# Patient Record
Sex: Female | Born: 1970 | Race: Black or African American | Hispanic: No | Marital: Single | Smoking: Never smoker
Health system: Southern US, Community
[De-identification: ages and names within clinical notes are randomized; demographics above are authoritative.]

## PROBLEM LIST (undated history)

## (undated) DIAGNOSIS — K219 Gastro-esophageal reflux disease without esophagitis: Secondary | ICD-10-CM

## (undated) DIAGNOSIS — I10 Essential (primary) hypertension: Secondary | ICD-10-CM

## (undated) DIAGNOSIS — R51 Headache: Secondary | ICD-10-CM

## (undated) DIAGNOSIS — K449 Diaphragmatic hernia without obstruction or gangrene: Secondary | ICD-10-CM

## (undated) DIAGNOSIS — N6041 Mammary duct ectasia of right breast: Secondary | ICD-10-CM

## (undated) DIAGNOSIS — F329 Major depressive disorder, single episode, unspecified: Secondary | ICD-10-CM

## (undated) DIAGNOSIS — B019 Varicella without complication: Secondary | ICD-10-CM

## (undated) DIAGNOSIS — R7989 Other specified abnormal findings of blood chemistry: Secondary | ICD-10-CM

## (undated) DIAGNOSIS — F32A Depression, unspecified: Secondary | ICD-10-CM

## (undated) DIAGNOSIS — F419 Anxiety disorder, unspecified: Secondary | ICD-10-CM

## (undated) DIAGNOSIS — E229 Hyperfunction of pituitary gland, unspecified: Secondary | ICD-10-CM

## (undated) DIAGNOSIS — D509 Iron deficiency anemia, unspecified: Secondary | ICD-10-CM

## (undated) DIAGNOSIS — E785 Hyperlipidemia, unspecified: Secondary | ICD-10-CM

## (undated) DIAGNOSIS — G4730 Sleep apnea, unspecified: Secondary | ICD-10-CM

## (undated) HISTORY — DX: Mammary duct ectasia of right breast: N60.41

## (undated) HISTORY — DX: Morbid (severe) obesity due to excess calories: E66.01

## (undated) HISTORY — PX: BLADDER SURGERY: SHX569

## (undated) HISTORY — DX: Essential (primary) hypertension: I10

## (undated) HISTORY — DX: Hyperlipidemia, unspecified: E78.5

## (undated) HISTORY — DX: Sleep apnea, unspecified: G47.30

## (undated) HISTORY — DX: Depression, unspecified: F32.A

## (undated) HISTORY — DX: Anxiety disorder, unspecified: F41.9

## (undated) HISTORY — DX: Iron deficiency anemia, unspecified: D50.9

## (undated) HISTORY — PX: MOUTH SURGERY: SHX715

## (undated) HISTORY — DX: Diaphragmatic hernia without obstruction or gangrene: K44.9

## (undated) HISTORY — DX: Other specified abnormal findings of blood chemistry: R79.89

## (undated) HISTORY — DX: Varicella without complication: B01.9

## (undated) HISTORY — DX: Headache: R51

## (undated) HISTORY — DX: Gastro-esophageal reflux disease without esophagitis: K21.9

## (undated) HISTORY — DX: Hyperfunction of pituitary gland, unspecified: E22.9

## (undated) HISTORY — DX: Major depressive disorder, single episode, unspecified: F32.9

---

## 1999-10-06 ENCOUNTER — Other Ambulatory Visit: Admission: RE | Admit: 1999-10-06 | Discharge: 1999-10-06 | Payer: Self-pay | Admitting: Family Medicine

## 2001-09-14 ENCOUNTER — Other Ambulatory Visit: Admission: RE | Admit: 2001-09-14 | Discharge: 2001-09-14 | Payer: Self-pay | Admitting: Obstetrics and Gynecology

## 2002-09-19 ENCOUNTER — Other Ambulatory Visit: Admission: RE | Admit: 2002-09-19 | Discharge: 2002-09-19 | Payer: Self-pay | Admitting: Obstetrics and Gynecology

## 2003-09-25 ENCOUNTER — Other Ambulatory Visit: Admission: RE | Admit: 2003-09-25 | Discharge: 2003-09-25 | Payer: Self-pay | Admitting: Obstetrics and Gynecology

## 2003-11-23 ENCOUNTER — Encounter: Payer: Self-pay | Admitting: Cardiology

## 2004-01-23 ENCOUNTER — Ambulatory Visit (HOSPITAL_BASED_OUTPATIENT_CLINIC_OR_DEPARTMENT_OTHER): Admission: RE | Admit: 2004-01-23 | Discharge: 2004-01-23 | Payer: Self-pay | Admitting: Internal Medicine

## 2004-02-08 ENCOUNTER — Encounter: Admission: RE | Admit: 2004-02-08 | Discharge: 2004-05-08 | Payer: Self-pay | Admitting: Internal Medicine

## 2004-11-14 ENCOUNTER — Other Ambulatory Visit: Admission: RE | Admit: 2004-11-14 | Discharge: 2004-11-14 | Payer: Self-pay | Admitting: Obstetrics and Gynecology

## 2004-12-18 ENCOUNTER — Ambulatory Visit: Payer: Self-pay | Admitting: Internal Medicine

## 2005-04-08 ENCOUNTER — Ambulatory Visit: Payer: Self-pay | Admitting: Internal Medicine

## 2005-04-09 ENCOUNTER — Ambulatory Visit: Payer: Self-pay | Admitting: Pulmonary Disease

## 2005-07-08 ENCOUNTER — Encounter: Payer: Self-pay | Admitting: Pulmonary Disease

## 2005-09-23 ENCOUNTER — Ambulatory Visit: Payer: Self-pay | Admitting: Internal Medicine

## 2005-10-07 ENCOUNTER — Ambulatory Visit: Payer: Self-pay | Admitting: Internal Medicine

## 2005-12-08 ENCOUNTER — Encounter: Payer: Self-pay | Admitting: Internal Medicine

## 2005-12-21 ENCOUNTER — Other Ambulatory Visit: Admission: RE | Admit: 2005-12-21 | Discharge: 2005-12-21 | Payer: Self-pay | Admitting: Obstetrics and Gynecology

## 2006-05-31 ENCOUNTER — Ambulatory Visit: Payer: Self-pay | Admitting: Internal Medicine

## 2006-06-17 ENCOUNTER — Ambulatory Visit: Payer: Self-pay | Admitting: Internal Medicine

## 2006-07-07 ENCOUNTER — Ambulatory Visit: Payer: Self-pay | Admitting: Internal Medicine

## 2006-08-30 ENCOUNTER — Ambulatory Visit: Payer: Self-pay | Admitting: Internal Medicine

## 2006-12-22 ENCOUNTER — Ambulatory Visit: Payer: Self-pay | Admitting: Internal Medicine

## 2006-12-23 ENCOUNTER — Ambulatory Visit: Payer: Self-pay | Admitting: Professional

## 2007-04-12 ENCOUNTER — Ambulatory Visit: Payer: Self-pay | Admitting: Internal Medicine

## 2007-04-12 ENCOUNTER — Observation Stay (HOSPITAL_COMMUNITY): Admission: AD | Admit: 2007-04-12 | Discharge: 2007-04-13 | Payer: Self-pay | Admitting: Internal Medicine

## 2007-04-22 ENCOUNTER — Ambulatory Visit: Payer: Self-pay | Admitting: Internal Medicine

## 2007-07-21 ENCOUNTER — Ambulatory Visit: Payer: Self-pay | Admitting: Internal Medicine

## 2007-10-06 ENCOUNTER — Encounter: Payer: Self-pay | Admitting: Internal Medicine

## 2007-10-06 ENCOUNTER — Ambulatory Visit: Payer: Self-pay | Admitting: Internal Medicine

## 2007-10-06 DIAGNOSIS — F329 Major depressive disorder, single episode, unspecified: Secondary | ICD-10-CM

## 2007-10-06 DIAGNOSIS — R51 Headache: Secondary | ICD-10-CM

## 2007-10-06 DIAGNOSIS — E785 Hyperlipidemia, unspecified: Secondary | ICD-10-CM | POA: Insufficient documentation

## 2007-10-06 DIAGNOSIS — R519 Headache, unspecified: Secondary | ICD-10-CM | POA: Insufficient documentation

## 2007-10-06 DIAGNOSIS — F411 Generalized anxiety disorder: Secondary | ICD-10-CM | POA: Insufficient documentation

## 2007-10-06 DIAGNOSIS — I10 Essential (primary) hypertension: Secondary | ICD-10-CM

## 2007-10-06 LAB — CONVERTED CEMR LAB
AST: 26 units/L (ref 0–37)
Albumin: 3.8 g/dL (ref 3.5–5.2)
BUN: 11 mg/dL (ref 6–23)
Basophils Relative: 0.7 % (ref 0.0–1.0)
Bilirubin Urine: NEGATIVE
Bilirubin, Direct: 0.1 mg/dL (ref 0.0–0.3)
Calcium: 10.1 mg/dL (ref 8.4–10.5)
Creatinine, Ser: 1 mg/dL (ref 0.4–1.2)
Eosinophils Relative: 3.4 % (ref 0.0–5.0)
HCT: 37.3 % (ref 36.0–46.0)
Ketones, ur: NEGATIVE mg/dL
Lymphocytes Relative: 41.4 % (ref 12.0–46.0)
MCHC: 33.7 g/dL (ref 30.0–36.0)
Monocytes Relative: 8.9 % (ref 3.0–11.0)
Neutro Abs: 2.1 10*3/uL (ref 1.4–7.7)
Neutrophils Relative %: 45.6 % (ref 43.0–77.0)
Nitrite: NEGATIVE
Potassium: 3.7 meq/L (ref 3.5–5.1)
RDW: 13.8 % (ref 11.5–14.6)
Sodium: 137 meq/L (ref 135–145)
TSH: 2.7 microintl units/mL (ref 0.35–5.50)
Total Protein, Urine: NEGATIVE mg/dL
Triglycerides: 56 mg/dL (ref 0–149)
Urobilinogen, UA: 0.2 (ref 0.0–1.0)
VLDL: 11 mg/dL (ref 0–40)
WBC: 4.4 10*3/uL — ABNORMAL LOW (ref 4.5–10.5)

## 2007-10-14 ENCOUNTER — Other Ambulatory Visit: Admission: RE | Admit: 2007-10-14 | Discharge: 2007-10-14 | Payer: Self-pay | Admitting: Internal Medicine

## 2007-10-14 ENCOUNTER — Ambulatory Visit: Payer: Self-pay | Admitting: Internal Medicine

## 2007-10-14 ENCOUNTER — Telehealth: Payer: Self-pay | Admitting: Internal Medicine

## 2007-10-14 ENCOUNTER — Encounter: Payer: Self-pay | Admitting: Internal Medicine

## 2007-10-21 ENCOUNTER — Encounter: Payer: Self-pay | Admitting: Internal Medicine

## 2007-12-08 DIAGNOSIS — N6041 Mammary duct ectasia of right breast: Secondary | ICD-10-CM

## 2007-12-08 HISTORY — DX: Mammary duct ectasia of right breast: N60.41

## 2008-05-08 ENCOUNTER — Ambulatory Visit: Payer: Self-pay | Admitting: Internal Medicine

## 2008-05-08 DIAGNOSIS — B029 Zoster without complications: Secondary | ICD-10-CM | POA: Insufficient documentation

## 2008-05-14 ENCOUNTER — Telehealth: Payer: Self-pay | Admitting: Internal Medicine

## 2008-10-01 ENCOUNTER — Telehealth: Payer: Self-pay | Admitting: Internal Medicine

## 2008-10-03 ENCOUNTER — Ambulatory Visit: Payer: Self-pay | Admitting: Internal Medicine

## 2008-10-04 ENCOUNTER — Ambulatory Visit: Payer: Self-pay | Admitting: Internal Medicine

## 2008-10-05 ENCOUNTER — Telehealth: Payer: Self-pay | Admitting: Internal Medicine

## 2008-10-09 ENCOUNTER — Telehealth: Payer: Self-pay | Admitting: Internal Medicine

## 2008-10-11 ENCOUNTER — Ambulatory Visit: Payer: Self-pay | Admitting: Internal Medicine

## 2008-10-11 LAB — CONVERTED CEMR LAB
BUN: 18 mg/dL (ref 6–23)
Calcium: 9.8 mg/dL (ref 8.4–10.5)
Chloride: 103 meq/L (ref 96–112)
Creatinine, Ser: 0.9 mg/dL (ref 0.4–1.2)
GFR calc non Af Amer: 76 mL/min
Glucose, Bld: 92 mg/dL (ref 70–99)

## 2008-10-14 ENCOUNTER — Encounter: Admission: RE | Admit: 2008-10-14 | Discharge: 2008-10-14 | Payer: Self-pay | Admitting: Internal Medicine

## 2008-10-16 ENCOUNTER — Telehealth: Payer: Self-pay | Admitting: Internal Medicine

## 2008-10-16 ENCOUNTER — Encounter: Admission: RE | Admit: 2008-10-16 | Discharge: 2008-10-16 | Payer: Self-pay | Admitting: Internal Medicine

## 2008-10-16 LAB — HM MAMMOGRAPHY

## 2008-11-05 ENCOUNTER — Encounter: Payer: Self-pay | Admitting: Internal Medicine

## 2008-12-04 ENCOUNTER — Telehealth: Payer: Self-pay | Admitting: Internal Medicine

## 2008-12-07 HISTORY — PX: OTHER SURGICAL HISTORY: SHX169

## 2008-12-10 ENCOUNTER — Ambulatory Visit: Payer: Self-pay | Admitting: Internal Medicine

## 2008-12-10 ENCOUNTER — Encounter: Payer: Self-pay | Admitting: Internal Medicine

## 2008-12-10 ENCOUNTER — Other Ambulatory Visit: Admission: RE | Admit: 2008-12-10 | Discharge: 2008-12-10 | Payer: Self-pay | Admitting: Internal Medicine

## 2008-12-17 ENCOUNTER — Telehealth: Payer: Self-pay | Admitting: Internal Medicine

## 2008-12-17 ENCOUNTER — Ambulatory Visit: Payer: Self-pay | Admitting: Internal Medicine

## 2008-12-26 ENCOUNTER — Encounter: Admission: RE | Admit: 2008-12-26 | Discharge: 2008-12-26 | Payer: Self-pay | Admitting: Surgery

## 2008-12-28 ENCOUNTER — Encounter (INDEPENDENT_AMBULATORY_CARE_PROVIDER_SITE_OTHER): Payer: Self-pay | Admitting: Surgery

## 2008-12-28 ENCOUNTER — Ambulatory Visit (HOSPITAL_BASED_OUTPATIENT_CLINIC_OR_DEPARTMENT_OTHER): Admission: RE | Admit: 2008-12-28 | Discharge: 2008-12-28 | Payer: Self-pay | Admitting: Surgery

## 2008-12-31 ENCOUNTER — Telehealth: Payer: Self-pay | Admitting: Internal Medicine

## 2009-02-22 ENCOUNTER — Ambulatory Visit: Payer: Self-pay | Admitting: Internal Medicine

## 2009-02-22 LAB — CONVERTED CEMR LAB
ALT: 24 units/L (ref 0–35)
AST: 28 units/L (ref 0–37)
Alkaline Phosphatase: 62 units/L (ref 39–117)
BUN: 8 mg/dL (ref 6–23)
CO2: 33 meq/L — ABNORMAL HIGH (ref 19–32)
Calcium: 9.9 mg/dL (ref 8.4–10.5)
Creatinine, Ser: 1 mg/dL (ref 0.4–1.2)
Eosinophils Relative: 4.8 % (ref 0.0–5.0)
Hemoglobin, Urine: NEGATIVE
Hemoglobin: 12.7 g/dL (ref 12.0–15.0)
Hgb A1c MFr Bld: 5.8 % (ref 4.6–6.5)
Leukocytes, UA: NEGATIVE
Lymphocytes Relative: 33.9 % (ref 12.0–46.0)
MCHC: 34.8 g/dL (ref 30.0–36.0)
MCV: 87.7 fL (ref 78.0–100.0)
Monocytes Absolute: 0.4 10*3/uL (ref 0.1–1.0)
Monocytes Relative: 8.9 % (ref 3.0–12.0)
Nitrite: NEGATIVE
Potassium: 3.8 meq/L (ref 3.5–5.1)
RBC: 4.15 M/uL (ref 3.87–5.11)
RDW: 13.7 % (ref 11.5–14.6)
Sodium: 140 meq/L (ref 135–145)
pH: 7 (ref 5.0–8.0)

## 2009-02-23 ENCOUNTER — Encounter: Payer: Self-pay | Admitting: Internal Medicine

## 2009-12-12 ENCOUNTER — Ambulatory Visit: Payer: Self-pay | Admitting: Internal Medicine

## 2009-12-12 LAB — CONVERTED CEMR LAB
AST: 29 units/L (ref 0–37)
Alkaline Phosphatase: 53 units/L (ref 39–117)
Bilirubin, Direct: 0.1 mg/dL (ref 0.0–0.3)
HDL: 71.7 mg/dL (ref >39.00)
Total Bilirubin: 0.6 mg/dL (ref 0.3–1.2)
Total Protein: 7.6 g/dL (ref 6.0–8.3)
Triglycerides: 66 mg/dL (ref 0.0–149.0)

## 2009-12-30 ENCOUNTER — Telehealth: Payer: Self-pay | Admitting: Internal Medicine

## 2009-12-31 ENCOUNTER — Encounter: Payer: Self-pay | Admitting: Internal Medicine

## 2009-12-31 ENCOUNTER — Telehealth: Payer: Self-pay | Admitting: Internal Medicine

## 2010-01-15 ENCOUNTER — Encounter: Payer: Self-pay | Admitting: Internal Medicine

## 2010-01-23 ENCOUNTER — Ambulatory Visit (HOSPITAL_COMMUNITY): Admission: RE | Admit: 2010-01-23 | Discharge: 2010-01-23 | Payer: Self-pay | Admitting: General Surgery

## 2010-02-10 ENCOUNTER — Ambulatory Visit: Payer: Self-pay | Admitting: Internal Medicine

## 2010-02-13 ENCOUNTER — Encounter: Payer: Self-pay | Admitting: Internal Medicine

## 2010-03-03 ENCOUNTER — Encounter: Admission: RE | Admit: 2010-03-03 | Discharge: 2010-06-01 | Payer: Self-pay | Admitting: General Surgery

## 2010-03-04 ENCOUNTER — Ambulatory Visit: Payer: Self-pay | Admitting: Internal Medicine

## 2010-03-04 ENCOUNTER — Other Ambulatory Visit: Admission: RE | Admit: 2010-03-04 | Discharge: 2010-03-04 | Payer: Self-pay | Admitting: Internal Medicine

## 2010-03-04 LAB — CONVERTED CEMR LAB: Pap Smear: NEGATIVE

## 2010-03-17 ENCOUNTER — Telehealth: Payer: Self-pay | Admitting: Internal Medicine

## 2010-03-21 ENCOUNTER — Encounter: Payer: Self-pay | Admitting: Internal Medicine

## 2010-04-06 HISTORY — PX: OTHER SURGICAL HISTORY: SHX169

## 2010-04-14 ENCOUNTER — Telehealth: Payer: Self-pay | Admitting: Internal Medicine

## 2010-04-15 ENCOUNTER — Ambulatory Visit (HOSPITAL_COMMUNITY): Admission: RE | Admit: 2010-04-15 | Discharge: 2010-04-16 | Payer: Self-pay | Admitting: Surgery

## 2010-05-20 ENCOUNTER — Encounter: Payer: Self-pay | Admitting: Internal Medicine

## 2010-06-17 ENCOUNTER — Encounter: Admission: RE | Admit: 2010-06-17 | Discharge: 2010-08-18 | Payer: Self-pay | Admitting: General Surgery

## 2010-07-07 ENCOUNTER — Ambulatory Visit: Payer: Self-pay | Admitting: Internal Medicine

## 2010-08-18 ENCOUNTER — Ambulatory Visit: Payer: Self-pay | Admitting: Internal Medicine

## 2010-08-18 LAB — CONVERTED CEMR LAB
Cholesterol: 166 mg/dL
HDL: 60.5 mg/dL
LDL Cholesterol: 95 mg/dL
Total CHOL/HDL Ratio: 3
Triglycerides: 51 mg/dL
VLDL: 10.2 mg/dL

## 2010-08-19 ENCOUNTER — Encounter: Payer: Self-pay | Admitting: Internal Medicine

## 2010-09-14 ENCOUNTER — Emergency Department (HOSPITAL_COMMUNITY): Admission: EM | Admit: 2010-09-14 | Discharge: 2010-09-14 | Payer: Self-pay | Admitting: Emergency Medicine

## 2010-11-17 ENCOUNTER — Encounter
Admission: RE | Admit: 2010-11-17 | Discharge: 2011-01-06 | Payer: Self-pay | Source: Home / Self Care | Attending: General Surgery | Admitting: General Surgery

## 2010-12-11 ENCOUNTER — Encounter: Payer: Self-pay | Admitting: Internal Medicine

## 2010-12-29 ENCOUNTER — Other Ambulatory Visit: Payer: Self-pay | Admitting: Internal Medicine

## 2010-12-29 ENCOUNTER — Ambulatory Visit
Admission: RE | Admit: 2010-12-29 | Discharge: 2010-12-29 | Payer: Self-pay | Source: Home / Self Care | Attending: Internal Medicine | Admitting: Internal Medicine

## 2010-12-29 LAB — CBC WITH DIFFERENTIAL/PLATELET
Basophils Absolute: 0 10*3/uL (ref 0.0–0.1)
Basophils Relative: 0.3 % (ref 0.0–3.0)
Eosinophils Absolute: 0.1 10*3/uL (ref 0.0–0.7)
HCT: 35.2 % — ABNORMAL LOW (ref 36.0–46.0)
Lymphs Abs: 1.5 10*3/uL (ref 0.7–4.0)
MCV: 89.9 fl (ref 78.0–100.0)
Monocytes Relative: 8.1 % (ref 3.0–12.0)
Neutro Abs: 1.6 10*3/uL (ref 1.4–7.7)
Neutrophils Relative %: 46.7 % (ref 43.0–77.0)
Platelets: 171 10*3/uL (ref 150.0–400.0)
RBC: 3.91 Mil/uL (ref 3.87–5.11)
WBC: 3.5 10*3/uL — ABNORMAL LOW (ref 4.5–10.5)

## 2010-12-29 LAB — URINALYSIS
Ketones, ur: NEGATIVE
Nitrite: NEGATIVE
Specific Gravity, Urine: >=1.03 (ref 1.000–1.030)
Total Protein, Urine: NEGATIVE
Urine Glucose: NEGATIVE
pH: 5.5 (ref 5.0–8.0)

## 2010-12-29 LAB — HEPATIC FUNCTION PANEL
ALT: 15 U/L (ref 0–35)
Albumin: 3.7 g/dL (ref 3.5–5.2)
Alkaline Phosphatase: 36 U/L — ABNORMAL LOW (ref 39–117)
Bilirubin, Direct: 0.1 mg/dL (ref 0.0–0.3)
Total Bilirubin: 0.6 mg/dL (ref 0.3–1.2)
Total Protein: 6.3 g/dL (ref 6.0–8.3)

## 2010-12-29 LAB — LIPID PANEL
Cholesterol: 189 mg/dL (ref 0–200)
HDL: 69.4 mg/dL (ref >39.00)
Total CHOL/HDL Ratio: 3
Triglycerides: 26 mg/dL (ref 0.0–149.0)

## 2010-12-29 LAB — BASIC METABOLIC PANEL
BUN: 16 mg/dL (ref 6–23)
Calcium: 10.3 mg/dL (ref 8.4–10.5)
GFR: 92.86 mL/min (ref >60.00)
Glucose, Bld: 84 mg/dL (ref 70–99)
Potassium: 3.8 mEq/L (ref 3.5–5.1)

## 2010-12-29 LAB — TSH: TSH: 1.52 u[IU]/mL (ref 0.35–5.50)

## 2011-01-01 ENCOUNTER — Ambulatory Visit
Admission: RE | Admit: 2011-01-01 | Discharge: 2011-01-01 | Payer: Self-pay | Source: Home / Self Care | Attending: Internal Medicine | Admitting: Internal Medicine

## 2011-01-01 LAB — CONVERTED CEMR LAB
Cholesterol, target level: 200 mg/dL
LDL Goal: 160 mg/dL

## 2011-01-04 LAB — CONVERTED CEMR LAB

## 2011-01-06 NOTE — Letter (Signed)
   [facility] [address] [place], [place]  [address] Phone: [phone]      August 19, 2010   [facility] [address] [place], [place] [address]  RE:  LAB RESULTS  Dear  Ms. NAAS,  The following is an interpretation of your most recent lab tests.  Please take note of any instructions provided or changes to medications that have resulted from your lab work.  LIPID PANEL:  Good - no changes needed Triglyceride: 51.0   Cholesterol: 166   LDL: 95   HDL: 60.50   Chol/HDL%:  3     Great lipid panel even off medication. KEEP UP THE GOOD WORK!!!!   Sincerely Yours,    Jacques Navy MD

## 2011-01-06 NOTE — Assessment & Plan Note (Signed)
Summary: fu--d/t---stc   Vital Signs:  Patient profile:   40 year old female Height:      66 inches Weight:      274 pounds BMI:     44.38 O2 Sat:      97 % on Room air Temp:     98.3 degrees F oral Pulse rate:   67 / minute BP sitting:   112 / 64  (left arm) Cuff size:   large  Vitals Entered By: Bill Salinas CMA (July 07, 2010 3:07 PM)  O2 Flow:  Room air CC: follow-up visit, pt states she is taking 40mg  of the lexapro/ab   Primary Care Provider:  Nelly Scriven  CC:  follow-up visit and pt states she is taking 40mg  of the lexapro/ab.  History of Present Illness: Patient had lap-band surgery in May '11 and has lost 41 lbs since March. She is feeling well. she is interested to know if she can stop any medications. She has missed doses of reglan and done well. she has missed doses of prilosec and done OK.  she is currently taking lexapro 40mg  once daily but it is getting expensive. she is interested in trying generic celexa.  Current Medications (verified): 1)  Hydrochlorothiazide 25 Mg Tabs (Hydrochlorothiazide) .... Take 1 Tablet By Mouth Once A Day 2)  Lexapro 20 Mg Tabs (Escitalopram Oxalate) .Marland Kitchen.. 1 By Mouth Once Daily 3)  Lisinopril 10 Mg Tabs (Lisinopril) .... Take 1 Tablet By Mouth Once A Day 4)  Lovastatin 40 Mg Tabs (Lovastatin) .... Take 1 Tablet By Mouth Once A Day 5)  Phendimetrazine Tartrate 35 Mg Tabs (Phendimetrazine Tartrate) .... 2 in Am, 1 Pm 6)  Metoclopramide Hcl 10 Mg Tabs (Metoclopramide Hcl) .... Take 1 Tablet By Mouth Two Times A Day 7)  Prilosec Otc 20 Mg Tbec (Omeprazole Magnesium) .Marland Kitchen.. 1 By Mouth Qam 8)  Fluticasone Propionate 50 Mcg/act Susp (Fluticasone Propionate) .Marland Kitchen.. 1 Spray Each Nostril  Every Morning  Allergies (verified): 1)  * Contrast Dye  Past History:  Past Medical History: Last updated: 02/10/2010 chicken pox Anxiety Depression Headache Hyperlipidemia Hypertension morbid obesity  physician roster: Dr. Wall-cardiology  Past  Surgical History: bladder surgery - dhildhood lapband procedure May '11 ([facility]) PSH reviewed for relevance, FH reviewed for relevance  Family History: Reviewed history from 10/06/2007 and no changes required. mother-arthritis Paternal great-aunt with breast cancer. parents with HTN, lipids grandparent with DM  Social History: Reviewed history from 02/22/2009 and no changes required. 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  The patient denies anorexia, fever, weight gain, hoarseness, chest pain, abdominal pain, severe indigestion/heartburn, and muscle weakness.    Physical Exam  General:  0verweight but much reduced AA female in no distress Head:  Normocephalic and atraumatic without obvious abnormalities. No apparent alopecia or balding. Eyes:  pupils equal, pupils round, corneas and lenses clear, and no optic disk abnormalities.   Ears:  R ear normal and L ear normal.   Mouth:  good dentition.   Neck:  supple, full ROM, no thyromegaly, and no carotid bruits.   Lungs:  Normal respiratory effort, chest expands symmetrically. Lungs are clear to auscultation, no crackles or wheezes. Heart:  Normal rate and regular rhythm. S1 and S2 normal without gallop, murmur, click, rub or other extra sounds. Abdomen:  soft, non-tender, and  normal bowel sounds.  WEell healed surgical scars. Port is stable.  Msk:  normal ROM and no joint tenderness.   Pulses:  2+ radial pulse Neurologic:  alert & oriented X3, cranial nerves II-XII intact, strength normal in all extremities, sensation intact to light touch, and gait normal.     Impression & Recommendations:  Problem # 1:  MORBID OBESITY (ICD-278.01) Patient s/p lap band procedure with 41 lb weight loss since surgery in May.  Plan - continue to watch diet  Problem  # 2:  HYPERTENSION (ICD-401.9) Patinets BP is much better controlled.  Plan - continue HCTZ           d/c lisinopril           f/u BP   The following medications were removed from the medication list:    Lisinopril 10 Mg Tabs (Lisinopril) .Marland Kitchen... Take 1 tablet by mouth once a day Her updated medication list for this problem includes:    Hydrochlorothiazide 25 Mg Tabs (Hydrochlorothiazide) .Marland Kitchen... Take 1 tablet by mouth once a day  Problem # 3:  HYPERLIPIDEMIA (ICD-272.4)  Patient cholesterol level is great!   The following medications were removed from the medication list:    Lovastatin 40 Mg Tabs (Lovastatin) .Marland Kitchen... Take 1 tablet by mouth once a day  Labs Reviewed: SGOT: 29 (12/12/2009)   SGPT: 21 (12/12/2009)   HDL:71.70 (12/12/2009), 64.3 (10/06/2007)  LDL:80 (12/12/2009), 84 (10/06/2007)  Chol:165 (12/12/2009), 159 (10/06/2007)  Trig:66.0 (12/12/2009), 56 (10/06/2007)  Plan - trial off lovastatin           f/u lab in 4 weeks   Problem # 4:  DEPRESSION (ICD-311) Patient seems to be doing well.  Plan - change to citalopram 40mg  daily.  Her updated medication list for this problem includes:    Citalopram Hydrobromide 40 Mg Tabs (Citalopram hydrobromide) .Marland Kitchen... 1 by mouth once daily  Complete Medication List: 1)  Hydrochlorothiazide 25 Mg Tabs (Hydrochlorothiazide) .... Take 1 tablet by mouth once a day 2)  Citalopram Hydrobromide 40 Mg Tabs (Citalopram hydrobromide) .Marland Kitchen.. 1 by mouth once daily 3)  Prilosec Otc 20 Mg Tbec (Omeprazole magnesium) .Marland Kitchen.. 1 by mouth qam as needed 4)  Fluticasone Propionate 50 Mcg/act Susp (Fluticasone propionate) .Marland Kitchen.. 1 spray each nostril  every morning  Patient Instructions: 1)  weight manage - STRONG WORK. KEEP IT UP 2)  Blood pressure - much better. OK to hold lisinopril. Continue HCTZ 3)  Cholesterol management - hold the lovastatin. Repeat lab in 4 weeks 4)  Psychologic - will convert to citalopram 40mg  once daily  Prescriptions: CITALOPRAM  HYDROBROMIDE 40 MG TABS (CITALOPRAM HYDROBROMIDE) 1 by mouth once daily  #30 x 12   Entered and Authorized by:   Jacques Navy MD   Signed by:   Jacques Navy MD on 07/08/2010   Method used:   [facility] [address] [phone]* (retail)       [address]       [place], [place]  [address]       Ph: [phone] or [phone]       Fax: [phone]   RxID:   [id number]

## 2011-01-06 NOTE — Letter (Signed)
Summary: [facility]   Imported By: Sherian Rein 02/03/2010 12:00:26  _____________________________________________________________________  External Attachment:    Type:   Image     Comment:   External Document

## 2011-01-06 NOTE — Progress Notes (Signed)
Summary: LEXAPRO INCREASE  Phone Note Call from Patient Call back at [facility] Phone [phone]   Summary of Call: At last office visit pt says MD agreed she could take increased dose of lexapro, 20mg  daily. She is [facility] [address]   Pt only needs return call if this is a problem. Initial call taken by: Lamar Sprinkles, CMA,  March 17, 2010 4:50 PM  Follow-up for Phone Call        ok to Rx lexapro 20mg  1 once daily, #30  refill x 12 Follow-up by: Jacques Navy MD,  March 18, 2010 5:46 AM  Additional Follow-up for Phone Call Additional follow up Details #1::        You wrote lexapro but in EMR put celexa? Pt wants Lexapro 20, ok?  Additional Follow-up by: Lamar Sprinkles, CMA,  March 18, 2010 7:59 AM    Additional Follow-up for Phone Call Additional follow up Details #2::    When that is entered the state health plan requires substitution of citalopram. Lexapro is escitalopram. If she is willing to pay for brand name then it will be lexapro 20 Follow-up by: Jacques Navy MD,  March 18, 2010 8:32 AM  Additional Follow-up for Phone Call Additional follow up Details #3:: Details for Additional Follow-up Action Taken: Pt has never tried celexa. She says insurance covers lexapro w/o any problems. Will send in rx for lexapro, pt aware if this is not covered then must try celexa. Additional Follow-up by: Lamar Sprinkles, CMA,  March 18, 2010 8:49 AM  New/Updated Medications: CITALOPRAM HYDROBROMIDE 20 MG TABS (CITALOPRAM HYDROBROMIDE) 1 by mouth q d LEXAPRO 20 MG TABS (ESCITALOPRAM OXALATE) 1 by mouth once daily Prescriptions: LEXAPRO 20 MG TABS (ESCITALOPRAM OXALATE) 1 by mouth once daily  #90 x 1   Entered by:   Lamar Sprinkles, CMA   Authorized by:   Jacques Navy MD   Signed by:   Lamar Sprinkles, CMA on 03/18/2010   Method used:   [facility]        [facility]  [address] [phone]* (retail)       [address]       [place], [place]  [id number]       Ph: [phone] or [phone]       Fax: [phone]   RxID:   [id number]

## 2011-01-06 NOTE — Assessment & Plan Note (Signed)
Summary: PHYSICAL--STC   Vital Signs:  Patient profile:   40 year old female Height:      66 inches Weight:      300 pounds BMI:     48.60 O2 Sat:      96 % on Room air Temp:     98.4 degrees F oral Pulse rate:   104 / minute Resp:     12 per minute BP sitting:   124 / 80  O2 Flow:  Room air  Primary Care Provider:  Angelys Tucker   History of Present Illness: Patient presents for routine exam.  In the interval since her last visit she has had a persistent sinus infection requiring antibiotics; she had a benign fibroma excised from the left breast; a gum infection rquiring oral surgery; C. Difficile colitis. Through all this she has been going to the Bariatric clinic where she has been coached on weight loss and prescribed phentermine. She has continued to workout.She did loose 40lbs but with the intercurrent illness she did gain it all back. At this time she is interested in persuing lap-band surgery and is signed up for the Jan 11th educational session at [facility].    Allergies: 1)  * Contrast Dye  Past History:  Past Medical History: Last updated: 02/22/2009 chicken pox Anxiety Depression Headache Hyperlipidemia Hypertension morbid obesity physician roster: Dr. Wall-cardiology  Past Surgical History: Last updated: 10/06/2007 bladder surgery - dhildhood  Family History: Last updated: 10/06/2007 mother-arthritis Paternal great-aunt with breast cancer. 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  The patient denies anorexia, fever, weight loss, decreased  hearing, hoarseness, chest pain, dyspnea on exertion, peripheral edema, prolonged cough, hemoptysis, abdominal pain, hematochezia, hematuria, incontinence, muscle weakness, suspicious skin lesions, difficulty walking, unusual weight change, abnormal bleeding, and angioedema.    Physical Exam  General:  overweight AA female in no distress Head:  Normocephalic and atraumatic without obvious abnormalities. No apparent alopecia or balding. Eyes:  No corneal or conjunctival inflammation noted. EOMI. Perrla. Funduscopic exam benign, without hemorrhages, exudates or papilledema. Vision grossly normal. Ears:  External ear exam shows no significant lesions or deformities.  Otoscopic examination reveals clear canals, tympanic membranes are intact bilaterally without bulging, retraction, inflammation or discharge. Hearing is grossly normal bilaterally. Nose:  External nasal examination shows no deformity or inflammation. Nasal mucosa are pink and moist without lesions or exudates. Mouth:  Oral mucosa and oropharynx without lesions or exudates.  Teeth in good repair. Neck:  supple, full ROM, no thyromegaly, and no carotid bruits.   Chest Wall:  no tenderness.   Breasts:  No mass, nodules, thickening, tenderness, bulging, retraction, inflamation, nipple discharge or skin changes noted.  Minor fibrocsystic changes both breasts noted Lungs:  Normal respiratory effort, chest expands symmetrically. Lungs are clear to auscultation, no crackles or wheezes. Heart:  Normal rate and regular rhythm. S1 and S2 normal without gallop, murmur, click, rub or other extra sounds. Abdomen:  soft, non-tender, normal bowel sounds, and no guarding.   Genitalia:  deferred Msk:  normal ROM, no joint tenderness, no joint swelling, no joint warmth, no redness over joints, and no joint  instability.   Pulses:  2+ radial and DP Extremities:  No clubbing, cyanosis, edema, or deformity noted with normal full range of motion of all joints.    Neurologic:  No cranial nerve deficits noted. Station and gait are normal. Plantar reflexes are down-going bilaterally. DTRs are symmetrical throughout. Sensory, motor and coordinative functions appear intact. Skin:  turgor normal, color normal, no rashes, no suspicious lesions, no purpura, and no ulcerations.   Cervical Nodes:  no anterior cervical adenopathy and no posterior cervical adenopathy.   Axillary Nodes:  no R axillary adenopathy and no L axillary adenopathy.   Psych:  Oriented X3, memory intact for recent and remote, normally interactive, good eye contact, and not anxious appearing.     Impression & Recommendations:  Problem # 1:  MORBID OBESITY (ICD-278.01) Patient is encouraged to attend [facility] information session and to pursue lap band surgery. She is a good surgical candidate with no contra-indications to surgery or anesthesia.  Problem # 2:  HYPERTENSION (ICD-401.9)  Her updated medication list for this problem includes:    Hydrochlorothiazide 25 Mg Tabs (Hydrochlorothiazide) .Marland Kitchen... Take 1 tablet by mouth once a day    Lisinopril 10 Mg Tabs (Lisinopril) .Marland Kitchen... Take 1 tablet by mouth once a day  BP today: 124/80 Prior BP: 116/82 (02/22/2009)  Good control. Plan is to continue present medications.   Problem # 3:  HYPERLIPIDEMIA (ICD-272.4)  Her updated medication list for this problem includes:    Lovastatin 40 Mg Tabs (Lovastatin) .Marland Kitchen... Take 1 tablet by mouth once a day  Orders: TLB-Lipid Panel (80061-LIPID) TLB-Hepatic/Liver Function Pnl (80076-HEPATIC)  Addendum - LDL 80, HDL 71.7 - excellent results.  Plan - continue present medications.   Problem # 4:  ANXIETY (ICD-300.00) Stable and doing well on present medications dispite an increased work load.  Her updated medication list for this problem includes:    Lexapro 10 Mg Tabs (Escitalopram oxalate) .Marland Kitchen... Take 1 tablet by mouth once a day  Problem # 5:  Preventive Health Care (ICD-V70.0) History as noted with  a good recovery from all the problems listed. Her exam today is normal including a normal breast exam. He lab works is excellent. She will be returning for Pelvic and PAP.  In summary - a pleasant woman who is determined to take action to control her weight problem.  Complete Medication List: 1)  Hydrochlorothiazide 25 Mg Tabs (Hydrochlorothiazide) .... Take 1 tablet by mouth once a day 2)  Lexapro 10 Mg Tabs (Escitalopram oxalate) .... Take 1 tablet by mouth once a day 3)  Lisinopril 10 Mg Tabs (Lisinopril) .... Take 1 tablet by mouth once a day 4)  Lovastatin 40 Mg Tabs (Lovastatin) .... Take 1 tablet by mouth once a day 5)  Phendimetrazine Tartrate 35 Mg Tabs (Phendimetrazine tartrate) .... 2 in am, 1 pm 6)  Metoclopramide Hcl 10 Mg Tabs (Metoclopramide hcl) .... Take 1 tablet by mouth two times a day 7)  Prilosec Otc 20 Mg Tbec (Omeprazole magnesium) .Marland Kitchen.. 1 by mouth qam  Patient: Katherine Tucker Note: All result statuses are Final unless otherwise noted.  Tests: (1) Lipid Panel (LIPID)   Cholesterol               165 mg/dL                   1-610     ATP III Classification            Desirable:  < 200 mg/dL  Borderline High:  200 - 239 mg/dL               High:  > = 240 mg/dL   Triglycerides             66.0 mg/dL                  1.6-109.6     Normal:  <150 mg/dL     Borderline High:  045 - 199 mg/dL   HDL                       40.98 mg/dL                 >11.91   VLDL Cholesterol          13.2 mg/dL                  4.7-82.9   LDL Cholesterol           80 mg/dL                    5-62  CHO/HDL Ratio:  CHD Risk                             2                    Men          Women     1/2 Average Risk     3.4          3.3     Average Risk          5.0          4.4     2X Average Risk          9.6          7.1     3X Average Risk          15.0          11.0                           Tests: (2) Hepatic/Liver Function Panel (HEPATIC)   Total Bilirubin            0.6 mg/dL                   1.3-0.8   Direct Bilirubin          0.1 mg/dL                   6.5-7.8   Alkaline Phosphatase      53 U/L                      39-117   AST                       29 U/L                      0-37   ALT                       21 U/L                      0-35   Total Protein  7.6 g/dL                    5.6-2.1   Albumin                   3.7 g/dL                    [id number]: PRILOSEC OTC 20 MG TBEC (OMEPRAZOLE MAGNESIUM) 1 by mouth qAM  #30 x 12   Entered and Authorized by:   Jacques Navy MD   Signed by:   Jacques Navy MD on 12/12/2009   Method used:   [facility]        [facility] [address] [phone]* (retail)       [address]       [place], [place]  [id number]       Ph: [phone] or [phone]       Fax: [phone]   RxID:   [id number] METOCLOPRAMIDE HCL 10 MG TABS (METOCLOPRAMIDE HCL) Take 1 tablet by mouth two times a day  #60 x PRN   Entered and Authorized by:   Jacques Navy MD   Signed by:   Jacques Navy MD on 12/12/2009   Method used:   Electronically to        [facility] [address] [phone]* (retail)       [address]       [place], [place]  [address]       Ph: [phone] or [phone]       Fax: [phone]   RxID:   [id number] LOVASTATIN 40 MG TABS (LOVASTATIN) Take 1 tablet by mouth once a day  #30 Tablet x 12   Entered and Authorized by:   Jacques Navy MD   Signed by:   Jacques Navy MD on 12/12/2009   Method used:   Electronically to        [facility] [address] [phone]* (retail)       [address]       [place], [place]  [address]       Ph: [phone] or [phone]       Fax: [phone]   RxID:   [id number] LISINOPRIL 10 MG TABS (LISINOPRIL) Take 1 tablet by mouth once a day  #30 Tablet x 5   Entered and Authorized by:   Jacques Navy MD   Signed by:   Jacques Navy MD  on 12/12/2009   Method used:   Electronically to        [facility]  [address] [phone]* (retail)       [address]       [place], [place]  [address]       Ph: [phone] or [phone]       Fax: [phone]   RxID:   [id number] LEXAPRO 10 MG TABS (ESCITALOPRAM OXALATE) Take 1 tablet by mouth once a day  #30 x 12   Entered and Authorized by:   Jacques Navy MD   Signed by:   Jacques Navy MD on 12/12/2009   Method used:   Electronically to        [facility]  [address] [phone]* (retail)       [address]       [place], [place]  [address]       Ph: [phone] or [phone]       Fax: [phone]   RxID:   [id number] HYDROCHLOROTHIAZIDE 25 MG TABS (HYDROCHLOROTHIAZIDE) Take 1 tablet by mouth once a day  #30 x 12   Entered and Authorized by:   Jacques Navy MD   Signed by:   Jacques Navy MD on 12/12/2009   Method used:   Electronically to        [facility] [address] [phone]* (retail)       [address]       [place], [place]  [address]       Ph: [phone] or [phone]       Fax: [phone]   RxID:   [id number]

## 2011-01-06 NOTE — Letter (Signed)
Summary: [facility]  [facility]   Imported By: Sherian Rein 02/24/2010 10:01:19  _____________________________________________________________________  External Attachment:    Type:   Image     Comment:   External Document

## 2011-01-06 NOTE — Letter (Signed)
Summary: [facility]   Imported By: Sherian Rein 06/04/2010 09:36:54  _____________________________________________________________________  External Attachment:    Type:   Image     Comment:   External Document

## 2011-01-06 NOTE — Progress Notes (Signed)
  Phone Note Other Incoming   Caller: pt Summary of Call: pt calling and states that she is having beariatric surg. in the morning at 7:15 at [facility], just an FYI Initial call taken by: Ami Bullins CMA,  Apr 14, 2010 11:10 AM

## 2011-01-06 NOTE — Progress Notes (Signed)
Summary: Paperwork  Phone Note Call from Patient Call back at [facility] Phone [phone]   Caller: Patient Call For: Jacques Navy MD Summary of Call: Patient dropped off a form to be completed by Dr. Debby Bud. Gave to [employer]. Initial call taken by: Irma Newness,  December 30, 2009 3:21 PM  Follow-up for Phone Call        form is complete and awaiting MD signature. **Will order chart to get more weights and look for sleep study. Follow-up by: Lucious Groves,  December 30, 2009 4:19 PM

## 2011-01-06 NOTE — Letter (Signed)
   [facility] [address] [place], [place]  [address] Phone: [phone]      March 21, 2010   [facility] [address] [place], [place] [address]  RE:  LAB RESULTS  Dear  Ms. ILLESCAS,  The following is an interpretation of your most recent lab tests.  Please take note of any instructions provided or changes to medications that have resulted from your lab work.  Pap Smear: normal      Normal study.   Sincerely Yours,    Jacques Navy MD

## 2011-01-06 NOTE — Assessment & Plan Note (Signed)
Summary: cold,cough,sore throat/men pt/cd   Vital Signs:  Patient profile:   40 year old female Height:      66 inches (167.64 cm) Weight:      300 pounds (136.36 kg) O2 Sat:      96 % on Room air Temp:     98.2 degrees F (36.78 degrees C) oral Pulse rate:   90 / minute BP sitting:   112 / 74  (left arm) Cuff size:   large  Vitals Entered By: Orlan Leavens (February 10, 2010 10:18 AM)  O2 Flow:  Room air CC: Head congestion/ cough/ sore throat x's 2 weeks, URI symptoms Is Patient Diabetic? No Pain Assessment Patient in pain? no        Primary Care Provider:  Norins  CC:  Head congestion/ cough/ sore throat x's 2 weeks and URI symptoms.  History of Present Illness:  URI Symptoms      This is a 40 year old woman who presents with URI symptoms.  The symptoms began 2 weeks ago.  The severity is described as moderate.  tried OTC mucinex, sudafed and benadryl with min relief. waxing and waning symptoms over past 2 weeks.  The patient reports nasal congestion, sore throat, earache, and sick contacts, but denies clear nasal discharge and productive cough.  Associated symptoms include low-grade fever (<100.5 degrees).  The patient denies stiff neck, dyspnea, vomiting, and use of an antipyretic.  The patient also reports itchy throat, response to antihistamine, headache, and severe fatigue.  The patient denies sneezing.  The patient denies the following risk factors for Strep sinusitis: tooth pain and tender adenopathy.    Current Medications (verified): 1)  Hydrochlorothiazide 25 Mg Tabs (Hydrochlorothiazide) .... Take 1 Tablet By Mouth Once A Day 2)  Lexapro 10 Mg Tabs (Escitalopram Oxalate) .... Take 1 Tablet By Mouth Once A Day 3)  Lisinopril 10 Mg Tabs (Lisinopril) .... Take 1 Tablet By Mouth Once A Day 4)  Lovastatin 40 Mg Tabs (Lovastatin) .... Take 1 Tablet By Mouth Once A Day 5)  Phendimetrazine Tartrate 35 Mg Tabs (Phendimetrazine Tartrate) .... 2 in Am, 1 Pm 6)  Metoclopramide Hcl  10 Mg Tabs (Metoclopramide Hcl) .... Take 1 Tablet By Mouth Two Times A Day 7)  Prilosec Otc 20 Mg Tbec (Omeprazole Magnesium) .Marland Kitchen.. 1 By Mouth Qam  Allergies (verified): 1)  * Contrast Dye  Past History:  Past Medical History: chicken pox Anxiety Depression Headache Hyperlipidemia Hypertension morbid obesity  physician roster: Dr. Wall-cardiology  Review of Systems  The patient denies syncope, hemoptysis, and abdominal pain.    Physical Exam  General:  overweight AA female in no distress- mildly ill with hoarse voice quality Eyes:  vision grossly intact; pupils equal, round and reactive to light.  conjunctiva and lids normal.    Ears:  R ear with clear mild effusion behind TM, L ear with mod effusion., hazy TM and erythema - tender with exam Nose:  External nasal examination shows no deformity or inflammation. Nasal mucosa are pink and moist without lesions or exudates. Mouth:  teeth and gums in good repair; mucous membranes moist, without lesions or ulcers. oropharynx clear without exudate, min erythema. +PND Neck:  supple, full ROM, no masses, no thyromegaly; no thyroid nodules or tenderness. no JVD or carotid bruits.   Lungs:  normal respiratory effort, no intercostal retractions or use of accessory muscles; normal breath sounds bilaterally - no crackles and no wheezes.    Heart:  normal rate, regular  rhythm, no murmur, and no rub. BLE without edema.    Impression & Recommendations:  Problem # 1:  SINUSITIS (ICD-473.9)  Her updated medication list for this problem includes:    Augmentin 875-125 Mg Tabs (Amoxicillin-pot clavulanate) .Marland Kitchen... 1 by mouth two times a day x 7days    Fluticasone Propionate 50 Mcg/act Susp (Fluticasone propionate) .Marland Kitchen... 1 spray each nostril  every morning  Take antibiotics for full duration. Discussed treatment options   Orders: Prescription Created Electronically [phone])  Problem # 2:  OTITIS MEDIA, LEFT (ICD-382.9)  Her updated  medication list for this problem includes:    Augmentin 875-125 Mg Tabs (Amoxicillin-pot clavulanate) .Marland Kitchen... 1 by mouth two times a day x 7days  Instructed on prevention and treatment. Call if no improvement in 48-72 hours or sooner if worsening symptoms.   Complete Medication List: 1)  Hydrochlorothiazide 25 Mg Tabs (Hydrochlorothiazide) .... Take 1 tablet by mouth once a day 2)  Lexapro 10 Mg Tabs (Escitalopram oxalate) .... Take 1 tablet by mouth once a day 3)  Lisinopril 10 Mg Tabs (Lisinopril) .... Take 1 tablet by mouth once a day 4)  Lovastatin 40 Mg Tabs (Lovastatin) .... Take 1 tablet by mouth once a day 5)  Phendimetrazine Tartrate 35 Mg Tabs (Phendimetrazine tartrate) .... 2 in am, 1 pm 6)  Metoclopramide Hcl 10 Mg Tabs (Metoclopramide hcl) .... Take 1 tablet by mouth two times a day 7)  Prilosec Otc 20 Mg Tbec (Omeprazole magnesium) .Marland Kitchen.. 1 by mouth qam 8)  Augmentin 875-125 Mg Tabs (Amoxicillin-pot clavulanate) .Marland Kitchen.. 1 by mouth two times a day x 7days 9)  Fluticasone Propionate 50 Mcg/act Susp (Fluticasone propionate) .Marland Kitchen.. 1 spray each nostril  every morning  Patient Instructions: 1)  it was good to see you today. 2)  antibiotics - augmentin and nasal steroid spray as discussed - your prescriptions have been electronically submitted to your pharmacy. Please take as directed. Contact our office if you believe you're having problems with the medication(s).  3)  Get plenty of rest, drink lots of clear liquids, and use Tylenol or Ibuprofen for fever and comfort. Return in 7-10 days if you're not better:sooner if you're feeling worse. 4)  Continue using Mucinex and Sudafed for other symptoms relief as you are doing Prescriptions: FLUTICASONE PROPIONATE 50 MCG/ACT SUSP (FLUTICASONE PROPIONATE) 1 spray each nostril  every morning  #1 x 1   Entered and Authorized by:   Newt Lukes MD   Signed by:   Newt Lukes MD on 02/10/2010   Method used:   [facility]ically to        [facility]   [address] [phone]* (retail)       [address]       [place], [place]  [id number]       Ph: [phone] or [phone]       Fax: [phone]   RxID:   [id number] AUGMENTIN 875-125 MG TABS (AMOXICILLIN-POT CLAVULANATE) 1 by mouth two times a day x 7days  #14 x 0   Entered and Authorized by:   Newt Lukes MD   Signed by:   Newt Lukes MD on 02/10/2010   Method used:   [facility]        [facility]  [address] [phone]* (retail)       [address]       [place], [place]  [id number]       Ph: [phone] or [phone]       Fax: [phone]   RxID:   [id number]

## 2011-01-06 NOTE — Assessment & Plan Note (Signed)
Summary: FU--STC   Vital Signs:  Patient profile:   40 year old female Height:      66 inches (167.64 cm) Weight:      315 pounds (143.18 kg) BMI:     51.03 O2 Sat:      97 % on Room air Temp:     98.8 degrees F (37.11 degrees C) oral Pulse rate:   87 / minute Pulse rhythm:   regular BP sitting:   98 / 42  (left arm) Cuff size:   large  Vitals Entered By: Brenton Grills (March 04, 2010 3:21 PM)  O2 Flow:  Room air CC: f/u appt/pt saw Dr. Bayard Hugger on 3/47for cold/cough/sore throat/question about Lexapro/aj   Primary Care Provider:  Jairus Tonne  CC:  f/u appt/pt saw Dr. Bayard Hugger on 3/108for cold/cough/sore throat/question about Lexapro/aj.  History of Present Illness: Patient presents for well woman exam - pelvic with pap. In the interval she has been going through the process of screening and schduling for bariatric surgery. As part of her eval the psychologist she has been seeing feels she may benefit from increased dose of lexapro.  Current Medications (verified): 1)  Hydrochlorothiazide 25 Mg Tabs (Hydrochlorothiazide) .... Take 1 Tablet By Mouth Once A Day 2)  Lexapro 10 Mg Tabs (Escitalopram Oxalate) .... Take 1 Tablet By Mouth Once A Day 3)  Lisinopril 10 Mg Tabs (Lisinopril) .... Take 1 Tablet By Mouth Once A Day 4)  Lovastatin 40 Mg Tabs (Lovastatin) .... Take 1 Tablet By Mouth Once A Day 5)  Phendimetrazine Tartrate 35 Mg Tabs (Phendimetrazine Tartrate) .... 2 in Am, 1 Pm 6)  Metoclopramide Hcl 10 Mg Tabs (Metoclopramide Hcl) .... Take 1 Tablet By Mouth Two Times A Day 7)  Prilosec Otc 20 Mg Tbec (Omeprazole Magnesium) .Marland Kitchen.. 1 By Mouth Qam 8)  Fluticasone Propionate 50 Mcg/act Susp (Fluticasone Propionate) .Marland Kitchen.. 1 Spray Each Nostril  Every Morning  Allergies (verified): 1)  * Contrast Dye PMH-FH-SH reviewed-no changes except otherwise noted  Review of Systems  The patient denies anorexia, fever, weight loss, weight gain, decreased hearing, hoarseness, chest pain, dyspnea on  exertion, peripheral edema, headaches, hemoptysis, abdominal pain, hematochezia, incontinence, muscle weakness, difficulty walking, depression, and angioedema.    Physical Exam  General:  overweight AA female in no distress Genitalia:  Pelvic Exam:        External: normal female genitalia without lesions or masses        Vagina: normal without lesions or masses        Cervix: normal without lesions or masses        Pap smear: performed   Impression & Recommendations:  Problem # 1:  WELL WOMAN (ICD-V70.0) unremarkable pelvic exam. PAP results pending.   Complete Medication List: 1)  Hydrochlorothiazide 25 Mg Tabs (Hydrochlorothiazide) .... Take 1 tablet by mouth once a day 2)  Lexapro 10 Mg Tabs (Escitalopram oxalate) .... Take 1 tablet by mouth once a day 3)  Lisinopril 10 Mg Tabs (Lisinopril) .... Take 1 tablet by mouth once a day 4)  Lovastatin 40 Mg Tabs (Lovastatin) .... Take 1 tablet by mouth once a day 5)  Phendimetrazine Tartrate 35 Mg Tabs (Phendimetrazine tartrate) .... 2 in am, 1 pm 6)  Metoclopramide Hcl 10 Mg Tabs (Metoclopramide hcl) .... Take 1 tablet by mouth two times a day 7)  Prilosec Otc 20 Mg Tbec (Omeprazole magnesium) .Marland Kitchen.. 1 by mouth qam 8)  Fluticasone Propionate 50 Mcg/act Susp (Fluticasone propionate) .Marland Kitchen.. 1 spray each nostril  every morning

## 2011-01-06 NOTE — Letter (Signed)
Summary: [facility]  [facility]   Imported By: Lester Lebanon 01/04/2010 10:01:10  _____________________________________________________________________  External Attachment:    Type:   Image     Comment:   External Document

## 2011-01-06 NOTE — Progress Notes (Signed)
Summary: [facility] Paperwork--done.  Phone Note Outgoing Call   Summary of Call: Paperwork has been completed and copy sent to scanning. Called to notify patient, left message on machine to call back to office. Initial call taken by: Lucious Groves,  December 31, 2009 2:18 PM  Follow-up for Phone Call        [place] notified patient. Follow-up by: Lucious Groves,  January 01, 2010 8:40 AM

## 2011-01-08 NOTE — Letter (Signed)
Summary: [facility]   Imported By: Sherian Rein 01/01/2011 11:19:51  _____________________________________________________________________  External Attachment:    Type:   Image     Comment:   External Document

## 2011-01-14 NOTE — Assessment & Plan Note (Signed)
Summary: PHYSICAL  STC   Vital Signs:  Patient profile:   40 year old female Height:      66 inches Weight:      235 pounds BMI:     38.07 O2 Sat:      98 % on Room air Temp:     98.4 degrees F oral Pulse rate:   59 / minute BP sitting:   104 / 72  (left arm) Cuff size:   large  Vitals Entered By: Katherine Tucker CMA (January 01, 2011 2:32 PM)  O2 Flow:  Room air CC: cpx/ ab, Hypertension Management, Lipid Management, Depression  Vision Screening:      Vision Comments: Eye exam due pt has appt coming up in a couple of weeks   Primary Care Provider:  Norins  CC:  cpx/ ab, Hypertension Management, Lipid Management, and Depression.  History of Present Illness: Katherine Tucker presents for general medical exam and follow-up. She is have her menses and wishes to defer her well woman exam.  IN the interval since her last visit she has had lap band surgery with a total loss of 80 lbs. She is feeling better. She has been back to [facility] for follow-up and has not required any tightening or adjustment of the lap band. She is working hard to maintain her diet and to continue to loose weight.   Depression History:      Positive alarm features for depression include significant weight loss.  However, she denies significant weight gain, insomnia, hypersomnia, psychomotor retardation, fatigue (loss of energy), and impaired concentration (indecisiveness).  The patient denies symptoms of a manic disorder including persistently & abnormally elevated mood, abnormally & persistently irritable mood, less need for sleep, distractibility, flight of ideas, and psychomotor agitation.         Hypertension History:      She denies headache, chest pain, palpitations, dyspnea with exertion, PND, peripheral edema, neurologic problems, and syncope.  She notes no problems with any antihypertensive medication side effects.  Further comments include: will stop medication.        Positive major cardiovascular risk  factors include hyperlipidemia and hypertension.  Negative major cardiovascular risk factors include female age less than 16 years old, no history of diabetes, negative family history for ischemic heart disease, and non-tobacco-user status.        Further assessment for target organ damage reveals no history of ASHD, stroke/TIA, or peripheral vascular disease.    Lipid Management History:      Positive NCEP/ATP III risk factors include hypertension.  Negative NCEP/ATP III risk factors include female age less than 63 years old, no history of early menopause without estrogen hormone replacement, non-diabetic, HDL cholesterol greater than 60, no family history for ischemic heart disease, non-tobacco-user status, no ASHD (atherosclerotic heart disease), no prior stroke/TIA, no peripheral vascular disease, and no history of aortic aneurysm.      Current Medications (verified): 1)  Hydrochlorothiazide 25 Mg Tabs (Hydrochlorothiazide) .... Take 1 Tablet By Mouth Once A Day 2)  Citalopram Hydrobromide 40 Mg Tabs (Citalopram Hydrobromide) .Marland Kitchen.. 1 By Mouth Once Daily  Allergies (verified): 1)  * Contrast Dye  Past History:  Past Medical History: chicken pox Anxiety Depression Headache Hyperlipidemia Hypertension morbid obesity  physician roster: Katherine Tucker Katherine Tucker - surgery  Family History: Reviewed history from 10/06/2007 and no changes required. mother-arthritis Paternal great-aunt with breast cancer. parents with HTN, lipids grandparent with DM  Social History: Reviewed history from 02/22/2009 and no changes  required. 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  The patient denies anorexia, fever, vision loss, decreased hearing, hoarseness, chest pain, syncope, dyspnea on exertion, prolonged  cough, hemoptysis, abdominal pain, severe indigestion/heartburn, incontinence, muscle weakness, transient blindness, depression, unusual weight change, abnormal bleeding, enlarged lymph nodes, and angioedema.    Physical Exam  General:  overweight (but markedly improved) AA woman in no distress Head:  normocephalic and atraumatic.   Eyes:  vision grossly intact, pupils equal, pupils round, corneas and lenses clear, and no injection.   Ears:  External ear exam shows no significant lesions or deformities.  Otoscopic examination reveals clear canals, tympanic membranes are intact bilaterally without bulging, retraction, inflammation or discharge. Hearing is grossly normal bilaterally. Nose:  no external deformity and no external erythema.   Mouth:  Oral mucosa and oropharynx without lesions or exudates.  Teeth in good repair. Neck:  supple, full ROM, no thyromegaly, and no carotid bruits.   Lungs:  Normal respiratory effort, chest expands symmetrically. Lungs are clear to auscultation, no crackles or wheezes. Heart:  Normal rate and regular rhythm. S1 and S2 normal without gallop, murmur, click, rub or other extra sounds. Abdomen:  Palpable port. soft, no guarding, no rigidity, and no rebound tenderness.   Msk:  normal ROM, no joint tenderness, no joint swelling, and no joint deformities.   Pulses:  R and L carotid,radial,femoral,dorsalis pedis and posterior tibial pulses are full and equal bilaterally Extremities:  trace left pedal edema.   Neurologic:  alert & oriented X3, cranial nerves II-XII intact, sensation intact to pinprick, gait normal, and DTRs symmetrical and normal.   Skin:  turgor normal, color normal, and no rashes.   Cervical Nodes:  no anterior cervical adenopathy and no posterior cervical adenopathy.   Psych:  Oriented X3, memory intact for recent and remote, normally interactive, good eye contact, and not anxious appearing.     Impression & Recommendations:  Problem # 1:   MORBID OBESITY (ICD-278.01) Katherine Tucker has done wll with lap-band procedure and has lost considerable weight. Her chronic metabolic problems are much better!! She has lost 80+ pounds.   Plan - continued weight loss through good life-style: careful diet, especiall portions size, regular exercise.   Problem # 2:  HYPERLIPIDEMIA (ICD-272.4) Labs Reviewed: SGOT: 21 (12/29/2010)   SGPT: 15 (12/29/2010)   HDL:69.40 (12/29/2010), 60.50 (08/18/2010)  LDL:114 (12/29/2010), 95 (96/78/9381)  Chol:189 (12/29/2010), 166 (08/18/2010)  Trig:26.0 (12/29/2010), 51.0 (08/18/2010)  Excellent lipid panel OFF medications!! Atributable to lap band surgery and weight loss.  Problem # 3:  HYPERTENSION (ICD-401.9)  Her updated medication list for this problem includes:    Hydrochlorothiazide 25 Mg Tabs (Hydrochlorothiazide) .Marland Kitchen... Take 1 tablet by mouth once a day  BP today: 104/72 Prior BP: 112/64 (07/07/2010)  Labs Reviewed: K+: 3.8 (12/29/2010) Creat: : 0.9 (12/29/2010)    BP is fabulous.  Plan - stop medication and monitor BP  Problem # 4:  Preventive Health Care (ICD-V70.0) Patient is doing GREAT. She is encouraged to continue to loos weight at a rate of 1-2 lbs per month through diet and exercise..She is due for breast exam.  She will return for a well woman exam at her convenience.  Complete Medication List: 1)  Hydrochlorothiazide 25 Mg Tabs (Hydrochlorothiazide) .... Take 1 tablet by mouth once a day 2)  Citalopram Hydrobromide 40 Mg Tabs (Citalopram hydrobromide) .Marland Kitchen.. 1 by mouth once daily  Hypertension Assessment/Plan:      The patient's hypertensive risk group is category B: At least one risk factor (excluding diabetes) with no target organ damage.  Her calculated 10 year risk of coronary heart disease is 1 %.  Today's blood pressure is 104/72.    Lipid Assessment/Plan:      Based on NCEP/ATP III, the patient's risk factor category is "0-1 risk factors".  The patient's lipid goals are as  follows: Total cholesterol goal is 200; LDL cholesterol goal is 160; HDL cholesterol goal is 40; Triglyceride goal is 150.     Patient: Katherine Tucker Note: All result statuses are Final unless otherwise noted.  Tests: (1) Lipid Panel (LIPID)   Cholesterol               189 mg/dL                   1-610     ATP III Classification            Desirable:  < 200 mg/dL                    Borderline High:  200 - 239 mg/dL               High:  > = 240 mg/dL   Triglycerides             26.0 mg/dL                  9.6-045.4     Normal:  <150 mg/dL     Borderline High:  098 - 199 mg/dL   HDL                       11.91 mg/dL                 >47.82   VLDL Cholesterol          5.2 mg/dL                   9.5-62.1   LDL Cholesterol      [H]  308 mg/dL                   6-57  CHO/HDL Ratio:  CHD Risk                             3                    Men          Women     1/2 Average Risk     3.4          3.3     Average Risk          5.0          4.4     2X Average Risk          9.6          7.1     3X Average Risk          15.0          11.0                           Tests: (2) BMP (  METABOL)   Sodium                    136 mEq/L                   135-145   Potassium                 3.8 mEq/L                   3.5-5.1   Chloride                  101 mEq/L                   96-112   Carbon Dioxide            30 mEq/L                    19-32   Glucose                   84 mg/dL                    16-10   BUN                       16 mg/dL                    9-60   Creatinine                0.9 mg/dL                   4.5-4.0   Calcium                   10.3 mg/dL                  9.8-11.9   GFR                       92.86 mL/min                >60.00  Tests: (3) CBC Platelet w/Diff (CBCD)   White Cell Count     [L]  3.5 K/uL                    4.5-10.5   Red Cell Count            3.91 Mil/uL                 3.87-5.11   Hemoglobin           [L]  11.9 g/dL                   14.7-82.9    Hematocrit           [L]  35.2 %                      36.0-46.0   MCV                       89.9 fl                     78.0-100.0   MCHC                      34.0 g/dL  30.0-36.0   RDW                  [H]  14.9 %                      11.5-14.6   Platelet Count            171.0 K/uL                  150.0-400.0   Neutrophil %              46.7 %                      43.0-77.0   Lymphocyte %              42.7 %                      12.0-46.0   Monocyte %                8.1 %                       3.0-12.0   Eosinophils%              2.2 %                       0.0-5.0   Basophils %               0.3 %                       0.0-3.0   Neutrophill Absolute      1.6 K/uL                    1.4-7.7   Lymphocyte Absolute       1.5 K/uL                    0.7-4.0   Monocyte Absolute         0.3 K/uL                    0.1-1.0  Eosinophils, Absolute                             0.1 K/uL                    0.0-0.7   Basophils Absolute        0.0 K/uL                    0.0-0.1  Tests: (4) Hepatic/Liver Function Panel (HEPATIC)   Total Bilirubin           0.6 mg/dL                   6.2-1.3   Direct Bilirubin          0.1 mg/dL                   0.8-6.5   Alkaline Phosphatase [L]  36 U/L                      39-117   AST  21 U/L                      0-37   ALT                       15 U/L                      0-35   Total Protein             6.3 g/dL                    1.6-1.0   Albumin                   3.7 g/dL                    9.6-0.4  Tests: (5) TSH (TSH)   FastTSH                   1.52 uIU/mL                 0.35-5.50  Tests: (6) UDip Only (UDIP)   Color                     YELLOW       RANGE:  Yellow;Lt. Yellow   Clarity                   CLEAR                       Clear   Specific Gravity          >=1.030                     1.000 - 1.030   Urine Ph                  5.5                         5.0-8.0   Protein                   NEGATIVE                     Negative   Urine Glucose             NEGATIVE                    Negative   Ketones                   NEGATIVE                    Negative   Urine Bilirubin           NEGATIVE                    Negative   Blood                     TRACE-INTACT                Negative   Urobilinogen              0.2  0.0 - 1.0   Leukocyte Esterace        NEGATIVE                    Negative   Nitrite                   NEGATIVE                    Negative  Orders Added: 1)  Est. Patient Level IV [[id number]]

## 2011-01-15 ENCOUNTER — Other Ambulatory Visit: Payer: Self-pay | Admitting: Internal Medicine

## 2011-01-15 ENCOUNTER — Other Ambulatory Visit (HOSPITAL_COMMUNITY)
Admission: RE | Admit: 2011-01-15 | Discharge: 2011-01-15 | Disposition: A | Discharge status: Home / Self Care | Payer: BC Managed Care – PPO | Source: Ambulatory Visit | Attending: Internal Medicine | Admitting: Internal Medicine

## 2011-01-15 ENCOUNTER — Encounter: Payer: Self-pay | Admitting: Internal Medicine

## 2011-01-15 ENCOUNTER — Ambulatory Visit (INDEPENDENT_AMBULATORY_CARE_PROVIDER_SITE_OTHER): Payer: BC Managed Care – PPO | Admitting: Internal Medicine

## 2011-01-15 DIAGNOSIS — Z01419 Encounter for gynecological examination (general) (routine) without abnormal findings: Secondary | ICD-10-CM | POA: Insufficient documentation

## 2011-01-15 DIAGNOSIS — Z0000 Encounter for general adult medical examination without abnormal findings: Secondary | ICD-10-CM

## 2011-01-15 LAB — HM PAP SMEAR

## 2011-01-21 ENCOUNTER — Encounter: Payer: Self-pay | Admitting: Internal Medicine

## 2011-01-22 NOTE — Assessment & Plan Note (Signed)
Summary: OFFICE VISIT/NWS  #   Vital Signs:  Patient profile:   40 year old female Weight:      236 pounds BMI:     38.23 O2 Sat:      98 % Temp:     98.4 degrees F Pulse rate:   62 / minute BP sitting:   118 / 72  (left arm) Cuff size:   regular  Vitals Entered By: Lamar Sprinkles, CMA (January 15, 2011 3:37 PM) CC: Well woman exam/SD   Primary Care Provider:  Vikram Tillett  CC:  Well woman exam/SD.  History of Present Illness: Patient presents for a well woman exam. See previous note. In the interval she has tried pan popped popcorn and really likes it.   Current Medications (verified): 1)  Citalopram Hydrobromide 40 Mg Tabs (Citalopram Hydrobromide) .Marland Kitchen.. 1 By Mouth Once Daily 2)  Calcium 1500 Mg Tabs (Calcium Carbonate) .... Daily 3)  Multivitamins  Tabs (Multiple Vitamin) .... Daily 4)  Cod Liver Oil  Oil (Cod Liver Oil) .... Daily  Allergies: 1)  * Contrast Dye PMH-FH-SH reviewed-no changes except otherwise noted  Review of Systems       The patient complains of weight loss.  The patient denies anorexia, fever, decreased hearing, chest pain, dyspnea on exertion, abdominal pain, severe indigestion/heartburn, genital sores, muscle weakness, depression, and enlarged lymph nodes.    Physical Exam  General:  Heavyset, overweight AA woman in no distress Head:  normocephalic and atraumatic.   Breasts:  skin/areolae normal, no masses, no abnormal thickening, no nipple discharge, and no tenderness.   Genitalia:  Pelvic Exam:        External: normal female genitalia without lesions or masses        Vagina: normal without lesions or masses        Cervix: normal without lesions or masses        Pap smear: performed   Impression & Recommendations:  Problem # 1:  Screening Breast Cancer (ICD-V76.10) normal breast exam  Problem # 2:  Screening Cervical Cancer (ICD-V76.2) Normal pelvic exam.   Problem # 3:  HYPERTENSION (ICD-401.9)  The following medications were removed  from the medication list:    Hydrochlorothiazide 25 Mg Tabs (Hydrochlorothiazide) .Marland Kitchen... Take 1 tablet by mouth once a day  BP today: 118/72 Prior BP: 104/72 (01/01/2011)  Prior 10 Yr Risk Heart Disease: 1 % (01/01/2011)  Labs Reviewed: K+: 3.8 (12/29/2010) Creat: : 0.9 (12/29/2010)   Chol: 189 (12/29/2010)   HDL: 69.40 (12/29/2010)   LDL: 114 (12/29/2010)   TG: 26.0 (12/29/2010)  Normotensive off medication!!  Complete Medication List: 1)  Citalopram Hydrobromide 40 Mg Tabs (Citalopram hydrobromide) .Marland Kitchen.. 1 by mouth once daily 2)  Calcium 1500 Mg Tabs (Calcium carbonate) .... Daily 3)  Multivitamins Tabs (Multiple vitamin) .... Daily 4)  Cod Liver Oil Oil (Cod liver oil) .... Daily   Orders Added: 1)  Est. Patient Level III [60454]

## 2011-01-28 NOTE — Letter (Signed)
   [facility] [address] [place], [place]  [address] Phone: [phone]      January 21, 2011   [facility] [address] [place], [place] [address]  RE:  LAB RESULTS  Dear  Ms. LORING,  The following is an interpretation of your most recent lab tests.  Please take note of any instructions provided or changes to medications that have resulted from your lab work.  Pap Smear: normal      Sincerely Yours,    Jacques Navy MD

## 2011-02-24 ENCOUNTER — Encounter: Payer: BC Managed Care – PPO | Attending: General Surgery | Admitting: *Deleted

## 2011-02-24 ENCOUNTER — Encounter: Admit: 2011-02-24 | Payer: Self-pay | Admitting: General Surgery

## 2011-02-24 DIAGNOSIS — Z713 Dietary counseling and surveillance: Secondary | ICD-10-CM | POA: Insufficient documentation

## 2011-02-24 DIAGNOSIS — Z09 Encounter for follow-up examination after completed treatment for conditions other than malignant neoplasm: Secondary | ICD-10-CM | POA: Insufficient documentation

## 2011-02-24 DIAGNOSIS — Z9884 Bariatric surgery status: Secondary | ICD-10-CM | POA: Insufficient documentation

## 2011-02-24 LAB — COMPREHENSIVE METABOLIC PANEL
Albumin: 3.7 g/dL (ref 3.5–5.2)
BUN: 19 mg/dL (ref 6–23)
Creatinine, Ser: 0.83 mg/dL (ref 0.4–1.2)
GFR calc Af Amer: >60 mL/min (ref >60)
Potassium: 3.8 mEq/L (ref 3.5–5.1)
Total Protein: 7.3 g/dL (ref 6.0–8.3)

## 2011-02-24 LAB — CBC
HCT: 31.4 % — ABNORMAL LOW (ref 36.0–46.0)
HCT: 34.6 % — ABNORMAL LOW (ref 36.0–46.0)
Hemoglobin: 10.4 g/dL — ABNORMAL LOW (ref 12.0–15.0)
MCHC: 33 g/dL (ref 30.0–36.0)
MCV: 87.9 fL (ref 78.0–100.0)
MCV: 89 fL (ref 78.0–100.0)
Platelets: 217 10*3/uL (ref 150–400)
RDW: 14.3 % (ref 11.5–15.5)
RDW: 14.5 % (ref 11.5–15.5)

## 2011-02-24 LAB — DIFFERENTIAL
Basophils Absolute: 0 10*3/uL (ref 0.0–0.1)
Basophils Relative: 0 % (ref 0–1)
Eosinophils Absolute: 0 10*3/uL (ref 0.0–0.7)
Eosinophils Relative: 0 % (ref 0–5)
Lymphocytes Relative: 24 % (ref 12–46)
Lymphocytes Relative: 38 % (ref 12–46)
Monocytes Absolute: 0.4 10*3/uL (ref 0.1–1.0)
Monocytes Absolute: 0.6 10*3/uL (ref 0.1–1.0)
Monocytes Relative: 8 % (ref 3–12)
Neutro Abs: 2.7 10*3/uL (ref 1.7–7.7)

## 2011-02-24 LAB — PREGNANCY, URINE: Preg Test, Ur: NEGATIVE

## 2011-03-23 LAB — DIFFERENTIAL
Basophils Relative: 0 % (ref 0–1)
Eosinophils Absolute: 0.1 10*3/uL (ref 0.0–0.7)
Lymphs Abs: 1.8 10*3/uL (ref 0.7–4.0)
Neutrophils Relative %: 48 % (ref 43–77)

## 2011-03-23 LAB — CBC
MCV: 88.2 fL (ref 78.0–100.0)
Platelets: 275 10*3/uL (ref 150–400)
WBC: 4.5 10*3/uL (ref 4.0–10.5)

## 2011-03-23 LAB — BASIC METABOLIC PANEL
BUN: 18 mg/dL (ref 6–23)
Chloride: 97 mEq/L (ref 96–112)
Creatinine, Ser: 1 mg/dL (ref 0.4–1.2)

## 2011-04-21 NOTE — Op Note (Signed)
Katherine Tucker, BAL              ACCOUNT NO.:  [id number]   MEDICAL RECORD NO.:  [id number]          PATIENT TYPE:  AMB   LOCATION:  DSC                          FACILITY:  [facility]   PHYSICIAN:  Thomas A. Cornett, M.D.DATE OF BIRTH:  18-Aug-1971   DATE OF PROCEDURE:  12/28/2008  DATE OF DISCHARGE:                               OPERATIVE REPORT   PREOPERATIVE DIAGNOSIS:  Left breast nipple discharge.   POSTOPERATIVE DIAGNOSIS:  Left breast nipple discharge.   PROCEDURE:  Left breast central duct excision.   SURGEON:  Maisie Fus A. Cornett, MD   ANESTHESIA:  LMA 0.25% Sensorcaine local.   ESTIMATED BLOOD LOSS:  10 mL.   SPECIMEN:  Left breast tissue with dilated lactiferous ducts to  pathology.   INDICATIONS FOR PROCEDURE:  The patient is a pleasant 40 year old female  who has had a left breast nipple discharge.  Ultrasound was done, which  showed a small papilloma located in the left breast at about 9 o'clock,  3 cm from the nipple.  She presents today for excision of this due to  drainage and to exclude malignancy.   DESCRIPTION OF PROCEDURE:  The patient was brought to the operating room  and placed supine.  After induction of LMA anesthesia, the left breast  was prepped and draped in sterile fashion.  I was able to express clear  fluid from the duct of interest, but I could not get a probe to go down  it.  An incision was made along the medial portion of her left nipple at  about 9 o'clock.  The incision was then used to get up underneath the  nipple and excise the central duct system entering the nipple.  The  lateral portion of breast tissue was left to the skin and this was not  excised.  There were significant dilated central ducts noted and the  area of interest was excised in its entirety and sent to pathology for  evaluation.  The wound was irrigated.  It was closed in layers with a  deep layer of 3-0 Vicryl and subsequent 4-0 Monocryl stitch.  Dermabond  was applied.   All final counts of sponge, needle, and instruments were  found to be correct at this portion of the case.  The patient was then  awoke and taken to recovery in satisfactory condition.      Thomas A. Cornett, M.D.  Electronically Signed     TAC/MEDQ  D:  12/28/2008  T:  12/29/2008  Job:  [id number]   cc:   Rosalyn Gess. Norins, MD

## 2011-04-21 NOTE — Assessment & Plan Note (Signed)
[facility]                         GASTROENTEROLOGY OFFICE NOTE   Katherine Tucker, Katherine Tucker                     MRN:          [id number]  DATE:04/22/2007                            DOB:          [birth year]    HISTORY:  Katherine Tucker presents today at the request of our office.  She is a  40 year old with gastroesophageal reflux disease complicated by morbid  obesity.  She was last evaluated 08/30/2006.  At that time she was  advised with regards to reflux precautions and weight loss.  She was  continued on Nexium.  We initiated Reglan 10 mg b.i.d.  The patient was  advised with regards to possible side effects, including but not limited  to dystonic reactions.  Also that she could use antacids p.r.n.  Finally, she was asked to follow up in 6-8 weeks and agreed.  She never  followed up.  Subsequently, a letter was sent from [employer] informing us  that there may be a possible interaction between her metoclopramide and  anxiety disorder (it was noted that she was also on Lexapro).  This  notice came one month ago.  Not remembering the patient, her chart was  pulled and reviewed.  I noted that the patient was advised with regards  to possible side effects.  I also noted that she did not follow up as  requested.  Finally, I stated that if she did request further refills  from the office that she would need Katherine Tucker office visit first.  She presents  today.  The patient is pleased to tell me that she has been doing  better.  She feels the Reglan has been quite helpful.  She generally  takes it once a day, occasionally twice.  Off the medication her  symptoms are worse.  She continues with Nexium.  There is no evidence of  any side effects from her medications.  Current medications include  Nexium, Lexapro, hydrochlorothiazide, lisinopril, cod oil, birth control  pill, multivitamin, metoclopramide, and Lovastatin.   PHYSICAL EXAMINATION:  A well-appearing female in no acute  distress.  Blood pressure is 110/76, heart rate 68,weight is 298.6 pounds (no  significant change).  LUNGS:  Clear.  HEART:  Regular.  ABDOMEN:  Soft, without tenderness, mass or hernia.   IMPRESSION:  Gastroesophageal reflux disease complicated by morbid  obesity.  Symptoms improved on a combination of Nexium and once or twice  daily metoclopramide.   RECOMMENDATIONS:  1. Continue adherence to reflux precautions with hopes of weight loss.  2. Continue Nexium.  3. Continue Reglan for now.  Again, possible side effects reviewed.  4. Assuming the patient is doing well, follow up in about 1 year.      Ongoing general medical care with Dr. Illene Regulus.    Wilhemina Bonito. Marina Goodell, MD    JNP/MedQ  DD: 04/22/2007  DT: 04/22/2007  Job #: [id number]   cc:   Rosalyn Gess. Norins, MD

## 2011-04-24 NOTE — Discharge Summary (Signed)
Katherine Tucker, Katherine Tucker              ACCOUNT NO.:  [id number]   MEDICAL RECORD NO.:  [id number]          PATIENT TYPE:  INP   LOCATION:  5158                         FACILITY:  [facility]   PHYSICIAN:  Sandford Craze, NP DATE OF BIRTH:  1971-08-05   DATE OF ADMISSION:  04/12/2007  DATE OF DISCHARGE:  04/13/2007                               DISCHARGE SUMMARY   DISCHARGE DIAGNOSIS:  1. Nausea, vomiting, diarrhea with fever and myalgias.  2. Headache status post lumbar puncture with unremarkable      cerebrospinal fluid findings at time of discharge.  3. Hypokalemia/hyponatremia.  4. Hypertension.  5. Morbid obesity.  6. Gastroesophageal reflux disease.  7. Back pain at lumbar puncture site.   HISTORY OF PRESENT ILLNESS:  Katherine Tucker is a 40 year old female who  was admitted on 04/12/2007 with chief complaint of nausea, vomiting and  diarrhea.  She had loose stools with mucus in the stools.  She has had  no out of town travel.  She was noted to be tachycardiac, in sinus  tachycardia, at time of admission and complained of pounding headache  and diffuse myalgias.  She was admitted for further evaluation and  treatment.   PAST MEDICAL HISTORY:  1. GERD.  2. Hypertension.  3. Hyperlipidemia.  4. Sleep apnea.  5. Depression.  6. Morbid obesity.   COURSE OF HOSPITALIZATION:  Problem No. 1.  Nausea, vomiting, diarrhea.  The patient was admitted  and was treated with antiemetics.  In addition, she underwent a lumbar  puncture to rule out meningitis.  CSF findings were unremarkable.  Final  culture results were negative.  The patient was noted to be mildly  hypokalemic and hyponatremic.  She was given IV hydration and oral  potassium repletion.  She was noted to have mild hypotension; however,  upon repeat her blood pressure was improved.   PERTINENT LABORATORIES AT THE TIME OF DISCHARGE:  Sodium 131, potassium  3.2.  Hemoglobin 12.3, hematocrit 36.4.   DISPOSITION:  The patient  was discharged to home.   MEDICATIONS AT THE TIME OF DISCHARGE:  1. Nexium 40 mg p.o. b.i.d.  2. Lexapro 10 mg p.o. daily.  3. HCTZ 25 mg p.o. daily.  4. Lisinopril 10 mg p.o. daily.  5. Multivitamin 1 tab p.o. daily.  6. Reglan 10 mg p.o. b.i.d.  7. Lovastatin to be continued as before.  8. Allegra 180 mg p.o. daily as needed.  9. Tylenol as needed for pain.  10.Zyrtec D or Claritin D as needed.   DISPOSITION:  The patient is discharged to home.   FOLLOWUP:  The patient was instructed to follow up with Dr. Illene Regulus in 1 week and contact the office for an appointment.      Sandford Craze, NP     MO/MEDQ  D:  07/01/2007  T:  07/02/2007  Job:  [id number]   cc:   Rosalyn Gess. Norins, MD

## 2011-04-24 NOTE — Assessment & Plan Note (Signed)
[facility]                           GASTROENTEROLOGY OFFICE NOTE   Katherine Tucker, Katherine Tucker                       MRN:          [id number]  DATE:08/30/2006                            DOB:          1971/05/03    HISTORY:  Katherine Tucker presents today for followup regarding management of reflux  disease.  She has history of hypertension and hyperlipidemia, sleep apnea,  depression and morbid obesity.  She was evaluated initially June 17, 2006.  See that dictation for details.  She was changed from Prilosec 20 mg daily  to Nexium 40 mg daily.  Upper endoscopy was performed July 07, 2006.  She  was found to have mild esophagitis and small hiatal hernia.  No other  abnormalities.  She has continued with exercise and Weight Watchers.  She  states she has lost five pounds in the past three weeks.  She has had  significant problems with belching.  She contacted the office with  complaints of nausea and her Nexium was increased to b.i.d.  This helped  minimally.  She continues with some intermittent epigastric burning.  Some  regurgitation at night.  Some regurgitation while exercising.  The patient  has regurgitated undigested food on a couple of occasions.   CURRENT MEDICATIONS:  1. Nexium 40 mg b.i.d.  2. Lexapro 10 mg daily.  3. Hydrochlorothiazide 25 mg daily.  4. Lisinopril 10 mg daily.  5. Cod liver oil.  6. Birth control pill.  7. Multivitamin.   PHYSICAL EXAMINATION:  GENERAL:  Well-appearing female in no acute distress.  VITAL SIGNS:  Blood pressure 108/60, heart rate 78, weight 299.4 pounds.  ABDOMEN:  Obese and soft without tenderness or mass.  No succussion splash  appreciated.   IMPRESSION:  Gastroesophageal reflux disease, complicated by morbid obesity.   RECOMMENDATIONS:  1. Continued adherence to reflux precautions with attention to weight      loss.  Precaution sheet provided.  2. Reflux diet.  Diet provided.  3. Continue Nexium at 40 mg  daily.  4. Initiate Reglan 10 mg p.o. b.i.d. the patient was advised with regard      to possible side effects including dystonic reactions.  5. Antacids p.r.n.  6. Office followup in six to eight weeks.                                   Wilhemina Bonito. Eda Keys., MD   JNP/MedQ  DD:  08/30/2006  DT:  09/01/2006  Job #:  [id number]   cc:   Rosalyn Gess. Norins, MD

## 2011-04-24 NOTE — Assessment & Plan Note (Signed)
[facility]                           GASTROENTEROLOGY OFFICE NOTE   Katherine Tucker, Katherine Tucker                       MRN:          [id number]  DATE:06/17/2006                            DOB:          [birth year]    REASON FOR CONSULTATION:  Chronic reflux disease.   HISTORY:  This is a 40 year old African-American female with a history of  morbid obesity, hypertension, hyperlipidemia and sleep apnea.  She is  referred through the courtesy of Dr. Debby Bud regarding chronic reflux  disease.  The patient has had reflux symptoms at least 10 years.  She  initially treated herself with TUMS on a regular basis.  Subsequently she  has been on Prilosec 20 mg daily over the past 3 years.  In general she has  no symptoms on medication, though significant symptoms off of medication.  However recently she has noticed recurrent symptoms manifested by nausea,  bloating and indigestion despite taking the medication.  She's had an  estimated 70 lb weight gain over the past 7 years.  She denies dysphagia or  vomiting.  No abdominal pain.  Bowel habits are regular without bleeding.  She has not had prior GI evaluation.   PAST MEDICAL HISTORY:  1.  Hypertension.  2.  Hyperlipidemia.  3.  Sleep Apnea.  4.  Morbid obesity.  5.  History of depression.   PAST SURGICAL HISTORY:  None.   ALLERGIES:  No known drug allergies.   CURRENT MEDICATIONS:  1.  Hydrochlorothiazide 25 mg daily.  2.  Birth control pills.  3.  Multi-vitamin.  4.  Cod-Liver Oil.  5.  Lisinopril 10 mg daily.  6.  Prilosec OTC 20 mg daily.  7.  Lovastatin 40 mg daily.  8.  Lexapro 10 mg daily.   FAMILY HISTORY:  No family history of gastrointestinal malignancy.  It  sounds like her father has had significant problems with reflux disease.  Both parents with diabetes.   SOCIAL HISTORY:  Patient is single without children.  She lives alone.  She  had an undergraduate at [employer] and she has a Regulatory affairs officer from [employer].  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  She does not smoke.  Occasionally uses alcohol.   REVIEW OF SYSTEMS:  Per diagnostic evaluation form.   PHYSICAL EXAMINATION:  Well appearing female in no acute distress.  VITAL SIGNS:  Blood pressure is 124/84.  Heart rate is 78.  She is 5' 6 in  height.  She refused to be weighed though her weight May 31, 2006 was 281  pounds.  HEENT:  Sclerae anicteric. Conjunctivae pink.  Oral mucosa intact.  No  adenopathy.  LUNGS:  Clear.  HEART:  Regular.  ABDOMEN:  Obese, soft without tenderness, masses or hernia.  The bowel  sounds heard.  EXTREMITIES:  Without edema.   IMPRESSION:  40 year old female with multiple medical problems who presents  with chronic reflux disease.  Currently experiencing breakthrough symptoms  on proton pump inhibitor therapy.  Her principal problem as she knows is  obesity and progressive weight  gain.   RECOMMENDATIONS:  1.  Change from Prilosec to Nexium 40 mg daily.  2.  Schedule upper endoscopy to rule out complications from chronic reflux      disease.  3.  Reflux precautions with attention to weight gain.  We discussed a formal      nutritional consult as well as structured exercise program with a      professional trainer.  4.  Ongoing general medical care with Dr. Debby Bud.                                   Wilhemina Bonito. Eda Keys., MD   JNP/MedQ  DD:  06/17/2006  DT:  06/17/2006  Job #:  [id number]   cc:   Rosalyn Gess. Norins, MD

## 2011-05-13 ENCOUNTER — Encounter: Payer: BC Managed Care – PPO | Attending: General Surgery | Admitting: *Deleted

## 2011-05-13 DIAGNOSIS — Z713 Dietary counseling and surveillance: Secondary | ICD-10-CM | POA: Insufficient documentation

## 2011-05-13 DIAGNOSIS — Z09 Encounter for follow-up examination after completed treatment for conditions other than malignant neoplasm: Secondary | ICD-10-CM | POA: Insufficient documentation

## 2011-05-13 DIAGNOSIS — Z9884 Bariatric surgery status: Secondary | ICD-10-CM | POA: Insufficient documentation

## 2011-05-21 ENCOUNTER — Encounter: Payer: Self-pay | Admitting: Internal Medicine

## 2011-05-22 ENCOUNTER — Encounter: Payer: Self-pay | Admitting: Internal Medicine

## 2011-05-22 ENCOUNTER — Ambulatory Visit (INDEPENDENT_AMBULATORY_CARE_PROVIDER_SITE_OTHER): Payer: BC Managed Care – PPO | Admitting: Internal Medicine

## 2011-05-22 VITALS — BP 118/78 | HR 67 | Temp 99.2°F | Resp 14

## 2011-05-22 DIAGNOSIS — N926 Irregular menstruation, unspecified: Secondary | ICD-10-CM

## 2011-05-22 MED ORDER — LEVONORG-ETH ESTRAD TRIPHASIC PO TABS: 1.0000 | ORAL_TABLET | Freq: Every day | ORAL | Status: DC

## 2011-05-24 NOTE — Progress Notes (Signed)
  Subjective:    Patient ID: Katherine Tucker, female    DOB: [birth year], 40 y.o.   MRN: [id number]  HPI [name]  presents to request OCP to help regulate her menstrual cycle. Since her lapband surgery  And 100 lb weight loss she has had a very irrgular menses along with metorrhagia. She would like to see if resuming OCPs helps. She was previously on OCPs and tolerated them well.  She continues to do well with weight management after her surgery. She is having no adverse effects or symptoms. She is current with her general surgeon for follow-up. She has not required any adjustment of the lab-band.  PMH, FamHx and SocHx reviewed for any changes and relevance.    Review of Systems No constitutional symptoms No repsiratory problems  No cardiac symptoms or problems GI- good appetite, normal bowel habity No NSK complainhts or problems.    Objective:   Physical Exam Vitals noted but no weight recorded Gen'l - WNWD AA woman who haas definitely lost weight. She looks robust and vibrant HEENT = C&S clear Pul - normal respirations Cor - RRR       Assessment & Plan:  1. Irregular menses   Plan - resume OCPs - using same formulation as she has used previously

## 2011-07-28 ENCOUNTER — Ambulatory Visit
Admission: RE | Admit: 2011-07-28 | Discharge: 2011-07-28 | Disposition: A | Discharge status: Home / Self Care | Payer: BC Managed Care – PPO | Source: Ambulatory Visit | Attending: General Surgery | Admitting: General Surgery

## 2011-07-28 ENCOUNTER — Encounter (INDEPENDENT_AMBULATORY_CARE_PROVIDER_SITE_OTHER): Payer: Self-pay | Admitting: General Surgery

## 2011-07-28 ENCOUNTER — Ambulatory Visit (INDEPENDENT_AMBULATORY_CARE_PROVIDER_SITE_OTHER): Payer: BC Managed Care – PPO | Admitting: General Surgery

## 2011-07-28 ENCOUNTER — Other Ambulatory Visit (INDEPENDENT_AMBULATORY_CARE_PROVIDER_SITE_OTHER): Payer: Self-pay

## 2011-07-28 VITALS — BP 138/86 | HR 60 | Temp 97.5°F

## 2011-07-28 DIAGNOSIS — R1110 Vomiting, unspecified: Secondary | ICD-10-CM

## 2011-07-28 DIAGNOSIS — R112 Nausea with vomiting, unspecified: Secondary | ICD-10-CM

## 2011-07-28 DIAGNOSIS — Z9884 Bariatric surgery status: Secondary | ICD-10-CM

## 2011-07-28 DIAGNOSIS — Z4651 Encounter for fitting and adjustment of gastric lap band: Secondary | ICD-10-CM
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
## 2011-07-31 ENCOUNTER — Ambulatory Visit (INDEPENDENT_AMBULATORY_CARE_PROVIDER_SITE_OTHER): Payer: BC Managed Care – PPO | Admitting: General Surgery

## 2011-07-31 ENCOUNTER — Encounter (INDEPENDENT_AMBULATORY_CARE_PROVIDER_SITE_OTHER): Payer: Self-pay | Admitting: General Surgery

## 2011-07-31 VITALS — BP 132/94 | Ht 66.0 in | Wt 202.8 lb

## 2011-07-31 DIAGNOSIS — E669 Obesity, unspecified: Secondary | ICD-10-CM
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
## 2011-07-31 NOTE — Patient Instructions (Signed)
Call as needed for any concerns °

## 2011-08-08 ENCOUNTER — Other Ambulatory Visit: Payer: Self-pay | Admitting: Internal Medicine

## 2011-08-13 ENCOUNTER — Encounter: Payer: Self-pay | Admitting: *Deleted

## 2011-08-13 ENCOUNTER — Encounter: Payer: BC Managed Care – PPO | Attending: General Surgery | Admitting: *Deleted

## 2011-08-13 DIAGNOSIS — Z713 Dietary counseling and surveillance: Secondary | ICD-10-CM | POA: Insufficient documentation

## 2011-08-13 DIAGNOSIS — Z9884 Bariatric surgery status: Secondary | ICD-10-CM | POA: Insufficient documentation

## 2011-08-13 DIAGNOSIS — Z09 Encounter for follow-up examination after completed treatment for conditions other than malignant neoplasm: Secondary | ICD-10-CM | POA: Insufficient documentation
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
## 2011-08-13 NOTE — Patient Instructions (Signed)
  Goals:  Follow Phase 3B: High Protein + Non-Starchy Vegetables  Eat 3-6 small meals/snacks, every 3-5 hrs  Increase lean protein foods to meet 80g goal  Increase fluid intake to 64oz +  Add 15 grams of carbohydrate (fruit, whole grain, starchy vegetable) with meals  Avoid drinking 15 minutes before, during and 30 minutes after eating  Aim for >30 min of physical activity daily 

## 2011-08-14 ENCOUNTER — Encounter (INDEPENDENT_AMBULATORY_CARE_PROVIDER_SITE_OTHER): Payer: Self-pay | Admitting: General Surgery

## 2011-08-14 ENCOUNTER — Ambulatory Visit (INDEPENDENT_AMBULATORY_CARE_PROVIDER_SITE_OTHER): Payer: BC Managed Care – PPO | Admitting: General Surgery

## 2011-08-14 NOTE — Patient Instructions (Signed)
Call as needed for any questions or concerns 
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
## 2011-09-16 ENCOUNTER — Encounter: Payer: BC Managed Care – PPO | Admitting: *Deleted

## 2011-10-02 ENCOUNTER — Encounter (INDEPENDENT_AMBULATORY_CARE_PROVIDER_SITE_OTHER): Payer: Self-pay

## 2011-10-02 ENCOUNTER — Ambulatory Visit (INDEPENDENT_AMBULATORY_CARE_PROVIDER_SITE_OTHER): Payer: BC Managed Care – PPO | Admitting: Physician Assistant

## 2011-10-02 VITALS — BP 118/84 | HR 70 | Temp 97.6°F | Resp 14 | Ht 66.0 in | Wt 206.4 lb

## 2011-10-02 DIAGNOSIS — Z4651 Encounter for fitting and adjustment of gastric lap band: Secondary | ICD-10-CM

## 2011-10-02 DIAGNOSIS — E669 Obesity, unspecified: Secondary | ICD-10-CM

## 2011-10-02 NOTE — Progress Notes (Signed)
  HISTORY: KLARISSA MCILVAIN is a 40 y.o.female who received an AP-Standard lap-band in May 2011 by Dr. Johna Sheriff. She was last seen one month ago for an adjustment but is having persistent hunger and large portion sizes. She has no regurgitation symptoms.  VITAL SIGNS: Filed Vitals:   10/02/11 1606  BP: 118/84  Pulse: 70  Temp: 97.6 F (36.4 C)  Resp: 14    PHYSICAL EXAM: Physical exam reveals a very well-appearing 40 y.o.female in no apparent distress Neurologic: Awake, alert, oriented Psych: Bright affect, conversant Respiratory: Breathing even and unlabored. No stridor or wheezing Abdomen: Soft, nontender, nondistended to palpation. Incisions well-healed. No incisional hernias. Port easily palpated. Extremities: Atraumatic, good range of motion.  ASSESMENT: 40 y.o.  female  s/p AP-Standard lap-band.   PLAN: The patient's port was accessed with a 20G Huber needle without difficulty. Clear fluid was aspirated and 0.5 mL saline was added to the port to give a total predicted volume of 6.5 mL. The patient was able to swallow water without difficulty following the procedure and was instructed to take clear liquids for the next 24-48 hours and advance slowly as tolerated.

## 2011-10-02 NOTE — Patient Instructions (Signed)
Take clear liquids for the next 48 hours. Thin protein shakes are ok to start on Saturday evening. Call us if you have persistent vomiting or regurgitation, night cough or reflux symptoms. Return as scheduled or sooner if you notice no changes in hunger/portion sizes.   

## 2011-10-06 ENCOUNTER — Ambulatory Visit (INDEPENDENT_AMBULATORY_CARE_PROVIDER_SITE_OTHER): Payer: BC Managed Care – PPO | Admitting: General Surgery

## 2011-10-06 DIAGNOSIS — R1110 Vomiting, unspecified: Secondary | ICD-10-CM

## 2011-10-06 NOTE — Patient Instructions (Signed)
Liquids for 48 hours then slowly advance diet. Call if you have more nausea and vomiting.

## 2011-10-06 NOTE — Progress Notes (Signed)
Ms. Zaucha had a band adjustment 4 days ago. 2 days ago she began having persistent nausea and vomiting after eating part of a crabcake.  She had 0.5 cc put in her band with her last adjustment. She spoke with Dr. Johna Sheriff and he recommended having that fluid removed.  Exam: Generally-weight 195.6 down to 10.8 pounds since last visit.  HEENT-mucous membranes are dry.  Abdomen-well-healed scars; palpable port right abdominal wall.  Procedure-the port area was sterilely prepped and 0.6 cc of fluid was evacuated. She is able to drink water without difficulty after this.  Assessment: Lapband too tight-fluid has been removed and she is able to tolerate liquids now.  Plan: Scheduled return visit with Dr. Johna Sheriff. Liquids and protein shakes today and slowly advance diet. Return earlier for further nausea and vomiting.

## 2011-10-16 ENCOUNTER — Encounter (INDEPENDENT_AMBULATORY_CARE_PROVIDER_SITE_OTHER): Payer: Self-pay | Admitting: General Surgery

## 2011-10-16 ENCOUNTER — Ambulatory Visit (INDEPENDENT_AMBULATORY_CARE_PROVIDER_SITE_OTHER): Payer: BC Managed Care – PPO | Admitting: General Surgery

## 2011-10-16 VITALS — BP 138/94 | HR 60 | Temp 97.3°F | Resp 16 | Wt 198.4 lb

## 2011-10-16 DIAGNOSIS — E669 Obesity, unspecified: Secondary | ICD-10-CM

## 2011-10-16 NOTE — Progress Notes (Signed)
Patient returns for followup of LAP-BAND placed Apr 15, 2010. She had a fill on October 26 by Mardelle Matte. She developed severe dysphagia and intolerance of liquids and subsequently had 0.6 cc removed. She now feels more hungry. She still fills up with an appropriate amount of food a little more than she is used to and she's getting hungrier a little quicker.  Exam: She appears well. Her weight is actually down 7 pounds from when she had the fill at 198.6 which represents a 111 pound weight loss from surgery. BMI is 31.9 down from 49.9 preoperatively.  Assessment and plan: Despite her recent episode over restriction she is having wonderful success. As her weight is down we elected not to do a fill today but plan short-term followup in 4 weeks.

## 2011-10-20 ENCOUNTER — Ambulatory Visit: Payer: BC Managed Care – PPO | Admitting: *Deleted

## 2011-10-21 ENCOUNTER — Encounter: Payer: BC Managed Care – PPO | Attending: General Surgery | Admitting: *Deleted

## 2011-10-21 ENCOUNTER — Encounter: Payer: Self-pay | Admitting: *Deleted

## 2011-10-21 DIAGNOSIS — Z713 Dietary counseling and surveillance: Secondary | ICD-10-CM | POA: Insufficient documentation

## 2011-10-21 DIAGNOSIS — Z9884 Bariatric surgery status: Secondary | ICD-10-CM | POA: Insufficient documentation

## 2011-10-21 DIAGNOSIS — Z09 Encounter for follow-up examination after completed treatment for conditions other than malignant neoplasm: Secondary | ICD-10-CM | POA: Insufficient documentation

## 2011-10-21 NOTE — Patient Instructions (Signed)
  Goals:  Follow Phase 3B: High Protein + Non-Starchy Vegetables  Eat 3-6 small meals/snacks, every 3-5 hrs  Increase lean protein foods to meet 80g goal  Increase fluid intake to 64oz +  Add 15 grams of carbohydrate (fruit, whole grain, starchy vegetable) with meals  Avoid drinking 15 minutes before, during and 30 minutes after eating  Aim for >30 min of physical activity daily 
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
## 2011-11-09 ENCOUNTER — Telehealth (INDEPENDENT_AMBULATORY_CARE_PROVIDER_SITE_OTHER): Payer: Self-pay

## 2011-11-09 NOTE — Telephone Encounter (Signed)
Left message for patient to call our office for appointment date & time

## 2011-11-12 ENCOUNTER — Encounter (INDEPENDENT_AMBULATORY_CARE_PROVIDER_SITE_OTHER): Payer: Self-pay | Admitting: General Surgery

## 2011-11-12 ENCOUNTER — Ambulatory Visit (INDEPENDENT_AMBULATORY_CARE_PROVIDER_SITE_OTHER): Payer: BC Managed Care – PPO | Admitting: General Surgery

## 2011-11-12 NOTE — Patient Instructions (Signed)
Start solid food cautiously tomorrow and call if there is any problem

## 2011-11-12 NOTE — Progress Notes (Signed)
Patient returns for followup of LAP-BAND. One month ago we had to remove 0.6 cc after and he had added half a cc and she developed persistent vomiting. She now however is eating almost normal amounts, is feeling hungry, and has gained several pounds.  With this information we went ahead with a fill today of 0.35 cc to bring her up to 6.25 cc. She was able to tolerate water well. Reviewed her diet strategy and she is to return in 4 weeks.

## 2011-12-10 ENCOUNTER — Encounter (INDEPENDENT_AMBULATORY_CARE_PROVIDER_SITE_OTHER): Payer: Self-pay | Admitting: General Surgery

## 2011-12-10 ENCOUNTER — Ambulatory Visit (INDEPENDENT_AMBULATORY_CARE_PROVIDER_SITE_OTHER): Payer: BC Managed Care – PPO | Admitting: General Surgery

## 2011-12-10 NOTE — Progress Notes (Signed)
Patient returns for followup of lap band placed April 2011. We've refilled her one month ago after a period of over restriction. She had a fair amount of difficulty with holiday food trying to eat at unusual times and food she had not prepared. She had frequent intolerance and vomiting. However since being back home on her regimen she is doing well. She is able to tolerate a variety of solid protein meals without difficulty. No nighttime reflux or regurgitation. She does have very good restriction and early satiety.  On exam she appears well. Weight is 184 which is remarkable 126 pound weight loss from preop of 310. PMI is 29.5 down from 49.9 preop.  Assessment and plan: Excellent weight loss with lap band. I think she is borderline of restricted but since being back on her routine she is tolerating appropriate amounts of solid food electively things as they are. She will return in 6 weeks. She continues to lose weight we will remove a small amount of fluid and just let her maintain

## 2012-01-21 ENCOUNTER — Ambulatory Visit (INDEPENDENT_AMBULATORY_CARE_PROVIDER_SITE_OTHER): Payer: BC Managed Care – PPO | Admitting: General Surgery

## 2012-01-21 ENCOUNTER — Encounter (INDEPENDENT_AMBULATORY_CARE_PROVIDER_SITE_OTHER): Payer: Self-pay | Admitting: General Surgery

## 2012-01-21 NOTE — Progress Notes (Signed)
History: Patient returns for follow up of lab and placed in May of 2011. She has been questionably a little tight on recent visits but on discussing today it seems she is doing gradually a little better. She is actually eating a variety of solid foods at all meals without difficulty. She only gets into trouble in groups or social situations were she tries to eat too fast. She has some reflux at night but only if she eats late at night which she avoids and it has no trouble.  Exam: Weight is down 5.6 pounds in 6 weeks for now total weight loss of 132 pounds from preop of 310 and a current of 178 and BMI is down from preop of 50-28.8. She appears well.  Assessment and plan: Excellent weight loss post lap band placement. She really only needs to maintain her weight. I will see her back in May for her two-year anniversary. She understands to call me any time for problems.

## 2012-01-26 ENCOUNTER — Encounter: Payer: Self-pay | Admitting: Internal Medicine

## 2012-01-26 ENCOUNTER — Other Ambulatory Visit (INDEPENDENT_AMBULATORY_CARE_PROVIDER_SITE_OTHER): Payer: BC Managed Care – PPO

## 2012-01-26 ENCOUNTER — Ambulatory Visit (INDEPENDENT_AMBULATORY_CARE_PROVIDER_SITE_OTHER): Payer: BC Managed Care – PPO | Admitting: Internal Medicine

## 2012-01-26 DIAGNOSIS — R6889 Other general symptoms and signs: Secondary | ICD-10-CM

## 2012-01-26 DIAGNOSIS — E785 Hyperlipidemia, unspecified: Secondary | ICD-10-CM

## 2012-01-26 DIAGNOSIS — I10 Essential (primary) hypertension: Secondary | ICD-10-CM

## 2012-01-26 DIAGNOSIS — F329 Major depressive disorder, single episode, unspecified: Secondary | ICD-10-CM

## 2012-01-26 LAB — CBC WITH DIFFERENTIAL/PLATELET
Basophils Absolute: 0 10*3/uL (ref 0.0–0.1)
Hemoglobin: 11.3 g/dL — ABNORMAL LOW (ref 12.0–15.0)
Lymphocytes Relative: 39.5 % (ref 12.0–46.0)
Monocytes Relative: 6.5 % (ref 3.0–12.0)
Neutrophils Relative %: 51.4 % (ref 43.0–77.0)
Platelets: 189 10*3/uL (ref 150.0–400.0)
RDW: 15.1 % — ABNORMAL HIGH (ref 11.5–14.6)

## 2012-01-26 MED ORDER — MUPIROCIN 2 % EX OINT: TOPICAL_OINTMENT | CUTANEOUS | Status: DC

## 2012-01-26 MED ORDER — LEVONORG-ETH ESTRAD TRIPHASIC PO TABS: 1.0000 | ORAL_TABLET | Freq: Every day | ORAL | Status: DC

## 2012-01-26 MED ORDER — CITALOPRAM HYDROBROMIDE 40 MG PO TABS: 40.0000 mg | ORAL_TABLET | Freq: Every day | ORAL | Status: DC

## 2012-01-26 NOTE — Assessment & Plan Note (Addendum)
Last lab '12 in normal range w/o medication.  Plan - repeat lab work with recommendations to follow  Addendum - lipid panel reveals normal and excellent values. No need for any medical intervention.

## 2012-01-26 NOTE — Assessment & Plan Note (Signed)
BP Readings from Last 3 Encounters:  01/26/12 116/72  01/21/12 110/80  12/10/11 122/82   Normotensive without medication!!

## 2012-01-26 NOTE — Assessment & Plan Note (Signed)
Doing very well. No problems with medication.

## 2012-01-26 NOTE — Progress Notes (Signed)
Subjective:    Patient ID: Katherine Tucker, female    DOB: [birth year], 41 y.o.   MRN: [id number]  HPI Katherine Tucker presents for routine medical follow-up. She has now lost 132 lbs since bariatric lap band surgery. She is adjusting to her diet and eating needs and is happy with her situation. She is exercising. She has had no major medical illness, injury or surgery  Past Medical History  Diagnosis Date  . Chicken pox   . Anxiety   . Depression   . Headache   . Hyperlipidemia   . Hypertension   . Morbid obesity    Past Surgical History  Procedure Date  . Bladder surgery     childhood  . Lapband procedure 5-11    [place]   Family History  Problem Relation Age of Onset  . Arthritis Mother   . Hypertension Mother   . Hyperlipidemia Mother   . Hypertension Father   . Hyperlipidemia Father   . Cancer Paternal Aunt     breast  . Diabetes Other    History   Social History  . Marital Status: Single    Spouse Name: N/A    Number of Children: N/A  . Years of Education: 32   Occupational History  . lawyer     Mudlogger  . [employer]    Social History Main Topics  . Smoking status: Never Smoker   . Smokeless tobacco: Never Used  . Alcohol Use: No  . Drug Use: No  . Sexually Active: No   Other Topics Concern  . Not on file   Social History Narrative   HSG, Completed Field seismologist, [employer]. 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 Remains active and in touch with her sorority sisters. Her mother lives in [place] and they are close.    Current Outpatient Prescriptions on File Prior to Visit  Medication Sig Dispense Refill  . Calcium 1500 MG tablet Take 1,500 mg by mouth daily.        . COD LIVER OIL PO Take by mouth daily.        . multivitamin (THERAGRAN) tablet Take 1 tablet by mouth daily. [place].          Review of Systems Constitutional:  Negative for  fever, chills, activity change and unexpected weight change.  HEENT:  Negative for hearing loss, ear pain, congestion, neck stiffness and postnasal drip. Negative for sore throat or swallowing problems. Negative for dental complaints.   Eyes: Negative for vision loss or change in visual acuity.  Respiratory: Negative for chest tightness and wheezing. Negative for DOE.   Cardiovascular: Negative for chest pain or palpitations. No decreased exercise tolerance Gastrointestinal: No change in bowel habit. No bloating or gas. No reflux or indigestion Genitourinary: Negative for urgency, frequency, flank pain and difficulty urinating.  Musculoskeletal: Negative for myalgias, back pain, arthralgias and gait problem.  Neurological: Negative for dizziness, tremors, weakness and headaches.  Hematological: Negative for adenopathy.  Psychiatric/Behavioral: Negative for behavioral problems and dysphoric mood.       Objective:   Physical Exam Filed Vitals:   01/26/12 1441  BP: 116/72  Pulse: 58  Temp: 98.4 F (36.9 C)  Resp: 14   Wt Readings from Last 3 Encounters:  01/26/12 180 lb (81.647 kg)  01/21/12 178 lb 3.2 oz (80.831 kg)  12/10/11 183 lb 12.8 oz (83.371 kg)   Gen'l- Well nourished well  developed AA woman in no distress HEENT- [place]/AT, native dentition in good repair, no oral lesions, nares - inflammation and irritation noted w/o frank ulceration. Neck- supple, w/o thyromegaly Nodes - negative cervical and axillary regions Breast exam - normal skin, nipples w/o discharge, minor fibrocystic lesions but no fixed mass or lesions. Pulm - normal breath sounds Cor - 2+ radial and DP pulses, RRR w/o murmurs or rubs Abd- BS+ x 4, lap band port w/o tenderness, no HSM Pelvic deferred to normal exam in '12. Ext - no deformity, normal ROM about small, medium and large joints Neuro - A&O x 3, CN II-XII normal. Derm - clear  Lab Results  Component Value Date   WBC 3.6* 01/26/2012   HGB 11.3*  01/26/2012   HCT 33.4* 01/26/2012   PLT 189.0 01/26/2012   GLUCOSE 77 01/26/2012   CHOL 174 01/26/2012   TRIG 74.0 01/26/2012   HDL 72.20 01/26/2012   LDLCALC 87 01/26/2012        ALT 35 01/26/2012   AST 29 01/26/2012        NA 140 01/26/2012   K 3.6 01/26/2012   CL 108 01/26/2012   CREATININE 1.0 01/26/2012   BUN 15 01/26/2012   CO2 27 01/26/2012   TSH 1.17 01/26/2012   HGBA1C 5.8 02/22/2009           Assessment & Plan:

## 2012-01-26 NOTE — Assessment & Plan Note (Signed)
S/p lap band surgery and a real success! She is at a great weight. Her metabolic diseases have resolved with weight loss. She has a good attitude and knows this is a long-term process but she has really been successful to date.

## 2012-01-27 LAB — HEPATIC FUNCTION PANEL
ALT: 35 U/L (ref 0–35)
AST: 29 U/L (ref 0–37)
Alkaline Phosphatase: 27 U/L — ABNORMAL LOW (ref 39–117)
Bilirubin, Direct: 0 mg/dL (ref 0.0–0.3)
Total Bilirubin: 0.4 mg/dL (ref 0.3–1.2)
Total Protein: 6.5 g/dL (ref 6.0–8.3)

## 2012-01-27 LAB — LIPID PANEL
Cholesterol: 174 mg/dL (ref 0–200)
HDL: 72.2 mg/dL (ref >39.00)
Triglycerides: 74 mg/dL (ref 0.0–149.0)
VLDL: 14.8 mg/dL (ref 0.0–40.0)

## 2012-01-27 LAB — COMPREHENSIVE METABOLIC PANEL
Alkaline Phosphatase: 27 U/L — ABNORMAL LOW (ref 39–117)
BUN: 15 mg/dL (ref 6–23)
GFR: 81.54 mL/min (ref >60.00)
Glucose, Bld: 77 mg/dL (ref 70–99)
Total Bilirubin: 0.4 mg/dL (ref 0.3–1.2)

## 2012-01-27 LAB — TSH: TSH: 1.17 u[IU]/mL (ref 0.35–5.50)

## 2012-01-28 ENCOUNTER — Encounter: Payer: Self-pay | Admitting: Internal Medicine

## 2012-01-29 ENCOUNTER — Encounter: Payer: Self-pay | Admitting: Internal Medicine

## 2012-03-07 ENCOUNTER — Telehealth (INDEPENDENT_AMBULATORY_CARE_PROVIDER_SITE_OTHER): Payer: Self-pay | Admitting: General Surgery

## 2012-05-04 ENCOUNTER — Telehealth (INDEPENDENT_AMBULATORY_CARE_PROVIDER_SITE_OTHER): Payer: Self-pay

## 2012-05-04 NOTE — Telephone Encounter (Signed)
Left voice mail to see if patient would like to see Mardelle Matte in [facility] at 8:30 or 10:00.  Have both times on hold until patient calls office back.  Dr. Owens Shark having to r/s some patients for add on surgery.

## 2012-05-05 ENCOUNTER — Ambulatory Visit (INDEPENDENT_AMBULATORY_CARE_PROVIDER_SITE_OTHER): Payer: BC Managed Care – PPO | Admitting: General Surgery

## 2012-05-05 ENCOUNTER — Encounter (INDEPENDENT_AMBULATORY_CARE_PROVIDER_SITE_OTHER): Payer: Self-pay | Admitting: General Surgery

## 2012-05-05 VITALS — BP 130/90 | HR 78 | Temp 97.8°F | Resp 14 | Ht 66.0 in | Wt 179.0 lb

## 2012-05-05 DIAGNOSIS — Z9884 Bariatric surgery status: Secondary | ICD-10-CM

## 2012-05-05 NOTE — Progress Notes (Signed)
Chief complaint: Followup lap band  History: This returns for followup of lap band now here for 2 year anniversary. She continues to do extremely well.Her weight remains stable at 179 pounds which represents an over 130 pound weight loss from preop. BMI is stable At 28.9. There are still quite a number of foods which is not able to tolerate a she feels that she is able to use a variety of healthy foods and somewhat more of a variety that she could several months ago.  Exam: General: Appears well. Lungs clear without increased work of breathing or wheezing Abdomen soft and nontender. Port site looks fine incision is well-healed without hernias  Assessment and plan: Doing extremely well post Laband with excellent weight loss and resolution of comorbidities of obstructive sleep apnea hypertension. Plan 6 month followup.

## 2012-07-23 ENCOUNTER — Other Ambulatory Visit: Payer: Self-pay | Admitting: Internal Medicine

## 2012-08-01 ENCOUNTER — Encounter: Payer: BC Managed Care – PPO | Attending: General Surgery | Admitting: *Deleted

## 2012-08-01 ENCOUNTER — Encounter: Payer: Self-pay | Admitting: *Deleted

## 2012-08-01 DIAGNOSIS — Z09 Encounter for follow-up examination after completed treatment for conditions other than malignant neoplasm: Secondary | ICD-10-CM | POA: Insufficient documentation

## 2012-08-01 DIAGNOSIS — Z9884 Bariatric surgery status: Secondary | ICD-10-CM | POA: Insufficient documentation

## 2012-08-01 DIAGNOSIS — Z713 Dietary counseling and surveillance: Secondary | ICD-10-CM | POA: Insufficient documentation

## 2012-08-01 NOTE — Progress Notes (Addendum)
  Follow-up visit: 27 Months Post-Operative LAGB Surgery  Medical Nutrition Therapy:  Appt start time: 0815  End time:  915.  Primary concerns today: Post-operative bariatric surgery nutrition management.  Katherine Tucker returns today for f/u with wt loss of 16 lbs since last visit (10/2011).  Unable to tolerate solid food if talking during or immediately after consuming. Discussed different options to replace shake at lunch with solid food. Only does this 4 days/week. Increased stress at work decreases ability to tolerate food immediately after work.  Katherine Tucker is likely not consuming enough calories for high activity level.  Discussed maintenance of 1200 calories/day. Will see her for f/u and repeat Tanita scan in 4 weeks.   Surgery date: 04/15/10 Start weight at [facility]: 312.3 lbs  Weight today: 181.5 lb  Weight change: 16.1 lbs Total weight lost: 130.8 lbs BMI: 29.3 kg/m^2  Weight goal: 180-195 lbs  % Weight goal met: 100%  TANITA  BODY COMP RESULTS  08/01/12   %Fat 36.2%   Fat Mass (lbs) 65.5   Fat Free Mass (lbs) 116.0   Total Body Water (lbs) 85.0   24-hr recall: Katherine Tucker reports eating more Salmon, spinach salads; had been eating [employer] greek yogurt ice cream several times a week until recently d/t stress. Has increased soft protein foods and leafy greens to increase intake.  B (AM): Protein shake (30g) Snk (AM): handful of peanuts  L (PM): Protein shake (30 g) OR Soup, 1/2 cup chili Snk (PM): handful of peanuts OR Skips  D (PM): 3-4 oz lean meat, 1/2 cup vegetable Snk (PM): N/A or sometimes popcorn  Fluid intake: 50-64 oz  Estimated total protein intake: 90-100 g  Medications: See medication list; reconciled with pt.  Supplementation: Taking 100% of the time  Using straws: No Drinking while eating: No Hair loss: No Carbonated beverages: No N/V/D/C: Burping if drinks shake too early in the a.m. or too fast; Vomits if someone talks to her during or right after consuming solid  food Last Lap-Band fill: None recently - last in 11/2011  Recent physical activity:  1 day/week with trainer since March; 5-6 times/week at the gym for 60 minutes (combo of cardio/weights)  Progress Towards Goal(s):  In progress.   Nutritional Diagnosis:  [place]-3.3 Overweight/obesity As related to recent LAGB surgery.  As evidenced by pt following LAGB guidelines for continued weight loss/maintenance.    Intervention:  Nutrition education.  Monitoring/Evaluation:  Dietary intake, exercise, lap band fills, and body weight. Follow up in 4 weeks.

## 2012-08-01 NOTE — Patient Instructions (Addendum)
Goals:  Follow Bariatric Surgery Specialized Post-Op Diet  Try to replace lunch shake with solid food  Always have protein with carbs  Eat 3-6 small meals/snacks, every 3-5 hrs  Increase fluid intake of >64oz   Add 15 grams of carbohydrate (fruit, whole grain, starchy vegetable) with meals  Come back in 4 weeks for rescan on Tanita scale

## 2012-08-29 ENCOUNTER — Encounter: Payer: BC Managed Care – PPO | Attending: General Surgery | Admitting: *Deleted

## 2012-08-29 ENCOUNTER — Encounter: Payer: Self-pay | Admitting: *Deleted

## 2012-08-29 DIAGNOSIS — Z9884 Bariatric surgery status: Secondary | ICD-10-CM | POA: Insufficient documentation

## 2012-08-29 DIAGNOSIS — Z713 Dietary counseling and surveillance: Secondary | ICD-10-CM | POA: Insufficient documentation

## 2012-08-29 DIAGNOSIS — Z09 Encounter for follow-up examination after completed treatment for conditions other than malignant neoplasm: Secondary | ICD-10-CM | POA: Insufficient documentation

## 2012-08-29 NOTE — Progress Notes (Signed)
  Follow-up visit: 30 Months Post-Operative LAGB Surgery  Medical Nutrition Therapy:  Appt start time: 0830  End time:  0900.  Primary concerns today: Post-operative bariatric surgery nutrition management.  Katherine Tucker just wrapped up a very stressful trial. Reports increased stress eating of pringles and other junk foods as well as inconsistent eating pattern d/t time constraints of trial. Back on track now.  Has lost 8.5 lbs FM and looks to be holding onto 7 lbs of fluid d/t excessive popcorn and other higher sodium foods.    Surgery date: 04/15/10 Start weight at [facility]: 312.3 lbs  Weight today: 182.5 lb  Weight change: 1.0 lb GAIN Total weight lost: 129.8 lbs BMI: 29.5 kg/m^2  Weight goal: 175 lbs (adjusted per pt on 08/29/12) % Weight goal met: 94%  TANITA  BODY COMP RESULTS  08/01/12 08/29/12   %Fat 36.2% 31.2%   Fat Mass (lbs) 65.5 57.0   Fat Free Mass (lbs) 116.0 125.5   Total Body Water (lbs) 85.0 92.0   24-hr recall:   B (AM): Protein shake (30g) Snk (AM): Pringles  L (PM): None  Snk (2 PM): 2-3 oz Salmon on salad with egg, mushrooms D (PM): 3 oz lean meat, 1/2 cup vegetable Snk (PM): Popcorn (made at home on stove) - large bowl  Fluid intake: 40-50 oz (decreased d/t recent trial) Estimated total protein intake: 80-90 g  Medications: No changes reported  Supplementation: Taking 100% of the time  Using straws: No Drinking while eating: No Hair loss: No Carbonated beverages: No N/V/D/C: None reported Last Lap-Band fill: None recently - last in 11/2011  Recent physical activity:  Only 1 day in last week;  USUALLY: 1 day/week with trainer since March; 5-6 times/week at the gym for 60 minutes (combo of cardio/weights)  Progress Towards Goal(s):  In progress.   Nutritional Diagnosis:  [place]-3.3 Overweight/obesity As related to recent LAGB surgery.  As evidenced by pt following LAGB guidelines for continued weight loss/maintenance.    Intervention:  Nutrition  education.  Monitoring/Evaluation:  Dietary intake, exercise, lap band fills, and body weight. Follow up in 3 months for 33 month follow up.

## 2012-08-29 NOTE — Patient Instructions (Addendum)
Goals:  Follow Bariatric Surgery Specialized Post-Op Diet  Continue replacing lunch shake with solid food  Always have protein with carbs  Limit pringles :)  Increase fluid intake of >64oz   Add 15 grams of carbohydrate (fruit, whole grain, starchy vegetable) with meals  TANITA  BODY COMP RESULTS  08/01/12 08/29/12   %Fat 36.2% 31.2%   Fat Mass (lbs) 65.5 57.0   Fat Free Mass (lbs) 116.0 125.5   Total Body Water (lbs) 85.0 92.0

## 2012-11-17 ENCOUNTER — Ambulatory Visit (INDEPENDENT_AMBULATORY_CARE_PROVIDER_SITE_OTHER): Payer: BC Managed Care – PPO | Admitting: Physician Assistant

## 2012-11-17 ENCOUNTER — Encounter (INDEPENDENT_AMBULATORY_CARE_PROVIDER_SITE_OTHER): Payer: Self-pay | Admitting: Physician Assistant

## 2012-11-17 DIAGNOSIS — Z9884 Bariatric surgery status: Secondary | ICD-10-CM

## 2012-11-17 NOTE — Patient Instructions (Signed)
Return in six months. Focus on good food choices as well as physical activity. Return sooner if you have an increase in hunger, portion sizes or weight. Return also for difficulty swallowing, night cough, reflux.   

## 2012-11-17 NOTE — Progress Notes (Signed)
  HISTORY: Katherine Tucker is a 41 y.o.female who received an AP-Standard lap-band in May 2011 by Dr. Johna Sheriff. She comes in with about 4 lbs of weight gain since her last visit but down one dress size. She's been working with a Psychologist, educational and has been working closely with Huntley Dec in nutrition. She's lost about 8 lbs in fat weight. She's eating small portions and her hunger is under pretty good control. She's aware of foods that she tolerates well. She doesn't have persistent regurgitation or reflux.  VITAL SIGNS: Filed Vitals:   11/17/12 1202  BP: 126/82  Pulse: 64  Temp: 98.4 F (36.9 C)    PHYSICAL EXAM: Physical exam reveals a very well-appearing 41 y.o.female in no apparent distress Neurologic: Awake, alert, oriented Psych: Bright affect, conversant Respiratory: Breathing even and unlabored. No stridor or wheezing Extremities: Atraumatic, good range of motion. Skin: Warm, Dry, no rashes Musculoskeletal: Normal gait, Joints normal  ASSESMENT: 41 y.o.  female  s/p AP-Standard lap-band.   PLAN: We deferred an adjustment today as she's in the green zone and her overall health picture is good. She's changing body composition without gaining fat pounds. I encouraged her to continue her good habits and to contact us if she notices any issues including increased hunger, portion sizes or regurgitation.

## 2012-11-25 ENCOUNTER — Ambulatory Visit: Payer: BC Managed Care – PPO | Admitting: *Deleted

## 2013-02-01 ENCOUNTER — Other Ambulatory Visit: Payer: Self-pay | Admitting: Internal Medicine

## 2013-02-02 ENCOUNTER — Encounter: Payer: BC Managed Care – PPO | Admitting: Internal Medicine

## 2013-03-13 ENCOUNTER — Ambulatory Visit (INDEPENDENT_AMBULATORY_CARE_PROVIDER_SITE_OTHER): Payer: BC Managed Care – PPO | Admitting: Internal Medicine

## 2013-03-13 ENCOUNTER — Encounter: Payer: Self-pay | Admitting: Internal Medicine

## 2013-03-13 ENCOUNTER — Other Ambulatory Visit (INDEPENDENT_AMBULATORY_CARE_PROVIDER_SITE_OTHER): Payer: BC Managed Care – PPO

## 2013-03-13 VITALS — BP 142/98 | HR 78 | Temp 97.9°F | Ht 66.0 in | Wt 175.8 lb

## 2013-03-13 DIAGNOSIS — F3289 Other specified depressive episodes: Secondary | ICD-10-CM

## 2013-03-13 DIAGNOSIS — N6042 Mammary duct ectasia of left breast: Secondary | ICD-10-CM

## 2013-03-13 DIAGNOSIS — F329 Major depressive disorder, single episode, unspecified: Secondary | ICD-10-CM

## 2013-03-13 DIAGNOSIS — Z0000 Encounter for general adult medical examination without abnormal findings: Secondary | ICD-10-CM

## 2013-03-13 DIAGNOSIS — E785 Hyperlipidemia, unspecified: Secondary | ICD-10-CM

## 2013-03-13 DIAGNOSIS — I10 Essential (primary) hypertension: Secondary | ICD-10-CM

## 2013-03-13 DIAGNOSIS — Z01419 Encounter for gynecological examination (general) (routine) without abnormal findings: Secondary | ICD-10-CM | POA: Insufficient documentation

## 2013-03-13 DIAGNOSIS — N6049 Mammary duct ectasia of unspecified breast: Secondary | ICD-10-CM

## 2013-03-13 DIAGNOSIS — Z124 Encounter for screening for malignant neoplasm of cervix: Secondary | ICD-10-CM

## 2013-03-13 DIAGNOSIS — N643 Galactorrhea not associated with childbirth: Secondary | ICD-10-CM

## 2013-03-13 LAB — LIPID PANEL
Cholesterol: 193 mg/dL (ref 0–200)
HDL: 75.4 mg/dL (ref >39.00)
LDL Cholesterol: 110 mg/dL — ABNORMAL HIGH (ref 0–99)
Triglycerides: 38 mg/dL (ref 0.0–149.0)
VLDL: 7.6 mg/dL (ref 0.0–40.0)

## 2013-03-13 LAB — COMPREHENSIVE METABOLIC PANEL
Albumin: 3.8 g/dL (ref 3.5–5.2)
Alkaline Phosphatase: 35 U/L — ABNORMAL LOW (ref 39–117)
BUN: 15 mg/dL (ref 6–23)
Creatinine, Ser: 1.1 mg/dL (ref 0.4–1.2)
Glucose, Bld: 82 mg/dL (ref 70–99)
Potassium: 4.2 mEq/L (ref 3.5–5.1)
Total Bilirubin: 0.5 mg/dL (ref 0.3–1.2)

## 2013-03-13 LAB — CBC WITH DIFFERENTIAL/PLATELET
Basophils Relative: 0.4 % (ref 0.0–3.0)
Eosinophils Absolute: 0 10*3/uL (ref 0.0–0.7)
MCHC: 33.3 g/dL (ref 30.0–36.0)
MCV: 81.5 fl (ref 78.0–100.0)
Monocytes Absolute: 0.3 10*3/uL (ref 0.1–1.0)
Neutro Abs: 2.2 10*3/uL (ref 1.4–7.7)
Neutrophils Relative %: 56.7 % (ref 43.0–77.0)
RBC: 3.82 Mil/uL — ABNORMAL LOW (ref 3.87–5.11)
RDW: 16.5 % — ABNORMAL HIGH (ref 11.5–14.6)

## 2013-03-13 LAB — HEPATIC FUNCTION PANEL
ALT: 15 U/L (ref 0–35)
Alkaline Phosphatase: 35 U/L — ABNORMAL LOW (ref 39–117)
Bilirubin, Direct: 0.1 mg/dL (ref 0.0–0.3)
Total Bilirubin: 0.5 mg/dL (ref 0.3–1.2)

## 2013-03-13 MED ORDER — FLUCONAZOLE 150 MG PO TABS: 150.0000 mg | ORAL_TABLET | Freq: Once | ORAL | Status: DC

## 2013-03-13 NOTE — Patient Instructions (Addendum)
Thanks for coming in to see me. Sorry for the little PAP brush mishap.  Exam is normal. Routine labs are ordered plus we will check the Prolactin level as follow up for the elevated level in 2009 and a diagnostic mammorgram.  Keep up the great work: diet and exericse.  You will receive a full report and labs will be available on MyChart.

## 2013-03-13 NOTE — Progress Notes (Signed)
Subjective:    Patient ID: Katherine Tucker, female    DOB: 1971/10/24, 42 y.o.   MRN: [id number]  HPI Presents for routine exam. In the interval she has been doing well. No lap band issues and she has eliminated night-time cough by not eating before. She has enjoyed good health  Past Medical History  Diagnosis Date  . Chicken pox   . Anxiety   . Depression   . Headache   . Hyperlipidemia   . Hypertension   . Morbid obesity   . Elevated prolactin level     elevated in 2009. MRI brain normal  . Mammary duct ectasia of right breast 2009    excision of duct - Dr. Davina Poke 2010 - nonmalignant path   Past Surgical History  Procedure Laterality Date  . Bladder surgery      childhood  . Lapband procedure  5-11    [facility]  . Breast lesion excison Right Jan 2010    Dr. Luisa Hart. Ductal ectasia   Family History  Problem Relation Age of Onset  . Arthritis Mother   . Hypertension Mother   . Hyperlipidemia Mother   . Hypertension Father   . Hyperlipidemia Father   . Cancer Paternal Aunt     breast  . Diabetes Other    History   Social History  . Marital Status: Single    Spouse Name: N/A    Number of Children: N/A  . Years of Education: 28   Occupational History  . lawyer     Mudlogger  . [employer]    Social History Main Topics  . Smoking status: Never Smoker   . Smokeless tobacco: Never Used  . Alcohol Use: No  . Drug Use: No  . Sexually Active: No   Other Topics Concern  . Not on file   Social History Narrative   HSG, Completed Field seismologist, [employer]. 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 Remains active and in touch with her sorority sisters. Her mother lives in [place] and they are close.          Current Outpatient Prescriptions on File Prior to Visit  Medication Sig Dispense Refill  . Calcium 1500 MG tablet Take 1,500 mg by mouth daily.        . citalopram  (CELEXA) 40 MG tablet Take 1 tablet (40 mg total) by mouth daily.  30 tablet  10  . COD LIVER OIL PO Take by mouth daily.        Marland Kitchen ENPRESSE-28 tablet TAKE 1 TABLET BY MOUTH DAILY.  28 tablet  11  . multivitamin (THERAGRAN) tablet Take 1 tablet by mouth daily. Centrum [place].      . mupirocin ointment (BACTROBAN) 2 % Apply to nares twice a day for 5 days and then prn  15 g  1   No current facility-administered medications on file prior to visit.       Review of Systems Constitutional:  Negative for fever, chills, activity change and unexpected weight change.  HEENT:  Negative for hearing loss, ear pain, congestion, neck stiffness and postnasal drip. Negative for sore throat or swallowing problems. Negative for dental complaints.   Eyes: Negative for vision loss or change in visual acuity.  Respiratory: Negative for chest tightness and wheezing. Negative for DOE.   Cardiovascular: Negative for chest pain or palpitations. No decreased exercise tolerance Gastrointestinal: No change in bowel habit. No bloating  or gas. No reflux or indigestion Genitourinary: Negative for urgency, frequency, flank pain and difficulty urinating.  Musculoskeletal: Negative for myalgias, back pain, arthralgias and gait problem.  Neurological: Negative for dizziness, tremors, weakness and headaches.  Hematological: Negative for adenopathy.  Psychiatric/Behavioral: Negative for behavioral problems and dysphoric mood.       Objective:   Physical Exam Filed Vitals:   03/13/13 1340  BP: 142/98  Pulse: 78  Temp: 97.9 F (36.6 C)   Wt Readings from Last 3 Encounters:  03/13/13 175 lb 12.8 oz (79.742 kg)  11/17/12 182 lb 9.6 oz (82.827 kg)  08/29/12 182 lb (82.555 kg)   Gen'l: well nourished, well developed AA Woman in no distress HEENT - [place]/AT, EACs/TMs normal, oropharynx with native dentition in good condition, no buccal or palatal lesions, posterior pharynx clear, mucous membranes moist. C&S clear, PERRLA,  fundi - normal Neck - supple, no thyromegaly Nodes- negative submental, cervical, supraclavicular regions Chest - no deformity, no CVAT Lungs - clear without rales, wheezes. No increased work of breathing Breast - - Skin normal, nipples w/o discharge, large areola with some hyperpigmentation, no fixed mass or lesion, no axillary adenopathy. Cardiovascular - regular rate and rhythm, quiet precordium, no murmurs, rubs or gallops, 2+ radial, DP and PT pulses Abdomen - BS+ x 4, no HSM, no guarding or rebound or tenderness Pelvic - NEG/BUS nl, nl entroitus, vaginal mucosa nl but a little dry. Cervix normal. Thick white discharge in posterior fornix Rectal - deferred  Extremities - no clubbing, cyanosis, edema or deformity.  Neuro - A&O x 3, CN II-XII normal, motor strength normal and equal, DTRs 2+ and symmetrical biceps, radial, and patellar tendons. Cerebellar - no tremor, no rigidity, fluid movement and normal gait. Derm - Head, neck, back, abdomen and extremities without suspicious lesions  Lab Results  Component Value Date   WBC 3.9* 03/13/2013   HGB 10.4* 03/13/2013   HCT 31.2* 03/13/2013   PLT 206.0 03/13/2013   GLUCOSE 82 03/13/2013   CHOL 193 03/13/2013   TRIG 38.0 03/13/2013   HDL 75.40 03/13/2013   LDLCALC 110* 03/13/2013        ALT 15 03/13/2013   AST 24 03/13/2013        NA 137 03/13/2013   K 4.2 03/13/2013   CL 102 03/13/2013   CREATININE 1.1 03/13/2013   BUN 15 03/13/2013   CO2 28 03/13/2013   TSH 1.17 01/26/2012   HGBA1C 5.8 02/22/2009         Assessment & Plan:

## 2013-03-14 ENCOUNTER — Other Ambulatory Visit (HOSPITAL_COMMUNITY)
Admission: RE | Admit: 2013-03-14 | Discharge: 2013-03-14 | Disposition: A | Discharge status: Home / Self Care | Payer: BC Managed Care – PPO | Source: Ambulatory Visit | Attending: Internal Medicine | Admitting: Internal Medicine

## 2013-03-14 DIAGNOSIS — Z0000 Encounter for general adult medical examination without abnormal findings: Secondary | ICD-10-CM | POA: Insufficient documentation

## 2013-03-14 NOTE — Assessment & Plan Note (Signed)
Continue to maintain lower weight after lap band surgery.  Plan Continued life-style management with continued slow weight loss.

## 2013-03-14 NOTE — Assessment & Plan Note (Signed)
Doing very well. Her work load can be stressful but she is coping well. Taking time for regular exercise is a great adjunct therapy.  Plan  Continue present regimen: both medical and life-style

## 2013-03-14 NOTE — Assessment & Plan Note (Signed)
Interval history has been benign. Physical exam w/o abnormality. Lab results are reviewed and in normal range including cholesterol levels and blood sugar. Pelivc/PAP done today with results pending. With a history of prolactinemia and breast with ductal ectasia will add prolactic to lab and schedule a diagnostic mammogram (last study in 2009). Immunizations - given Tdap today.  In summary - a very nice woman who has done very well with lap-band surgery. She appears medically stable except for elevation in BP - with follow up as above. She will need repeat general exam in 1 year, PAP in 2-3 years. She will otherwise return as needed.

## 2013-03-14 NOTE — Assessment & Plan Note (Signed)
BP Readings from Last 3 Encounters:  03/13/13 142/98  11/17/12 126/82  05/05/12 130/90   Blood pressure was elevated today:P 150/100 on my exam. She has had a rough day.  Plan Monitor BP at home and record  Call, or MyChart, with results with recommendations to follow  It would not be unusual to need to resume medical therapy, but with less medication since weight loss.

## 2013-03-14 NOTE — Assessment & Plan Note (Signed)
LDL cholesterol is better than goal of 130 or less. She has done well off medication since have Lap band surgery  Plan Continue present diet and exercise regimen.

## 2013-03-20 ENCOUNTER — Encounter: Payer: Self-pay | Admitting: Internal Medicine

## 2013-03-29 ENCOUNTER — Ambulatory Visit (INDEPENDENT_AMBULATORY_CARE_PROVIDER_SITE_OTHER): Payer: BC Managed Care – PPO

## 2013-03-29 DIAGNOSIS — Z23 Encounter for immunization: Secondary | ICD-10-CM

## 2013-04-25 ENCOUNTER — Telehealth: Payer: Self-pay

## 2013-04-25 NOTE — Telephone Encounter (Signed)
Pt calls stating she saw Dr Debby Bud for a physical on 03/13/13 and at that time her blood pressure was 142/98. Her blood pressure today is 158/100. Since her physical she states the lowest blood pressure reading she's gotten is 149/99. She states she's been on blood pressure medication before. She requesting medication or does she need to be seen? Please advise.

## 2013-04-25 NOTE — Telephone Encounter (Signed)
OV pls Thx 

## 2013-04-26 NOTE — Telephone Encounter (Signed)
Phone call to pt stating she will need an office visit. She was transferred to the front desk to schedule.

## 2013-04-28 ENCOUNTER — Encounter: Payer: Self-pay | Admitting: Internal Medicine

## 2013-04-28 ENCOUNTER — Ambulatory Visit (INDEPENDENT_AMBULATORY_CARE_PROVIDER_SITE_OTHER): Payer: BC Managed Care – PPO | Admitting: Internal Medicine

## 2013-04-28 VITALS — BP 148/96 | HR 72 | Temp 98.5°F | Resp 16 | Ht 71.0 in | Wt 173.0 lb

## 2013-04-28 DIAGNOSIS — I10 Essential (primary) hypertension: Secondary | ICD-10-CM

## 2013-04-28 MED ORDER — NEBIVOLOL HCL 5 MG PO TABS: 5.0000 mg | ORAL_TABLET | Freq: Every day | ORAL | Status: DC

## 2013-04-28 NOTE — Assessment & Plan Note (Signed)
Will treat with bystolic 

## 2013-04-28 NOTE — Patient Instructions (Signed)

## 2013-04-28 NOTE — Progress Notes (Signed)
  Subjective:    Patient ID: Katherine Tucker, female    DOB: 1971/04/13, 42 y.o.   MRN: [id number]  Hypertension This is a recurrent problem. The current episode started more than 1 year ago. The problem has been gradually worsening since onset. Associated symptoms include headaches. Pertinent negatives include no anxiety, blurred vision, chest pain, malaise/fatigue, neck pain, orthopnea, palpitations, peripheral edema, PND, shortness of breath or sweats. Agents associated with hypertension include oral contraceptives. Past treatments include nothing. The current treatment provides no improvement. There are no compliance problems.       Review of Systems  Constitutional: Negative.  Negative for malaise/fatigue, diaphoresis and fatigue.  HENT: Negative for neck pain.   Eyes: Negative.  Negative for blurred vision.  Respiratory: Negative.  Negative for apnea, cough, choking, chest tightness, shortness of breath, wheezing and stridor.   Cardiovascular: Negative.  Negative for chest pain, palpitations, orthopnea, leg swelling and PND.  Gastrointestinal: Negative.  Negative for nausea, vomiting, abdominal pain, diarrhea and constipation.  Endocrine: Negative.   Genitourinary: Negative.   Musculoskeletal: Negative.   Skin: Negative.   Allergic/Immunologic: Negative.   Neurological: Positive for dizziness and headaches. Negative for facial asymmetry, weakness, light-headedness and numbness.  Hematological: Negative.   Psychiatric/Behavioral: Negative.        Objective:   Physical Exam  Vitals reviewed. Constitutional: She is oriented to person, place, and time. She appears well-developed and well-nourished. No distress.  HENT:  Head: Normocephalic and atraumatic.  Mouth/Throat: Oropharynx is clear and moist. No oropharyngeal exudate.  Eyes: Conjunctivae are normal. Right eye exhibits no discharge. Left eye exhibits no discharge. No scleral icterus.  Neck: Normal range of motion. Neck  supple. No JVD present. No tracheal deviation present. No thyromegaly present.  Cardiovascular: Normal rate, regular rhythm, normal heart sounds and intact distal pulses.  Exam reveals no gallop and no friction rub.   No murmur heard. Pulmonary/Chest: Effort normal and breath sounds normal. No stridor. No respiratory distress. She has no wheezes. She has no rales. She exhibits no tenderness.  Abdominal: Soft. Bowel sounds are normal. She exhibits no distension and no mass. There is no tenderness. There is no rebound and no guarding.  Musculoskeletal: Normal range of motion. She exhibits no edema and no tenderness.  Lymphadenopathy:    She has no cervical adenopathy.  Neurological: She is oriented to person, place, and time.  Skin: Skin is warm and dry. No rash noted. She is not diaphoretic. No erythema. No pallor.  Psychiatric: She has a normal mood and affect. Her behavior is normal. Judgment and thought content normal.     Lab Results  Component Value Date   WBC 3.9* 03/13/2013   HGB 10.4* 03/13/2013   HCT 31.2* 03/13/2013   PLT 206.0 03/13/2013   GLUCOSE 82 03/13/2013   CHOL 193 03/13/2013   TRIG 38.0 03/13/2013   HDL 75.40 03/13/2013   LDLCALC 110* 03/13/2013   ALT 15 03/13/2013   ALT 15 03/13/2013   AST 24 03/13/2013   AST 24 03/13/2013   NA 137 03/13/2013   K 4.2 03/13/2013   CL 102 03/13/2013   CREATININE 1.1 03/13/2013   BUN 15 03/13/2013   CO2 28 03/13/2013   TSH 1.17 01/26/2012   HGBA1C 5.8 02/22/2009       Assessment & Plan:

## 2013-06-01 ENCOUNTER — Encounter: Payer: Self-pay | Admitting: Internal Medicine

## 2013-06-01 ENCOUNTER — Ambulatory Visit (INDEPENDENT_AMBULATORY_CARE_PROVIDER_SITE_OTHER): Payer: BC Managed Care – PPO | Admitting: Internal Medicine

## 2013-06-01 VITALS — BP 118/80 | HR 52 | Temp 98.2°F | Ht 66.0 in | Wt 173.0 lb

## 2013-06-01 DIAGNOSIS — I10 Essential (primary) hypertension: Secondary | ICD-10-CM

## 2013-06-01 DIAGNOSIS — K219 Gastro-esophageal reflux disease without esophagitis: Secondary | ICD-10-CM

## 2013-06-01 MED ORDER — RANITIDINE HCL 150 MG PO TABS: 150.0000 mg | ORAL_TABLET | Freq: Two times a day (BID) | ORAL | Status: DC

## 2013-06-01 MED ORDER — NEBIVOLOL HCL 5 MG PO TABS: 5.0000 mg | ORAL_TABLET | Freq: Every day | ORAL | Status: DC

## 2013-06-01 NOTE — Patient Instructions (Addendum)
1. Blood pressure - great control. You seem to tolerate bystolic well. Plan Continue. Rx sent in  2. GERD - sounds like classic reflux. Plan  try ranitidine 150 mg twice a day (AM, after supper) first. If this doesn't control symptoms we can use the PPI.   Gastroesophageal Reflux Disease, Adult Gastroesophageal reflux disease (GERD) happens when acid from your stomach flows up into the esophagus. When acid comes in contact with the esophagus, the acid causes soreness (inflammation) in the esophagus. Over time, GERD may create small holes (ulcers) in the lining of the esophagus. CAUSES   Increased body weight. This puts pressure on the stomach, making acid rise from the stomach into the esophagus.  Smoking. This increases acid production in the stomach.  Drinking alcohol. This causes decreased pressure in the lower esophageal sphincter (valve or ring of muscle between the esophagus and stomach), allowing acid from the stomach into the esophagus.  Late evening meals and a full stomach. This increases pressure and acid production in the stomach.  A malformed lower esophageal sphincter. Sometimes, no cause is found. SYMPTOMS   Burning pain in the lower part of the mid-chest behind the breastbone and in the mid-stomach area. This may occur twice a week or more often.  Trouble swallowing.  Sore throat.  Dry cough.  Asthma-like symptoms including chest tightness, shortness of breath, or wheezing. DIAGNOSIS  Your caregiver may be able to diagnose GERD based on your symptoms. In some cases, X-rays and other tests may be done to check for complications or to check the condition of your stomach and esophagus. TREATMENT  Your caregiver may recommend over-the-counter or prescription medicines to help decrease acid production. Ask your caregiver before starting or adding any new medicines.  HOME CARE INSTRUCTIONS   Change the factors that you can control. Ask your caregiver for guidance  concerning weight loss, quitting smoking, and alcohol consumption.  Avoid foods and drinks that make your symptoms worse, such as:  Caffeine or alcoholic drinks.  Chocolate.  Peppermint or mint flavorings.  Garlic and onions.  Spicy foods.  Citrus fruits, such as oranges, lemons, or limes.  Tomato-based foods such as sauce, chili, salsa, and pizza.  Fried and fatty foods.  Avoid lying down for the 3 hours prior to your bedtime or prior to taking a nap.  Eat small, frequent meals instead of large meals.  Wear loose-fitting clothing. Do not wear anything tight around your waist that causes pressure on your stomach.  Raise the head of your bed 6 to 8 inches with wood blocks to help you sleep. Extra pillows will not help.  Only take over-the-counter or prescription medicines for pain, discomfort, or fever as directed by your caregiver.  Do not take aspirin, ibuprofen, or other nonsteroidal anti-inflammatory drugs (NSAIDs). SEEK IMMEDIATE MEDICAL CARE IF:   You have pain in your arms, neck, jaw, teeth, or back.  Your pain increases or changes in intensity or duration.  You develop nausea, vomiting, or sweating (diaphoresis).  You develop shortness of breath, or you faint.  Your vomit is green, yellow, black, or looks like coffee grounds or blood.  Your stool is red, bloody, or black. These symptoms could be signs of other problems, such as heart disease, gastric bleeding, or esophageal bleeding. MAKE SURE YOU:   Understand these instructions.  Will watch your condition.  Will get help right away if you are not doing well or get worse. Document Released: 09/02/2005 Document Revised: 02/15/2012 Document Reviewed: 06/12/2011 ExitCare  Patient Information 2014 [employer].  

## 2013-06-01 NOTE — Progress Notes (Signed)
  Subjective:    Patient ID: Katherine Tucker, female    DOB: 11/14/1971, 42 y.o.   MRN: [id number]  HPI Katherine Tucker presents for BP follow up.  She had clear consistent elevation of BP. She saw Dr. Yetta Barre and was started on Bystolic - this has been very well tolerated with great control.  She has had increase problem with acid reflux. She  Has found associated foods that cause problems.  PMH, FamHx and SocHx reviewed for any changes and relevance. Current Outpatient Prescriptions on File Prior to Visit  Medication Sig Dispense Refill  . Calcium 1500 MG tablet Take 1,500 mg by mouth daily.        . citalopram (CELEXA) 40 MG tablet Take 1 tablet (40 mg total) by mouth daily.  30 tablet  10  . COD LIVER OIL PO Take by mouth daily.        Marland Kitchen ENPRESSE-28 tablet TAKE 1 TABLET BY MOUTH DAILY.  28 tablet  11  . fluconazole (DIFLUCAN) 150 MG tablet Take 1 tablet (150 mg total) by mouth once.  1 tablet  1  . multivitamin (THERAGRAN) tablet Take 1 tablet by mouth daily. Centrum [place].      . mupirocin ointment (BACTROBAN) 2 % Apply to nares twice a day for 5 days and then prn  15 g  1   No current facility-administered medications on file prior to visit.      Review of Systems System review is negative for any constitutional, cardiac, pulmonary, GI or neuro symptoms or complaints other than as described in the HPI.      Objective:   Physical Exam Filed Vitals:   06/01/13 1116  BP: 118/80  Pulse: 52  Temp: 98.2 F (36.8 C)   Wt Readings from Last 3 Encounters:  06/01/13 173 lb (78.472 kg)  04/28/13 173 lb (78.472 kg)  03/13/13 175 lb 12.8 oz (79.742 kg)   BP Readings from Last 3 Encounters:  06/01/13 118/80  04/28/13 148/96  03/13/13 142/98   Gen'l - WNWD AA woman in no distress very nicely groomed HEENT- C&S clear, PERRLA Cor- RRR Pulm - normal  Neuro normal       Assessment & Plan:

## 2013-06-04 DIAGNOSIS — K219 Gastro-esophageal reflux disease without esophagitis: Secondary | ICD-10-CM | POA: Insufficient documentation

## 2013-06-04 NOTE — Assessment & Plan Note (Signed)
Recent on-set of symptoms. She has identified food triggers and has read a lot about "lapbanders" and reflux. She did get relief with PPI - otc prilosec  Plan Trial of ranitidine 150 mg bid.

## 2013-06-04 NOTE — Assessment & Plan Note (Signed)
BP Readings from Last 3 Encounters:  06/01/13 118/80  04/28/13 148/96  03/13/13 142/98   Doing very well with bystoic - no adverse side effects  Plan Continue present regimen

## 2013-06-24 ENCOUNTER — Other Ambulatory Visit: Payer: Self-pay | Admitting: Internal Medicine

## 2013-07-12 ENCOUNTER — Encounter: Payer: Self-pay | Admitting: Internal Medicine

## 2013-07-12 DIAGNOSIS — I10 Essential (primary) hypertension: Secondary | ICD-10-CM

## 2013-07-12 MED ORDER — NEBIVOLOL HCL 5 MG PO TABS: 5.0000 mg | ORAL_TABLET | Freq: Every day | ORAL | Status: DC

## 2013-08-02 ENCOUNTER — Encounter (INDEPENDENT_AMBULATORY_CARE_PROVIDER_SITE_OTHER): Payer: Self-pay | Admitting: General Surgery

## 2013-08-02 ENCOUNTER — Ambulatory Visit (INDEPENDENT_AMBULATORY_CARE_PROVIDER_SITE_OTHER): Payer: BC Managed Care – PPO | Admitting: General Surgery

## 2013-08-02 ENCOUNTER — Other Ambulatory Visit (INDEPENDENT_AMBULATORY_CARE_PROVIDER_SITE_OTHER): Payer: Self-pay | Admitting: General Surgery

## 2013-08-02 VITALS — BP 125/72 | HR 66 | Temp 98.6°F | Resp 12 | Ht 66.0 in | Wt 180.4 lb

## 2013-08-02 DIAGNOSIS — Z9884 Bariatric surgery status: Secondary | ICD-10-CM

## 2013-08-02 DIAGNOSIS — E65 Localized adiposity: Secondary | ICD-10-CM

## 2013-08-02 NOTE — Progress Notes (Signed)
Chief complaint: Followup lap band  History: Patient returns for followup of lap band placed in May of 2011. I have not seen her since December of 2013. She sees Dr. Arthur Holms regularly.  In the spring of this year she developed some persistent reflux and was placed on Zantac. This essentially relieved her symptoms. However prior to this she was having to sleep sitting up and would have occasional regurgitation and sour taste of food that she the day before. In the last couple of months she is concerned that she is able to eat increasing volumes. She will sometimes eat a normal serving of pasta which she has not been able to do before. She feels her weight has gone up some over the last few months although as below it is stable or down from her last visit here. She is wondering whether she needs a fill.  Exam: BP 125/72  Pulse 66  Temp(Src) 98.6 F (37 C) (Temporal)  Resp 12  Ht 5\' 6"  (1.676 m)  Wt 180 lb 6.4 oz (81.829 kg)  BMI 29.13 kg/m2 Total weight loss 130 pounds, 3 pounds since last visit Well-appearing African American female Abdomen: Soft and nontender. Incisions well healed. No hernias. Port site looks fine.  Assessment and plan: Status post lap band with exceptional weight loss. She has developed some reflux that would suggest over restriction but also some increased portions that would suggest under restriction. As we seemed to have some conflicting data I recommended we get a barium swallow to check the position and tightness of her band and rule out esophageal dilatation. I gave her default appointment for 3 months but I will call her with results of the study and we can get her back in sooner if needed.

## 2013-08-02 NOTE — Patient Instructions (Addendum)
I will call you with the results of your swallow study and we will decide  followup at that time.

## 2013-08-03 ENCOUNTER — Telehealth (INDEPENDENT_AMBULATORY_CARE_PROVIDER_SITE_OTHER): Payer: Self-pay | Admitting: General Surgery

## 2013-08-03 NOTE — Telephone Encounter (Signed)
LMOM to let patient know that she has a upcoming apt for DG Esophagus at the [facility] on 08-10-13 @ 11:15 for a 11:30.

## 2013-08-10 ENCOUNTER — Ambulatory Visit
Admission: RE | Admit: 2013-08-10 | Discharge: 2013-08-10 | Disposition: A | Discharge status: Home / Self Care | Payer: BC Managed Care – PPO | Source: Ambulatory Visit | Attending: General Surgery | Admitting: General Surgery

## 2013-08-11 ENCOUNTER — Telehealth (INDEPENDENT_AMBULATORY_CARE_PROVIDER_SITE_OTHER): Payer: Self-pay | Admitting: General Surgery

## 2013-08-11 NOTE — Telephone Encounter (Signed)
Called the patient and discussed the results of barium swallow. She will come into the office next week.

## 2013-08-14 ENCOUNTER — Telehealth (INDEPENDENT_AMBULATORY_CARE_PROVIDER_SITE_OTHER): Payer: Self-pay

## 2013-08-14 NOTE — Telephone Encounter (Signed)
Called and left message for patient to call office to schedule appt for lap band adjustment per Dr. Johna Sheriff.  Patient scheduled for Friday 08/18/13 but, would like to move patient to earlier date & time.

## 2013-08-16 ENCOUNTER — Encounter (INDEPENDENT_AMBULATORY_CARE_PROVIDER_SITE_OTHER): Payer: Self-pay | Admitting: General Surgery

## 2013-08-16 ENCOUNTER — Ambulatory Visit (INDEPENDENT_AMBULATORY_CARE_PROVIDER_SITE_OTHER): Payer: BC Managed Care – PPO | Admitting: General Surgery

## 2013-08-16 VITALS — BP 128/72 | HR 68 | Temp 98.4°F | Resp 14 | Ht 66.0 in | Wt 179.6 lb

## 2013-08-16 DIAGNOSIS — Z9884 Bariatric surgery status: Secondary | ICD-10-CM

## 2013-08-16 NOTE — Progress Notes (Signed)
Chief complaint: Followup lap band  History: Patient returns for short-term followup for her lap band. At her visit on August 27 I was concerned about possible over restriction due to reflux and occasional regurgitation. On the other hand she was occasionally eating larger volumes. We obtained a barium swallow which I reviewed and this shows some significant esophageal dilatation with minimal passage of contrast up the band. She states that today particularly she has noticed some regurgitation and even occasionally some difficulty with fluids over the last couple of days.  Exam: BP 128/72  Pulse 68  Temp(Src) 98.4 F (36.9 C) (Temporal)  Resp 14  Ht 5\' 6"  (1.676 m)  Wt 179 lb 9.6 oz (81.466 kg)  BMI 29 kg/m2  LMP 07/17/2013 Total weight loss 131 pounds, 1 pounds since last visit  General: Appears well in no distress Abdomen: Soft and nontender  Assessment and plan: Her band appears to tight with esophageal dilatation and now also symptoms of over restriction. I removed 1 cc from her band.Sipping water in the office she felt there was some definite improvement. She will contact us in 2 days to let us know that she is doing better and we will see her no later than 4 weeks

## 2013-08-16 NOTE — Patient Instructions (Addendum)
Please call me Friday morning to let you know how you're doing. As long as you're feeling better we will plan followup in 4 weeks or sooner as necessary.

## 2013-08-18 ENCOUNTER — Encounter (INDEPENDENT_AMBULATORY_CARE_PROVIDER_SITE_OTHER): Payer: BC Managed Care – PPO | Admitting: General Surgery

## 2013-08-18 ENCOUNTER — Telehealth (INDEPENDENT_AMBULATORY_CARE_PROVIDER_SITE_OTHER): Payer: Self-pay | Admitting: *Deleted

## 2013-08-18 NOTE — Telephone Encounter (Signed)
Patient called to let Christy CMA and [facility] MD know that she is feeling much better now that she had some fluid removed from her band.  Patient states all symptoms have resolved.  Explained that a message will be sent to them to make them aware.

## 2013-09-12 ENCOUNTER — Encounter (HOSPITAL_COMMUNITY): Payer: Self-pay | Admitting: *Deleted

## 2013-09-14 ENCOUNTER — Ambulatory Visit (INDEPENDENT_AMBULATORY_CARE_PROVIDER_SITE_OTHER): Payer: BC Managed Care – PPO | Admitting: General Surgery

## 2013-09-14 ENCOUNTER — Encounter (INDEPENDENT_AMBULATORY_CARE_PROVIDER_SITE_OTHER): Payer: Self-pay | Admitting: General Surgery

## 2013-09-14 VITALS — BP 124/82 | HR 68 | Temp 98.0°F | Resp 14 | Ht 66.0 in | Wt 188.0 lb

## 2013-09-14 DIAGNOSIS — Z4651 Encounter for fitting and adjustment of gastric lap band: Secondary | ICD-10-CM

## 2013-09-14 NOTE — Patient Instructions (Signed)
Liquid diet only today then begin solid food cautiously tomorrow.

## 2013-09-14 NOTE — Progress Notes (Signed)
Chief complaint: Followup lap band and esophageal dilatation  History: Patient returns for followup lab and placed in a 2011. She has had remarkable weight loss but a couple months ago developed progressive regurgitation and heartburn. Imaging showed significant esophageal dilatation and we removed 1 cc from her band one month ago. This immediately relieved all of her heartburn symptoms. However she has been able to keep much more than she had previously and has of course but also weight and is concerned about this. She feels like she would like to get a fill and get back on track.  Exam: BP 124/82  Pulse 68  Temp(Src) 98 F (36.7 C) (Temporal)  Resp 14  Ht 5\' 6"  (1.676 m)  Wt 188 lb (85.276 kg)  BMI 30.36 kg/m2 Total weight loss 122 pounds, uptake pounds from one month ago General: Appears well Abdomen: Soft and nontender.  Assessment and plan: Status post lap band with recent over restriction and esophageal dilatation clinically resolved. She has expected weight gain. We elected to go ahead with a fill today.  We added one half cc to bring her up to 5.75 cc. She was too tight at 6.25 and will likely be okay at this volume or possibly at 6 cc. We reviewed diet and exercise stretches. She will return for followup in 6 weeks.

## 2013-09-20 ENCOUNTER — Encounter: Payer: Self-pay | Admitting: Internal Medicine

## 2013-09-20 ENCOUNTER — Ambulatory Visit (INDEPENDENT_AMBULATORY_CARE_PROVIDER_SITE_OTHER): Payer: BC Managed Care – PPO | Admitting: Internal Medicine

## 2013-09-20 VITALS — BP 128/88 | HR 57 | Temp 98.8°F | Wt 186.8 lb

## 2013-09-20 DIAGNOSIS — H6590 Unspecified nonsuppurative otitis media, unspecified ear: Secondary | ICD-10-CM | POA: Insufficient documentation

## 2013-09-20 MED ORDER — AMOXICILLIN 250 MG/5ML PO SUSR: 500.0000 mg | Freq: Three times a day (TID) | ORAL | Status: DC

## 2013-09-20 NOTE — Progress Notes (Signed)
Subjective:     Patient ID: Katherine Tucker, female   DOB: 05-Aug-1971, 42 y.o.   MRN: [id number]  HPI Katherine Tucker presents with a 5 day history of sore throat and congestion. Over the past day, she has developed ear pain. It hurts to swallow. She also had an off-and-on headache. She has tried taking loratadine and aleve, which didn't help enough. She also vomited once suddenly; she states that this was while she was in a car and was due to a severe headache. She also vomited once last night. Loss of appetite. She has also had myalgias and night sweats. The congestion is improving No nausea. No fever or chills. Cough drops have helped some with the sore throat.   She is status post bariatric surgery 3 years ago. On 10/9, she saw her surgeon who tightened her gastric band. She states the vomiting doesn't feel like how it does when she vomits from eating too mch with the gastric band. She has also discontinued her ranitidine because with adjustment of the gastric band, her reflux symptoms have resolved.   Review of Systems General: No major weight changes.  Abdominal: No diarrhea. Some constipation. Pulm: No chest tightness or wheezing. No cough.     Objective:   Physical Exam Filed Vitals:   09/20/13 1122  BP: 128/88  Pulse: 57  Temp: 98.8 F (37.1 C)   General: sitting in the exam room in NAD.  CV: RRR, no murmurs or rubs.  Pulm: CTAB, no wheezes or crackles  Abdominal: Normal active bowel sounds. No tenderness to palpation.  HEENT: Some tender cervical LAD. Left TM had some bubbles, no erythema. Right TM was erythematous and bulging. No oropharyngeal erythema or exudates. Some tenderness to palpation over the frontal and maxillary sinuses.   Current Outpatient Prescriptions on File Prior to Visit  Medication Sig Dispense Refill  . Calcium 1500 MG tablet Take 1,500 mg by mouth daily.        . citalopram (CELEXA) 40 MG tablet Take 1 tablet (40 mg total) by mouth daily.  30 tablet  10   . citalopram (CELEXA) 40 MG tablet TAKE 1 TABLET EVERY DAY  30 tablet  10  . COD LIVER OIL PO Take by mouth daily.        Marland Kitchen ENPRESSE-28 tablet TAKE 1 TABLET BY MOUTH DAILY.  28 tablet  11  . fluconazole (DIFLUCAN) 150 MG tablet Take 1 tablet (150 mg total) by mouth once.  1 tablet  1  . multivitamin (THERAGRAN) tablet Take 1 tablet by mouth daily. Centrum [place].      . mupirocin ointment (BACTROBAN) 2 % Apply to nares twice a day for 5 days and then prn  15 g  1  . nebivolol (BYSTOLIC) 5 MG tablet Take 1 tablet (5 mg total) by mouth daily.  90 tablet  3  . ranitidine (ZANTAC) 150 MG tablet Take 1 tablet (150 mg total) by mouth 2 (two) times daily.  60 tablet  11   No current facility-administered medications on file prior to visit.      Assessment:     Katherine Tucker is a 42 year old woman who presents with sinus pressure, HA, malaise, myalgias, loss of appetite, 2 episodes of vomiting, sore throat, night sweats, and ear pain. This is most likely a sinusitis with a secondary AOM.     Plan:     See plan by problem list.

## 2013-09-20 NOTE — Assessment & Plan Note (Signed)
Given the severity of her symptoms with night sweats, myalgias, and vomiting, and the clear physical exam findings of right ear acute otitis media, will start antibiotics.   Plan:  -Amoxicillin (250 mg/57mls), 10 ccs three times daily for 7 days.  -Symptomatic care with vaporizer, tylenol, and sudafed as needed.

## 2013-09-20 NOTE — Patient Instructions (Signed)
For acute otitis media:  Take amoxicillin elixir, 10 ccs (10 ccs = 10 milliliters) three times a day for 7 days.   To help with the symptoms:  Use your vaporizer. Take tylenol 1-2 pills three times a day for fever and headache. Feel free to use sudafed to help with congestion.

## 2013-09-21 ENCOUNTER — Ambulatory Visit: Payer: BC Managed Care – PPO | Admitting: Internal Medicine

## 2013-10-12 ENCOUNTER — Other Ambulatory Visit: Payer: Self-pay

## 2013-10-26 ENCOUNTER — Ambulatory Visit (INDEPENDENT_AMBULATORY_CARE_PROVIDER_SITE_OTHER): Payer: BC Managed Care – PPO | Admitting: General Surgery

## 2013-10-26 ENCOUNTER — Encounter (INDEPENDENT_AMBULATORY_CARE_PROVIDER_SITE_OTHER): Payer: Self-pay | Admitting: General Surgery

## 2013-10-26 VITALS — BP 130/84 | HR 64 | Temp 98.4°F | Resp 14 | Ht 66.0 in | Wt 185.2 lb

## 2013-10-26 DIAGNOSIS — Z9884 Bariatric surgery status: Secondary | ICD-10-CM

## 2013-10-26 NOTE — Progress Notes (Signed)
Chief complaint: Followup lap band  History: Patient returns for long-term followup for morbid obesity and lap band with surgery date of May 2011. In September of this year she presented with increasing reflux an upper GI series showing esophageal dilatation. We removed 1 cc of fluid at that time. Subsequently she had complete relief of her heartburn and reflux and regurgitation. We added half a cc back 6 weeks ago. Since that time she has lost 3 pounds from 188-185. She is not experiencing any heartburn or reflux on no medications. She feels recently well restricted but is using some willpower by her report as well.  Exam: BP 130/84  Pulse 64  Temp(Src) 98.4 F (36.9 C) (Temporal)  Resp 14  Ht 5\' 6"  (1.676 m)  Wt 185 lb 3.2 oz (84.006 kg)  BMI 29.91 kg/m2 Total weight loss 125 pounds, 3 pounds from last visit General: Appears well  Assessment and plan: Status post lap band with excellent weight loss. At this point she is maintaining or gradually losing without any symptoms of over restriction. We elected to keep things as they are. She can call at any point otherwise I will plan to see her in 4 months.

## 2013-12-05 ENCOUNTER — Encounter: Payer: Self-pay | Admitting: Family Medicine

## 2013-12-05 ENCOUNTER — Ambulatory Visit (INDEPENDENT_AMBULATORY_CARE_PROVIDER_SITE_OTHER): Payer: BC Managed Care – PPO | Admitting: Family Medicine

## 2013-12-05 VITALS — BP 131/87 | HR 77 | Temp 99.0°F | Resp 18 | Ht 66.0 in | Wt 192.0 lb

## 2013-12-05 DIAGNOSIS — J029 Acute pharyngitis, unspecified: Secondary | ICD-10-CM

## 2013-12-05 DIAGNOSIS — J111 Influenza due to unidentified influenza virus with other respiratory manifestations: Secondary | ICD-10-CM

## 2013-12-05 DIAGNOSIS — J019 Acute sinusitis, unspecified: Secondary | ICD-10-CM

## 2013-12-05 DIAGNOSIS — J0100 Acute maxillary sinusitis, unspecified: Secondary | ICD-10-CM | POA: Insufficient documentation

## 2013-12-05 MED ORDER — AMOXICILLIN-POT CLAVULANATE 400-57 MG/5ML PO SUSR: ORAL | Status: DC

## 2013-12-05 MED ORDER — FLUCONAZOLE 150 MG PO TABS: 150.0000 mg | ORAL_TABLET | Freq: Once | ORAL | Status: DC

## 2013-12-05 MED ORDER — HYDROCODONE-HOMATROPINE 5-1.5 MG/5ML PO SYRP: 5.0000 mL | ORAL_SOLUTION | Freq: Four times a day (QID) | ORAL | Status: DC | PRN

## 2013-12-05 NOTE — Progress Notes (Signed)
Pre visit review using our clinic review tool, if applicable. No additional management support is needed unless otherwise documented below in the visit note. OFFICE NOTE  12/05/2013  CC:  Chief Complaint  Patient presents with  . Sore Throat    x Saturday  . Nasal Congestion  . Otalgia    not really painful, just popping     HPI: Patient is a 42 y.o. African-American female who is here for sore throat.  Diffuse body aches. Onset 4 d/a nasal congestion, ST, lots of coughing, some wheezing, chest painful with cough.  Slight subjective fever on day 1 of illness.  Ears popping some with this, too.  Recalls recent AOM 09/2013. Taking theraflu and dayquil round the clock, not helping.  No flu vaccine this season.  Pertinent PMH:  Past Medical History  Diagnosis Date  . Chicken pox   . Anxiety   . Depression   . Headache(784.0)   . Hyperlipidemia   . Hypertension   . Morbid obesity   . Elevated prolactin level     elevated in 2009. MRI brain normal  . Mammary duct ectasia of right breast 2009    excision of duct - Dr. Davina Poke 2010 - nonmalignant path   Past Surgical History  Procedure Laterality Date  . Bladder surgery      childhood  . Lapband procedure  5-11    [facility]  . Breast lesion excison Right Jan 2010    Dr. Luisa Hart. Ductal ectasia     MEDS: Not taking amoxil or diflucan listed below Outpatient Prescriptions Prior to Visit  Medication Sig Dispense Refill  . Calcium 1500 MG tablet Take 1,500 mg by mouth daily.        . citalopram (CELEXA) 40 MG tablet Take 1 tablet (40 mg total) by mouth daily.  30 tablet  10  . COD LIVER OIL PO Take by mouth daily.        Marland Kitchen ENPRESSE-28 tablet TAKE 1 TABLET BY MOUTH DAILY.  28 tablet  11  . multivitamin (THERAGRAN) tablet Take 1 tablet by mouth daily. Centrum [place].      . nebivolol (BYSTOLIC) 5 MG tablet Take 1 tablet (5 mg total) by mouth daily.  90 tablet  3  . citalopram (CELEXA) 40 MG tablet TAKE 1 TABLET EVERY DAY  30  tablet  10  . fluconazole (DIFLUCAN) 150 MG tablet Take 1 tablet (150 mg total) by mouth once.  1 tablet  1  . ranitidine (ZANTAC) 150 MG tablet Take 1 tablet (150 mg total) by mouth 2 (two) times daily.  60 tablet  11  . amoxicillin (AMOXIL) 250 MG/5ML suspension Take 10 mLs (500 mg total) by mouth 3 (three) times daily.  200 mL  0  . mupirocin ointment (BACTROBAN) 2 % Apply to nares twice a day for 5 days and then prn  15 g  1   No facility-administered medications prior to visit.    PE: Blood pressure 131/87, pulse 77, temperature 99 F (37.2 C), temperature source Temporal, resp. rate 18, height 5\' 6"  (1.676 m), weight 192 lb (87.091 kg), last menstrual period 11/15/2013, SpO2 100.00%. VS: noted--normal. Gen: alert, NAD, NONTOXIC APPEARING. HEENT: eyes without injection, drainage, or swelling.  Ears: EACs clear, TMs with normal light reflex and landmarks.  Nose: Clear rhinorrhea, with some dried, crusty exudate adherent to mildly injected mucosa.  No purulent d/c.  Diffuse paranasal sinus TTP.  No facial swelling.  Throat and mouth without focal lesion.  No pharyngial swelling, erythema, or exudate.   Neck: supple, no LAD.   LUNGS: CTA bilat, nonlabored resps.   CV: RRR, no m/r/g. EXT: no c/c/e SKIN: no rash  LAB: rapid strep NEG  IMPRESSION AND PLAN:  Influenza-like illness, with likely a secondary bacterial sinusitis. Plan: Augmentin suspension, hycodan susp. Diflucan rx RF'd due to hx of recurrent yeast vaginitis with abx. Throat culture sent.  FOLLOW UP: prn

## 2013-12-05 NOTE — Progress Notes (Signed)
Pre visit review using our clinic review tool, if applicable. No additional management support is needed unless otherwise documented below in the visit note. 

## 2013-12-07 LAB — CULTURE, GROUP A STREP: Organism ID, Bacteria: NORMAL

## 2014-01-17 ENCOUNTER — Other Ambulatory Visit: Payer: Self-pay | Admitting: Internal Medicine

## 2014-01-24 ENCOUNTER — Encounter (INDEPENDENT_AMBULATORY_CARE_PROVIDER_SITE_OTHER): Payer: Self-pay | Admitting: General Surgery

## 2014-03-15 ENCOUNTER — Ambulatory Visit (INDEPENDENT_AMBULATORY_CARE_PROVIDER_SITE_OTHER): Payer: BC Managed Care – PPO | Admitting: General Surgery

## 2014-03-15 ENCOUNTER — Encounter (INDEPENDENT_AMBULATORY_CARE_PROVIDER_SITE_OTHER): Payer: Self-pay | Admitting: General Surgery

## 2014-03-15 VITALS — BP 116/78 | HR 72 | Temp 97.7°F | Resp 16 | Ht 66.0 in | Wt 193.6 lb

## 2014-03-15 DIAGNOSIS — E669 Obesity, unspecified: Secondary | ICD-10-CM

## 2014-03-15 DIAGNOSIS — Z4651 Encounter for fitting and adjustment of gastric lap band: Secondary | ICD-10-CM

## 2014-03-15 NOTE — Progress Notes (Signed)
Chief complaint: Followup lap band  History: Patient returns for followup status post lap band placement in May 2011. I last saw her in November in followup for a fill at which point she has lost several pounds and was doing well. In recent months she has noted definite easing of restriction where she is able to eat clearly more food and thinks that she could not eat previously. She will have some reflux at night if she eats too soon before going to bed but otherwise no significant vomiting or regurgitation. Weight is up several pounds from last visit  Exam: BP 116/78  Pulse 72  Temp(Src) 97.7 F (36.5 C) (Temporal)  Resp 16  Ht 5\' 6"  (1.676 m)  Wt 193 lb 9.6 oz (87.816 kg)  BMI 31.26 kg/m2 Weight loss 116 pounds, up to 8 pounds from last visit in November General: Appears well Abdomen: Soft and nontender. Port site looks fine  Physical plan: With this information we elected to go ahead with a fill today. The port was accessed without difficulty and 0.25 cc added to bring her up to an expected volume of 6 cc. She was able to tolerate water well. I told her if this does not increase her restriction to call me back with a couple of weeks and we could get her back in for an additional small fill. Otherwise return in one year at the latest

## 2014-03-15 NOTE — Patient Instructions (Signed)
Liquids only today and begin solid foods cautiously tomorrow.  If you do not notice some increase in restriction call back and we can get you back in for another fill

## 2014-03-16 ENCOUNTER — Encounter: Payer: BC Managed Care – PPO | Admitting: Internal Medicine

## 2014-03-16 DIAGNOSIS — Z0289 Encounter for other administrative examinations: Secondary | ICD-10-CM

## 2014-03-28 ENCOUNTER — Other Ambulatory Visit: Payer: Self-pay | Admitting: *Deleted

## 2014-03-28 DIAGNOSIS — I10 Essential (primary) hypertension: Secondary | ICD-10-CM

## 2014-03-28 MED ORDER — NEBIVOLOL HCL 5 MG PO TABS: 5.0000 mg | ORAL_TABLET | Freq: Every day | ORAL | Status: DC

## 2014-05-24 ENCOUNTER — Encounter: Payer: Self-pay | Admitting: Internal Medicine

## 2014-05-24 ENCOUNTER — Ambulatory Visit (INDEPENDENT_AMBULATORY_CARE_PROVIDER_SITE_OTHER): Payer: BC Managed Care – PPO | Admitting: Internal Medicine

## 2014-05-24 ENCOUNTER — Other Ambulatory Visit (INDEPENDENT_AMBULATORY_CARE_PROVIDER_SITE_OTHER): Payer: BC Managed Care – PPO

## 2014-05-24 VITALS — BP 100/72 | HR 70 | Temp 98.3°F | Ht 66.0 in | Wt 196.0 lb

## 2014-05-24 DIAGNOSIS — Z0000 Encounter for general adult medical examination without abnormal findings: Secondary | ICD-10-CM

## 2014-05-24 DIAGNOSIS — Z0001 Encounter for general adult medical examination with abnormal findings: Secondary | ICD-10-CM | POA: Insufficient documentation

## 2014-05-24 LAB — URINALYSIS, ROUTINE W REFLEX MICROSCOPIC
Bilirubin Urine: NEGATIVE
HGB URINE DIPSTICK: NEGATIVE
Ketones, ur: NEGATIVE
Leukocytes, UA: NEGATIVE
Nitrite: NEGATIVE
RBC / HPF: NONE SEEN (ref 0–?)
Specific Gravity, Urine: 1.025 (ref 1.000–1.030)
Total Protein, Urine: NEGATIVE
URINE GLUCOSE: NEGATIVE
UROBILINOGEN UA: 0.2 (ref 0.0–1.0)
pH: 6 (ref 5.0–8.0)

## 2014-05-24 LAB — CBC WITH DIFFERENTIAL/PLATELET
BASOS ABS: 0 10*3/uL (ref 0.0–0.1)
BASOS PCT: 0.4 % (ref 0.0–3.0)
EOS ABS: 0.1 10*3/uL (ref 0.0–0.7)
Eosinophils Relative: 1.6 % (ref 0.0–5.0)
HCT: 29.6 % — ABNORMAL LOW (ref 36.0–46.0)
Hemoglobin: 9.5 g/dL — ABNORMAL LOW (ref 12.0–15.0)
LYMPHS PCT: 35.4 % (ref 12.0–46.0)
Lymphs Abs: 1.9 10*3/uL (ref 0.7–4.0)
MCHC: 32.1 g/dL (ref 30.0–36.0)
MCV: 79.5 fl (ref 78.0–100.0)
MONO ABS: 0.5 10*3/uL (ref 0.1–1.0)
Monocytes Relative: 9.3 % (ref 3.0–12.0)
NEUTROS ABS: 2.9 10*3/uL (ref 1.4–7.7)
NEUTROS PCT: 53.3 % (ref 43.0–77.0)
Platelets: 217 10*3/uL (ref 150.0–400.0)
RBC: 3.72 Mil/uL — AB (ref 3.87–5.11)
RDW: 16 % — AB (ref 11.5–15.5)
WBC: 5.5 10*3/uL (ref 4.0–10.5)

## 2014-05-24 LAB — LIPID PANEL
CHOL/HDL RATIO: 2
Cholesterol: 174 mg/dL (ref 0–200)
HDL: 77 mg/dL (ref >39.00)
LDL Cholesterol: 84 mg/dL (ref 0–99)
NONHDL: 97
Triglycerides: 67 mg/dL (ref 0.0–149.0)
VLDL: 13.4 mg/dL (ref 0.0–40.0)

## 2014-05-24 LAB — BASIC METABOLIC PANEL
BUN: 19 mg/dL (ref 6–23)
CHLORIDE: 103 meq/L (ref 96–112)
CO2: 28 meq/L (ref 19–32)
Calcium: 9.7 mg/dL (ref 8.4–10.5)
Creatinine, Ser: 0.9 mg/dL (ref 0.4–1.2)
GFR: 86.66 mL/min (ref >60.00)
Glucose, Bld: 82 mg/dL (ref 70–99)
POTASSIUM: 4.1 meq/L (ref 3.5–5.1)
SODIUM: 135 meq/L (ref 135–145)

## 2014-05-24 LAB — HEPATIC FUNCTION PANEL
ALK PHOS: 27 U/L — AB (ref 39–117)
ALT: 14 U/L (ref 0–35)
AST: 23 U/L (ref 0–37)
Albumin: 3.5 g/dL (ref 3.5–5.2)
BILIRUBIN TOTAL: 0.2 mg/dL (ref 0.2–1.2)
Bilirubin, Direct: 0 mg/dL (ref 0.0–0.3)
TOTAL PROTEIN: 6.6 g/dL (ref 6.0–8.3)

## 2014-05-24 LAB — TSH: TSH: 1.09 u[IU]/mL (ref 0.35–4.50)

## 2014-05-24 NOTE — Patient Instructions (Addendum)
Please continue all other medications as before, and refills have been done if requested.  Please have the pharmacy call with any other refills you may need.  Please continue your efforts at being more active, low cholesterol diet, and weight control.  You are otherwise up to date with prevention measures today.  Please keep your appointments with your specialists as you may have planned  You will be contacted regarding the referral for: mammogram  Please remember to see your GYN for yearly pap smear  Please go to the LAB in the Basement (turn left off the elevator) for the tests to be done today  You will be contacted by phone if any changes need to be made immediately.  Otherwise, you will receive a letter about your results with an explanation, but please check with MyChart first.  Please remember to sign up for MyChart if you have not done so, as this will be important to you in the future with finding out test results, communicating by private email, and scheduling acute appointments online when needed.  Please return in 1 year for your yearly visit, or sooner if needed, with Lab testing done 3-5 days before

## 2014-05-24 NOTE — Progress Notes (Signed)
Subjective:    Patient ID: Katherine Tucker, female    DOB: Mar 15, 1971, 43 y.o.   MRN: [id number]  HPIn Here for wellness and f/u;  Overall doing ok;  Pt denies CP, worsening SOB, DOE, wheezing, orthopnea, PND, worsening LE edema, palpitations, dizziness or syncope.  Pt denies neurological change such as new headache, facial or extremity weakness.  Pt denies polydipsia, polyuria, or low sugar symptoms. Pt states overall good compliance with treatment and medications, good tolerability, and has been trying to follow lower cholesterol diet.  Pt denies worsening depressive symptoms, suicidal ideation or panic. No fever, night sweats, wt loss, loss of appetite, or other constitutional symptoms.  Pt states good ability with ADL's, has low fall risk, home safety reviewed and adequate, no other significant changes in hearing or vision, and only occasionally active with exercise.  S/p gastric lap band, lost over 100 lbs, then band loosened than gained some back; no other current complaints. Due for pap/mammogram Past Medical History  Diagnosis Date  . Chicken pox   . Anxiety   . Depression   . Headache(784.0)   . Hyperlipidemia   . Hypertension   . Morbid obesity   . Elevated prolactin level     elevated in 2009. MRI brain normal  . Mammary duct ectasia of right breast 2009    excision of duct - Dr. Davina [facility]okeornet 2010 - nonmalignant path   Past Surgical History  Procedure Laterality Date  . Bladder surgery      childhood  . Lapband procedure  5-11    [facility]  . Breast lesion excison Right Jan 2010    Dr. Luisa [facility]ett. Ductal ectasia    reports that she has never smoked. She has never used smokeless tobacco. She reports that she does not drink alcohol or use illicit drugs. family history includes Arthritis in her mother; Cancer in her paternal aunt; Diabetes in her other; Hyperlipidemia in her father and mother; Hypertension in her father and mother. No Known Allergies Current Outpatient Prescriptions  on File Prior to Visit  Medication Sig Dispense Refill  . Calcium 1500 MG tablet Take 1,500 mg by mouth daily.        . citalopram (CELEXA) 40 MG tablet Take 1 tablet (40 mg total) by mouth daily.  30 tablet  10  . citalopram (CELEXA) 40 MG tablet TAKE 1 TABLET EVERY DAY  30 tablet  10  . COD LIVER OIL PO Take by mouth daily.        Marland Kitchen. ENPRESSE-28 tablet TAKE 1 TABLET BY MOUTH DAILY.  28 tablet  11  . fluconazole (DIFLUCAN) 150 MG tablet Take 1 tablet (150 mg total) by mouth once.  1 tablet  1  . multivitamin (THERAGRAN) tablet Take 1 tablet by mouth daily. Centrum [place].      . nebivolol (BYSTOLIC) 5 MG tablet Take 1 tablet (5 mg total) by mouth daily.  90 tablet  1  . ranitidine (ZANTAC) 150 MG tablet Take 1 tablet (150 mg total) by mouth 2 (two) times daily.  60 tablet  11   No current facility-administered medications on file prior to visit.    Review of Systems Constitutional: Negative for increased diaphoresis, other activity, appetite or other siginficant weight change  HENT: Negative for worsening hearing loss, ear pain, facial swelling, mouth sores and neck stiffness.   Eyes: Negative for other worsening pain, redness or visual disturbance.  Respiratory: Negative for shortness of breath and wheezing.   Cardiovascular: Negative for chest  pain and palpitations.  Gastrointestinal: Negative for diarrhea, blood in stool, abdominal distention or other pain Genitourinary: Negative for hematuria, flank pain or change in urine volume.  Musculoskeletal: Negative for myalgias or other joint complaints.  Skin: Negative for color change and wound.  Neurological: Negative for syncope and numbness. other than noted Hematological: Negative for adenopathy. or other swelling Psychiatric/Behavioral: Negative for hallucinations, self-injury, decreased concentration or other worsening agitation.      Objective:   Physical Exam BP 100/72  Pulse 70  Temp(Src) 98.3 F (36.8 C) (Oral)  Ht 5\' 6"   (1.676 m)  Wt 196 lb (88.905 kg)  BMI 31.65 kg/m2  SpO2 99% VS noted,  Constitutional: Pt is oriented to person, place, and time. Appears well-developed and well-nourished.  Head: Normocephalic and atraumatic.  Right Ear: External ear normal.  Left Ear: External ear normal.  Nose: Nose normal.  Mouth/Throat: Oropharynx is clear and moist.  Eyes: Conjunctivae and EOM are normal. Pupils are equal, round, and reactive to light.  Neck: Normal range of motion. Neck supple. No JVD present. No tracheal deviation present.  Cardiovascular: Normal rate, regular rhythm, normal heart sounds and intact distal pulses.   Pulmonary/Chest: Effort normal and breath sounds without rales or wheezing  Abdominal: Soft. Bowel sounds are normal. NT. No HSM  Musculoskeletal: Normal range of motion. Exhibits no edema.  Lymphadenopathy:  Has no cervical adenopathy.  Neurological: Pt is alert and oriented to person, place, and time. Pt has normal reflexes. No cranial nerve deficit. Motor grossly intact Skin: Skin is warm and dry. No rash noted.  Psychiatric:  Has normal mood and affect. Behavior is normal.      Assessment & Plan:

## 2014-05-24 NOTE — Assessment & Plan Note (Signed)

## 2014-05-24 NOTE — Progress Notes (Signed)
Pre visit review using our clinic review tool, if applicable. No additional management support is needed unless otherwise documented below in the visit note. 

## 2014-05-25 ENCOUNTER — Ambulatory Visit: Payer: BC Managed Care – PPO

## 2014-05-25 DIAGNOSIS — D649 Anemia, unspecified: Secondary | ICD-10-CM

## 2014-05-25 LAB — VITAMIN B12: VITAMIN B 12: 316 pg/mL (ref 211–911)

## 2014-05-25 LAB — IBC PANEL
Iron: 31 ug/dL — ABNORMAL LOW (ref 42–145)
Saturation Ratios: 5.9 % — ABNORMAL LOW (ref 20.0–50.0)
Transferrin: 376.8 mg/dL — ABNORMAL HIGH (ref 212.0–360.0)

## 2014-05-29 ENCOUNTER — Telehealth: Payer: Self-pay

## 2014-05-29 ENCOUNTER — Inpatient Hospital Stay: Admission: RE | Admit: 2014-05-29 | Payer: BC Managed Care – PPO | Source: Ambulatory Visit

## 2014-05-29 DIAGNOSIS — G4733 Obstructive sleep apnea (adult) (pediatric): Secondary | ICD-10-CM

## 2014-05-29 NOTE — Telephone Encounter (Signed)
Referral done

## 2014-05-29 NOTE — Telephone Encounter (Signed)
Patient informed of referral

## 2014-05-29 NOTE — Telephone Encounter (Signed)
The patient states she had a sleep study about 10 yrs ago ordered by Dr. Debby BudNorins.  She does have sleep apnea and has a CPAP, but has not used it for 5 years.  She has lost a lot of Weight and thinks maybe she needs a referral to [facility] to redo her sleep study.

## 2014-05-31 ENCOUNTER — Other Ambulatory Visit: Payer: Self-pay | Admitting: Internal Medicine

## 2014-05-31 ENCOUNTER — Ambulatory Visit
Admission: RE | Admit: 2014-05-31 | Discharge: 2014-05-31 | Disposition: A | Discharge status: Home / Self Care | Payer: BC Managed Care – PPO | Source: Ambulatory Visit | Attending: Internal Medicine | Admitting: Internal Medicine

## 2014-05-31 DIAGNOSIS — Z1231 Encounter for screening mammogram for malignant neoplasm of breast: Secondary | ICD-10-CM

## 2014-05-31 DIAGNOSIS — Z0000 Encounter for general adult medical examination without abnormal findings: Secondary | ICD-10-CM

## 2014-06-06 ENCOUNTER — Other Ambulatory Visit: Payer: Self-pay

## 2014-06-06 MED ORDER — RANITIDINE HCL 150 MG PO TABS: 150.0000 mg | ORAL_TABLET | Freq: Two times a day (BID) | ORAL | Status: DC

## 2014-06-09 ENCOUNTER — Ambulatory Visit (INDEPENDENT_AMBULATORY_CARE_PROVIDER_SITE_OTHER): Payer: BC Managed Care – PPO | Admitting: Family Medicine

## 2014-06-09 VITALS — BP 102/70 | HR 73 | Temp 98.9°F | Resp 16 | Ht 65.5 in | Wt 193.0 lb

## 2014-06-09 DIAGNOSIS — J029 Acute pharyngitis, unspecified: Secondary | ICD-10-CM

## 2014-06-09 LAB — POCT RAPID STREP A (OFFICE): RAPID STREP A SCREEN: NEGATIVE

## 2014-06-09 MED ORDER — MAGIC MOUTHWASH W/LIDOCAINE: 10.0000 mL | ORAL | Status: DC | PRN

## 2014-06-09 NOTE — Progress Notes (Signed)
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 She started getting sick on Thursday. Felt terrible. She developed a bad sore throat. She has a hard time swallowing. She has had generalized malaise. No fever. No specific ear pains. Not much rhinorrhea or cough. She does not smoke. She is on a number of medications.  Objective: TMs are normal. Throat minimally erythematous. Neck supple without significant nodes. She is tender in her neck however.. Chest is clear to auscultation. Heart regular without murmurs.  Assessment:  Sore throat  Plan: Strep test and throat culture if needed.  Results for orders placed in visit on 06/09/14  POCT RAPID STREP A (OFFICE)      Result Value Ref Range   Rapid Strep A Screen Negative  Negative   Throat culture will be sent.  Treat symptomatically.  Prescribed Magic mouthwash  Return if needed

## 2014-06-09 NOTE — Patient Instructions (Signed)
Use magic mouthwash as directed by pharmacist.  Viral Pharyngitis Viral pharyngitis is a viral infection that produces redness, pain, and swelling (inflammation) of the throat. It can spread from person to person (contagious). CAUSES Viral pharyngitis is caused by inhaling a large amount of certain germs called viruses. Many different viruses cause viral pharyngitis. SYMPTOMS Symptoms of viral pharyngitis include:  Sore throat.  Tiredness.  Stuffy nose.  Low-grade fever.  Congestion.  Cough. TREATMENT Treatment includes rest, drinking plenty of fluids, and the use of over-the-counter medication (approved by your caregiver). HOME CARE INSTRUCTIONS   Drink enough fluids to keep your urine clear or pale yellow.  Eat soft, cold foods such as ice cream, frozen ice pops, or gelatin dessert.  Gargle with warm salt water (1 tsp salt per 1 qt of water).  If over age 767, throat lozenges may be used safely.  Only take over-the-counter or prescription medicines for pain, discomfort, or fever as directed by your caregiver. Do not take aspirin. To help prevent spreading viral pharyngitis to others, avoid:  Mouth-to-mouth contact with others.  Sharing utensils for eating and drinking.  Coughing around others. SEEK MEDICAL CARE IF:   You are better in a few days, then become worse.  You have a fever or pain not helped by pain medicines.  There are any other changes that concern you. Document Released: 09/02/2005 Document Revised: 02/15/2012 Document Reviewed: 01/29/2011 [facility] Patient Information 2015 [employer] [place], [place]. This information is not intended to replace advice given to you by your health care provider. Make sure you discuss any questions you have with your health care provider.

## 2014-06-11 LAB — CULTURE, GROUP A STREP: Organism ID, Bacteria: NORMAL

## 2014-06-12 ENCOUNTER — Emergency Department (HOSPITAL_COMMUNITY)
Admission: EM | Admit: 2014-06-12 | Discharge: 2014-06-12 | Disposition: A | Discharge status: Home / Self Care | Payer: BC Managed Care – PPO | Source: Home / Self Care | Attending: Family Medicine | Admitting: Family Medicine

## 2014-06-12 ENCOUNTER — Encounter (HOSPITAL_COMMUNITY): Payer: Self-pay | Admitting: Emergency Medicine

## 2014-06-12 DIAGNOSIS — B9689 Other specified bacterial agents as the cause of diseases classified elsewhere: Secondary | ICD-10-CM

## 2014-06-12 DIAGNOSIS — H1089 Other conjunctivitis: Secondary | ICD-10-CM

## 2014-06-12 DIAGNOSIS — H109 Unspecified conjunctivitis: Secondary | ICD-10-CM

## 2014-06-12 DIAGNOSIS — A499 Bacterial infection, unspecified: Secondary | ICD-10-CM

## 2014-06-12 DIAGNOSIS — J329 Chronic sinusitis, unspecified: Secondary | ICD-10-CM

## 2014-06-12 MED ORDER — MINOCYCLINE HCL 100 MG PO CAPS: 100.0000 mg | ORAL_CAPSULE | Freq: Two times a day (BID) | ORAL | Status: DC

## 2014-06-12 MED ORDER — MOXIFLOXACIN HCL 0.5 % OP SOLN
1.0000 [drp] | Freq: Three times a day (TID) | OPHTHALMIC | Status: DC
Start: 2014-06-12 — End: 2014-07-27

## 2014-06-12 NOTE — ED Provider Notes (Signed)
CSN: [id number]     Arrival date & time 06/12/14  [id number] History   First MD Initiated Contact with Patient 06/12/14 [id number]     Chief Complaint  Patient presents with  . Conjunctivitis   (Consider location/radiation/quality/duration/timing/severity/associated sxs/prior Treatment) Patient is a 43 y.o. female presenting with conjunctivitis. The history is provided by the patient.  Conjunctivitis This is a new problem. The current episode started 3 to 5 hours ago. The problem has been gradually worsening. Pertinent negatives include no chest pain, no abdominal pain, no headaches and no shortness of breath. Associated symptoms comments: Sinus cong, seen at [facility] and told uri.- would run it's course.Marland Kitchen.    Past Medical History  Diagnosis Date  . Chicken pox   . Anxiety   . Depression   . Headache(784.0)   . Hyperlipidemia   . Hypertension   . Morbid obesity   . Elevated prolactin level     elevated in 2009. MRI brain normal  . Mammary duct ectasia of right breast 2009    excision of duct - Dr. Davina Pokeornet 2010 - nonmalignant path   Past Surgical History  Procedure Laterality Date  . Bladder surgery      childhood  . Lapband procedure  5-11    [facility]  . Breast lesion excison Right Jan 2010    Dr. Luisa Hartornett. Ductal ectasia   Family History  Problem Relation Age of Onset  . Arthritis Mother   . Hypertension Mother   . Hyperlipidemia Mother   . Hypertension Father   . Hyperlipidemia Father   . Cancer Paternal Aunt     breast  . Diabetes Other    History  Substance Use Topics  . Smoking status: Never Smoker   . Smokeless tobacco: Never Used  . Alcohol Use: No   OB History   Grav Para Term Preterm Abortions TAB SAB Ect Mult Living                 Review of Systems  Constitutional: Negative.   HENT: Positive for congestion, postnasal drip and rhinorrhea.   Eyes: Positive for discharge and redness.  Respiratory: Negative.  Negative for shortness of breath.   Cardiovascular:  Negative.  Negative for chest pain.  Gastrointestinal: Negative for abdominal pain.  Neurological: Negative for headaches.    Allergies  Review of patient's allergies indicates no known allergies.  Home Medications   Prior to Admission medications   Medication Sig Start Date End Date Taking? Authorizing Provider  Alum & Mag Hydroxide-Simeth (MAGIC MOUTHWASH W/LIDOCAINE) SOLN Take 10 mLs by mouth every 2 (two) hours as needed for mouth pain. 06/09/14   Peyton Najjaravid H Hopper, MD  Calcium 1500 MG tablet Take 1,500 mg by mouth daily.      Historical Provider, MD  citalopram (CELEXA) 40 MG tablet Take 1 tablet (40 mg total) by mouth daily. 01/26/12   Jacques NavyMichael E Norins, MD  COD LIVER OIL PO Take by mouth daily.      Historical Provider, MD  ENPRESSE-28 tablet TAKE 1 TABLET BY MOUTH DAILY. 01/17/14   Jacques NavyMichael E Norins, MD  fluconazole (DIFLUCAN) 150 MG tablet Take 1 tablet (150 mg total) by mouth once. 12/05/13   Jeoffrey MassedPhilip H McGowen, MD  minocycline (MINOCIN,DYNACIN) 100 MG capsule Take 1 capsule (100 mg total) by mouth 2 (two) times daily. 06/12/14   Linna HoffJames D Kindl, MD  moxifloxacin (VIGAMOX) 0.5 % ophthalmic solution Place 1 drop into the right eye 3 (three) times daily. 06/12/14   Fayrene FearingJames  Sallyanne Kuster Kindl, MD  multivitamin [facility](THERAGRAN) tablet Take 1 tablet by mouth daily. [facility].    Historical Provider, MD  nebivolol (BYSTOLIC) 5 MG tablet Take 1 tablet (5 mg total) by mouth daily. 03/28/14   Corwin LevinsJames W John, MD  ranitidine (ZANTAC) 150 MG tablet Take 1 tablet (150 mg total) by mouth 2 (two) times daily. 06/06/14   Corwin LevinsJames W John, MD   BP 135/90  Pulse 68  Temp(Src) 98.4 F (36.9 C) (Oral)  Resp 16  Ht 5' 5.5" (1.664 m)  Wt 193 lb (87.544 kg)  BMI 31.62 kg/m2  SpO2 100%  LMP 06/10/2014 Physical Exam  Nursing note and vitals reviewed. Constitutional: She is oriented to person, place, and time. She appears well-developed and well-nourished.  HENT:  Head: Normocephalic.  Right Ear: External ear normal.  Left Ear:  External ear normal.  Nose: Mucosal edema and rhinorrhea present.  Mouth/Throat: Oropharynx is clear and moist.  Eyes: EOM are normal. Pupils are equal, round, and reactive to light. Right eye exhibits discharge. Left eye exhibits no discharge. Right conjunctiva is injected. Right conjunctiva has no hemorrhage. Left conjunctiva is not injected.  Neck: Normal range of motion. Neck supple.  Lymphadenopathy:    She has no cervical adenopathy.  Neurological: She is alert and oriented to person, place, and time.  Skin: Skin is warm and dry.    ED Course  Procedures (including critical care time) Labs Review Labs Reviewed - No data to display  Imaging Review No results found.   MDM   1. Sinusitis, bacterial   2. Conjunctivitis, bacterial        Linna HoffJames D Kindl, MD 06/12/14 [phone]

## 2014-06-12 NOTE — Discharge Instructions (Signed)
Warm cloth soak to eye before drop applied, take all of antibiotic, drink plenty of fluids, see your doctor if further problems.

## 2014-06-12 NOTE — ED Notes (Signed)
C/o right pink eye which started last night States she has had cold sx since Thursday States she has yellowish mucous drainage coming from eye this morning Did use warm compresses

## 2014-06-25 ENCOUNTER — Telehealth: Payer: Self-pay | Admitting: Internal Medicine

## 2014-06-25 MED ORDER — FLUCONAZOLE 150 MG PO TABS: 150.0000 mg | ORAL_TABLET | Freq: Once | ORAL | Status: DC

## 2014-06-25 NOTE — Telephone Encounter (Signed)
Patient called requesting a refill on her diflucan. She uses [facility] [address]. Pt advised that in the future she needs to call her pharmacy for medication requests.

## 2014-07-03 ENCOUNTER — Other Ambulatory Visit: Payer: Self-pay

## 2014-07-03 MED ORDER — CITALOPRAM HYDROBROMIDE 40 MG PO TABS: 40.0000 mg | ORAL_TABLET | Freq: Every day | ORAL | Status: DC

## 2014-07-27 ENCOUNTER — Encounter: Payer: Self-pay | Admitting: Pulmonary Disease

## 2014-07-27 ENCOUNTER — Ambulatory Visit (INDEPENDENT_AMBULATORY_CARE_PROVIDER_SITE_OTHER): Payer: BC Managed Care – PPO | Admitting: Pulmonary Disease

## 2014-07-27 VITALS — BP 132/78 | HR 73 | Temp 98.1°F | Ht 65.5 in | Wt 193.8 lb

## 2014-07-27 DIAGNOSIS — G4733 Obstructive sleep apnea (adult) (pediatric): Secondary | ICD-10-CM | POA: Insufficient documentation

## 2014-07-27 NOTE — Patient Instructions (Signed)
Will schedule for home sleep testing, and will call with results. Work on further weight loss

## 2014-07-27 NOTE — Progress Notes (Signed)
   Subjective:    Patient ID: Katherine Tucker, female    DOB: Oct 16, 1971, 43 y.o.   MRN: [id number]  HPI The patient is a 43 year old female who I've been asked to see for reevaluation of obstructive sleep apnea. She was diagnosed in 2006 with sleep apnea, but her records and sleep study are not available. She was started on CPAP which she is successfully until 2011, when she had bariatric surgery. She had lost over 100 pounds, and felt the pressure was too much, and therefore discontinued the device. Overall, she has slept fairly well without CPAP, but she admits that she still snores and has occasional choking arousal. However, she is unsure if this is related to reflux after her bariatric surgery. No one has ever mentioned an abnormal breathing pattern during sleep, but she sleeps alone. She only awakens 2 times a night, but is unrested 40% of the mornings. She does have some sleep pressure during the day with inactivity while at work, she will often drink coffee to get around this. She does well in the evening trying to watch television or movies. She denies any sleepiness issues with driving. The patient's Epworth score is 7.   Review of Systems  Constitutional: Negative for fever and unexpected weight change.  HENT: Negative for congestion, dental problem, ear pain, nosebleeds, postnasal drip, rhinorrhea, sinus pressure, sneezing, sore throat and trouble swallowing.   Eyes: Negative for redness and itching.  Respiratory: Negative for cough, chest tightness, shortness of breath and wheezing.   Cardiovascular: Negative for palpitations and leg swelling.  Gastrointestinal: Negative for nausea and vomiting.  Genitourinary: Negative for dysuria.  Musculoskeletal: Negative for joint swelling.  Skin: Negative for rash.  Neurological: Negative for headaches.  Hematological: Does not bruise/bleed easily.  Psychiatric/Behavioral: Negative for dysphoric mood. The patient is not nervous/anxious.         Objective:   Physical Exam Constitutional:  Overweight female, no acute distress  HENT:  Nares patent without discharge  Oropharynx without exudate, palate and uvula are normal  Eyes:  Perrla, eomi, no scleral icterus  Neck:  No JVD, no TMG  Cardiovascular:  Normal rate, regular rhythm, no rubs or gallops.  1/6 sem        Intact distal pulses  Pulmonary :  Normal breath sounds, no stridor or respiratory distress   No rales, rhonchi, or wheezing  Abdominal:  Soft, nondistended, bowel sounds present.  No tenderness noted.   Musculoskeletal:  No lower extremity edema noted.  Lymph Nodes:  No cervical lymphadenopathy noted  Skin:  No cyanosis noted  Neurologic:  Alert, appropriate, moves all 4 extremities without obvious deficit.         Assessment & Plan:

## 2014-07-27 NOTE — Assessment & Plan Note (Signed)
The patient has a history of obstructive sleep apnea which was treated successfully with CPAP. She now has had bariatric surgery with a significant weight loss, and quit using CPAP because of pressure and tolerance. Overall, she has done well without the device, but she does have some symptoms suggestive of mild disease. However, she allows two dogs to sleep with her at night, and admits it greatly disrupts her sleep. At this point, she needs to have a sleep study done to reevaluate her degree of sleep apnea, or to see if this has resolved.  She is agreeable to this approach, and will schedule her for home sleep testing. If she does still have sleep apnea, we'll need to consider whether to start CPAP again or consider a dental appliance.

## 2014-09-13 DIAGNOSIS — G4733 Obstructive sleep apnea (adult) (pediatric): Secondary | ICD-10-CM

## 2014-09-14 DIAGNOSIS — G4733 Obstructive sleep apnea (adult) (pediatric): Secondary | ICD-10-CM

## 2014-09-17 ENCOUNTER — Encounter: Payer: Self-pay | Admitting: Pulmonary Disease

## 2014-09-17 ENCOUNTER — Telehealth: Payer: Self-pay | Admitting: Pulmonary Disease

## 2014-09-17 NOTE — Telephone Encounter (Signed)
lmomtcb x1 for pt 

## 2014-09-17 NOTE — Telephone Encounter (Signed)
Needs ov to review sleep study 

## 2014-09-18 NOTE — Telephone Encounter (Signed)
Called pt. [facility] is [facility] Nothing further needed

## 2014-09-24 ENCOUNTER — Encounter: Payer: Self-pay | Admitting: Pulmonary Disease

## 2014-09-24 ENCOUNTER — Ambulatory Visit (INDEPENDENT_AMBULATORY_CARE_PROVIDER_SITE_OTHER): Payer: BC Managed Care – PPO | Admitting: Pulmonary Disease

## 2014-09-24 VITALS — BP 122/74 | HR 69 | Temp 97.0°F | Ht 65.5 in | Wt 197.2 lb

## 2014-09-24 DIAGNOSIS — G4733 Obstructive sleep apnea (adult) (pediatric): Secondary | ICD-10-CM

## 2014-09-24 NOTE — Assessment & Plan Note (Signed)
The patient has mild to moderate obstructive sleep apnea by her recent home study. I have discussed with her various treatment options, and outlined an aggressive versus a conservative approach. Her degree of sleep apnea does not represent a significant health risk for her, and therefore she could consider a trial of weight loss alone. If she wanted to treat this more aggressively, she could consider either CPAP or a dental appliance while working on weight loss. After a long discussion, the patient would like to try CPAP again, and will consider a dental appliance in the future.

## 2014-09-24 NOTE — Progress Notes (Signed)
   Subjective:    Patient ID: Katherine Tucker, female    DOB: [birth year], 43 y.o.   MRN: [id number]  HPI The patient comes in today for followup of her recent home sleep test.  She was found to have mild to moderate OSA, with an AHI of 18 events per hour. I have reviewed the study with her in detail, and answered all of her questions.   Review of Systems  Constitutional: Negative for fever and unexpected weight change.  HENT: Negative for congestion, dental problem, ear pain, nosebleeds, postnasal drip, rhinorrhea, sinus pressure, sneezing, sore throat and trouble swallowing.   Eyes: Negative for redness and itching.  Respiratory: Negative for cough, chest tightness, shortness of breath and wheezing.   Cardiovascular: Negative for palpitations and leg swelling.  Gastrointestinal: Negative for nausea and vomiting.  Genitourinary: Negative for dysuria.  Musculoskeletal: Negative for joint swelling.  Skin: Negative for rash.  Neurological: Negative for headaches.  Hematological: Does not bruise/bleed easily.  Psychiatric/Behavioral: Negative for dysphoric mood. The patient is not nervous/anxious.        Objective:   Physical Exam Wd female in nad  Nose without purulence or discharge noted Neck without lymphadenopathy or thyromegaly Lower extremities without significant edema, no cyanosis Alert and oriented, moves all 4 extremities.       Assessment & Plan:

## 2014-09-24 NOTE — Patient Instructions (Signed)
Will start on cpap with the auto setting.  Please call if having issues with tolerance. Work on weight reduction as able. followup with me again in 89 weeks.

## 2014-09-26 ENCOUNTER — Other Ambulatory Visit: Payer: Self-pay

## 2014-09-26 DIAGNOSIS — I10 Essential (primary) hypertension: Secondary | ICD-10-CM

## 2014-09-26 MED ORDER — NEBIVOLOL HCL 5 MG PO TABS: 5.0000 mg | ORAL_TABLET | Freq: Every day | ORAL | Status: DC

## 2014-10-26 ENCOUNTER — Encounter: Payer: Self-pay | Admitting: Internal Medicine

## 2014-10-26 ENCOUNTER — Encounter: Payer: Self-pay | Admitting: Pulmonary Disease

## 2014-11-14 ENCOUNTER — Encounter: Payer: Self-pay | Admitting: Pulmonary Disease

## 2014-12-03 ENCOUNTER — Ambulatory Visit: Payer: BC Managed Care – PPO | Admitting: Pulmonary Disease

## 2014-12-10 ENCOUNTER — Other Ambulatory Visit: Payer: Self-pay | Admitting: Internal Medicine

## 2014-12-11 ENCOUNTER — Telehealth: Payer: Self-pay | Admitting: Pulmonary Disease

## 2014-12-11 NOTE — Telephone Encounter (Signed)
Pt was calling to get [facility]'s number so that she could call and get her CPAP set up. She was waiting until after the new year to have this set up. Nothing further was needed.

## 2015-05-02 ENCOUNTER — Telehealth: Payer: Self-pay | Admitting: Internal Medicine

## 2015-05-02 MED ORDER — METOPROLOL TARTRATE 25 MG PO TABS: 25.0000 mg | ORAL_TABLET | Freq: Two times a day (BID) | ORAL | Status: DC

## 2015-05-02 NOTE — Telephone Encounter (Signed)
Alternative is Metoprolol 25 mg bid #60

## 2015-05-02 NOTE — Telephone Encounter (Signed)
Left message on machine for pt to return my call, Rx sent to pharmacy

## 2015-05-02 NOTE — Telephone Encounter (Signed)
[facility] [address]  BYSTOLIC 5 MG tablet [[id number]][[id number]] - patient ios requesting an alternative that has a generic version. She states that there is no generic version for this specific medication yet .

## 2015-05-02 NOTE — Telephone Encounter (Signed)
Dr. Alfonse FlavorsHopp, would you please advise in PCP's absence? Thanks

## 2015-05-03 NOTE — Telephone Encounter (Signed)
Left message on machine for pt to return my call, closing phone note until further contact

## 2015-05-23 ENCOUNTER — Ambulatory Visit (INDEPENDENT_AMBULATORY_CARE_PROVIDER_SITE_OTHER): Payer: BC Managed Care – PPO | Admitting: Internal Medicine

## 2015-05-23 ENCOUNTER — Encounter: Payer: Self-pay | Admitting: Internal Medicine

## 2015-05-23 ENCOUNTER — Other Ambulatory Visit (INDEPENDENT_AMBULATORY_CARE_PROVIDER_SITE_OTHER): Payer: BC Managed Care – PPO

## 2015-05-23 VITALS — BP 110/68 | HR 73 | Temp 98.5°F | Ht 66.0 in | Wt 192.0 lb

## 2015-05-23 DIAGNOSIS — Z0000 Encounter for general adult medical examination without abnormal findings: Secondary | ICD-10-CM

## 2015-05-23 LAB — CBC WITH DIFFERENTIAL/PLATELET
BASOS PCT: 0.7 % (ref 0.0–3.0)
Basophils Absolute: 0 10*3/uL (ref 0.0–0.1)
EOS PCT: 2.9 % (ref 0.0–5.0)
Eosinophils Absolute: 0.1 10*3/uL (ref 0.0–0.7)
HEMATOCRIT: 26.6 % — AB (ref 36.0–46.0)
Hemoglobin: 8.4 g/dL — ABNORMAL LOW (ref 12.0–15.0)
LYMPHS ABS: 1.7 10*3/uL (ref 0.7–4.0)
Lymphocytes Relative: 47.3 % — ABNORMAL HIGH (ref 12.0–46.0)
MCHC: 31.3 g/dL (ref 30.0–36.0)
MCV: 71.1 fl — ABNORMAL LOW (ref 78.0–100.0)
MONO ABS: 0.5 10*3/uL (ref 0.1–1.0)
Monocytes Relative: 12.6 % — ABNORMAL HIGH (ref 3.0–12.0)
NEUTROS ABS: 1.3 10*3/uL — AB (ref 1.4–7.7)
Neutrophils Relative %: 36.5 % — ABNORMAL LOW (ref 43.0–77.0)
Platelets: 291 10*3/uL (ref 150.0–400.0)
RBC: 3.74 Mil/uL — ABNORMAL LOW (ref 3.87–5.11)
RDW: 19 % — ABNORMAL HIGH (ref 11.5–15.5)
WBC: 3.6 10*3/uL — AB (ref 4.0–10.5)

## 2015-05-23 LAB — BASIC METABOLIC PANEL
BUN: 15 mg/dL (ref 6–23)
CHLORIDE: 104 meq/L (ref 96–112)
CO2: 30 meq/L (ref 19–32)
Calcium: 9.8 mg/dL (ref 8.4–10.5)
Creatinine, Ser: 0.96 mg/dL (ref 0.40–1.20)
GFR: 82.11 mL/min (ref >60.00)
Glucose, Bld: 93 mg/dL (ref 70–99)
Potassium: 4 mEq/L (ref 3.5–5.1)
SODIUM: 136 meq/L (ref 135–145)

## 2015-05-23 LAB — LIPID PANEL
CHOLESTEROL: 183 mg/dL (ref 0–200)
HDL: 76.5 mg/dL (ref >39.00)
LDL CALC: 95 mg/dL (ref 0–99)
NonHDL: 106.5
Total CHOL/HDL Ratio: 2
Triglycerides: 56 mg/dL (ref 0.0–149.0)
VLDL: 11.2 mg/dL (ref 0.0–40.0)

## 2015-05-23 LAB — HEPATIC FUNCTION PANEL
ALK PHOS: 32 U/L — AB (ref 39–117)
ALT: 12 U/L (ref 0–35)
AST: 22 U/L (ref 0–37)
Albumin: 4 g/dL (ref 3.5–5.2)
BILIRUBIN DIRECT: 0.1 mg/dL (ref 0.0–0.3)
BILIRUBIN TOTAL: 0.3 mg/dL (ref 0.2–1.2)
Total Protein: 6.8 g/dL (ref 6.0–8.3)

## 2015-05-23 LAB — TSH: TSH: 0.92 u[IU]/mL (ref 0.35–4.50)

## 2015-05-23 MED ORDER — RANITIDINE HCL 150 MG PO TABS: 150.0000 mg | ORAL_TABLET | Freq: Two times a day (BID) | ORAL | Status: DC

## 2015-05-23 MED ORDER — CITALOPRAM HYDROBROMIDE 40 MG PO TABS: 40.0000 mg | ORAL_TABLET | Freq: Every day | ORAL | Status: DC

## 2015-05-23 MED ORDER — METOPROLOL TARTRATE 25 MG PO TABS: 25.0000 mg | ORAL_TABLET | Freq: Two times a day (BID) | ORAL | Status: DC

## 2015-05-23 NOTE — Progress Notes (Signed)
Subjective:    Patient ID: Katherine Tucker, female    DOB: 1971/11/17, 44 y.o.   MRN: [id number]  HPI  Here for wellness and f/u;  Overall doing ok;  Pt denies Chest pain, worsening SOB, DOE, wheezing, orthopnea, PND, worsening LE edema, palpitations, dizziness or syncope.  Pt denies neurological change such as new headache, facial or extremity weakness.  Pt denies polydipsia, polyuria, or low sugar symptoms. Pt states overall good compliance with treatment and medications, good tolerability, and has been trying to follow appropriate diet.  Pt denies worsening depressive symptoms, suicidal ideation or panic. No fever, night sweats, wt loss, loss of appetite, or other constitutional symptoms.  Pt states good ability with ADL's, has low fall risk, home safety reviewed and adequate, no other significant changes in hearing or vision, and active with exercise with gym 4 times per wk.    Wt Readings from Last 3 Encounters:  05/23/15 192 lb (87.091 kg)  09/24/14 197 lb 3.2 oz (89.449 kg)  07/27/14 193 lb 12.8 oz (87.907 kg)   Past Medical History  Diagnosis Date  . Chicken pox   . Anxiety   . Depression   . Headache(784.0)   . Hyperlipidemia   . Hypertension   . Morbid obesity   . Elevated prolactin level     elevated in 2009. MRI brain normal  . Mammary duct ectasia of right breast 2009    excision of duct - Dr. Davina Poke 2010 - nonmalignant path   Past Surgical History  Procedure Laterality Date  . Bladder surgery      childhood  . Lapband procedure  5-11    [facility]  . Breast lesion excison Right Jan 2010    Dr. Luisa Hart. Ductal ectasia    reports that she has never smoked. She has never used smokeless tobacco. She reports that she does not drink alcohol or use illicit drugs. family history includes Arthritis in her mother; Cancer in her paternal aunt; Diabetes in her other; Hyperlipidemia in her father and mother; Hypertension in her father and mother. Allergies  Allergen Reactions    . Contrast Media [Iodinated Diagnostic Agents]      Review of Systems Constitutional: Negative for increased diaphoresis, other activity, appetite or siginficant weight change other than noted HENT: Negative for worsening hearing loss, ear pain, facial swelling, mouth sores and neck stiffness.   Eyes: Negative for other worsening pain, redness or visual disturbance.  Respiratory: Negative for shortness of breath and wheezing  Cardiovascular: Negative for chest pain and palpitations.  Gastrointestinal: Negative for diarrhea, blood in stool, abdominal distention or other pain Genitourinary: Negative for hematuria, flank pain or change in urine volume.  Musculoskeletal: Negative for myalgias or other joint complaints.  Skin: Negative for color change and wound or drainage.  Neurological: Negative for syncope and numbness. other than noted Hematological: Negative for adenopathy. or other swelling Psychiatric/Behavioral: Negative for hallucinations, SI, self-injury, decreased concentration or other worsening agitation.      Objective:   Physical Exam BP 110/68 mmHg  Pulse 73  Temp(Src) 98.5 F (36.9 C) (Oral)  Ht  (1.676 m)  Wt 192 lb (87.091 kg)  BMI 31.00 kg/m2  SpO2 97%  LMP 05/16/2015 VS noted,  Constitutional: Pt is oriented to person, place, and time. Appears well-developed and well-nourished, in no significant distress Head: Normocephalic and atraumatic.  Right Ear: External ear normal.  Left Ear: External ear normal.  Nose: Nose normal.  Mouth/Throat: Oropharynx is clear and moist.  Eyes: Conjunctivae and EOM are normal. Pupils are equal, round, and reactive to light.  Neck: Normal range of motion. Neck supple. No JVD present. No tracheal deviation present or significant neck LA or mass Cardiovascular: Normal rate, regular rhythm, normal heart sounds and intact distal pulses.   Pulmonary/Chest: Effort normal and breath sounds without rales or wheezing  Abdominal:  Soft. Bowel sounds are normal. NT. No HSM  Musculoskeletal: Normal range of motion. Exhibits no edema.  Lymphadenopathy:  Has no cervical adenopathy.  Neurological: Pt is alert and oriented to person, place, and time. Pt has normal reflexes. No cranial nerve deficit. Motor grossly intact Skin: Skin is warm and dry. No rash noted.  Psychiatric:  Has normal mood and affect. Behavior is normal.         Assessment & Plan:

## 2015-05-23 NOTE — Patient Instructions (Signed)
Please start OTC iron sulfate 325 mg - 1 per day  Please continue all other medications as before, and refills have been done if requested.  Please have the pharmacy call with any other refills you may need.  Please continue your efforts at being more active, low cholesterol diet, and weight control.  You are otherwise up to date with prevention measures today.  Please keep your appointments with your specialists as you may have planned  Please go to the LAB in the Basement (turn left off the elevator) for the tests to be done today  You will be contacted by phone if any changes need to be made immediately.  Otherwise, you will receive a letter about your results with an explanation, but please check with MyChart first.  Please remember to sign up for MyChart if you have not done so, as this will be important to you in the future with finding out test results, communicating by private email, and scheduling acute appointments online when needed.  Please return in 1 year for your yearly visit, or sooner if needed, with Lab testing done 3-5 days before

## 2015-05-23 NOTE — Assessment & Plan Note (Signed)

## 2015-05-23 NOTE — Progress Notes (Signed)
Pre visit review using our clinic review tool, if applicable. No additional management support is needed unless otherwise documented below in the visit note. 

## 2015-05-24 ENCOUNTER — Other Ambulatory Visit: Payer: Self-pay | Admitting: Internal Medicine

## 2015-05-24 DIAGNOSIS — D509 Iron deficiency anemia, unspecified: Secondary | ICD-10-CM

## 2015-05-24 LAB — URINALYSIS, ROUTINE W REFLEX MICROSCOPIC
Bilirubin Urine: NEGATIVE
Hgb urine dipstick: NEGATIVE
KETONES UR: NEGATIVE
LEUKOCYTES UA: NEGATIVE
NITRITE: NEGATIVE
PH: 6 (ref 5.0–8.0)
Specific Gravity, Urine: 1.025 (ref 1.000–1.030)
Total Protein, Urine: NEGATIVE
Urine Glucose: NEGATIVE
Urobilinogen, UA: 0.2 (ref 0.0–1.0)

## 2015-06-17 ENCOUNTER — Encounter: Payer: Self-pay | Admitting: Internal Medicine

## 2015-08-20 ENCOUNTER — Other Ambulatory Visit (INDEPENDENT_AMBULATORY_CARE_PROVIDER_SITE_OTHER): Payer: BC Managed Care – PPO

## 2015-08-20 ENCOUNTER — Ambulatory Visit (INDEPENDENT_AMBULATORY_CARE_PROVIDER_SITE_OTHER): Payer: BC Managed Care – PPO | Admitting: Internal Medicine

## 2015-08-20 ENCOUNTER — Encounter: Payer: Self-pay | Admitting: Internal Medicine

## 2015-08-20 VITALS — BP 108/78 | HR 72 | Ht 65.5 in | Wt 184.3 lb

## 2015-08-20 DIAGNOSIS — K2100 Gastro-esophageal reflux disease with esophagitis, without bleeding: Secondary | ICD-10-CM

## 2015-08-20 DIAGNOSIS — D509 Iron deficiency anemia, unspecified: Secondary | ICD-10-CM | POA: Diagnosis not present

## 2015-08-20 DIAGNOSIS — Z9884 Bariatric surgery status: Secondary | ICD-10-CM

## 2015-08-20 LAB — CBC WITH DIFFERENTIAL/PLATELET
BASOS PCT: 0.4 % (ref 0.0–3.0)
Basophils Absolute: 0 10*3/uL (ref 0.0–0.1)
EOS ABS: 0.1 10*3/uL (ref 0.0–0.7)
Eosinophils Relative: 1.4 % (ref 0.0–5.0)
HEMATOCRIT: 36.4 % (ref 36.0–46.0)
HEMOGLOBIN: 12 g/dL (ref 12.0–15.0)
LYMPHS PCT: 31.3 % (ref 12.0–46.0)
Lymphs Abs: 1.3 10*3/uL (ref 0.7–4.0)
MCHC: 33 g/dL (ref 30.0–36.0)
MCV: 83.5 fl (ref 78.0–100.0)
MONOS PCT: 10 % (ref 3.0–12.0)
Monocytes Absolute: 0.4 10*3/uL (ref 0.1–1.0)
Neutro Abs: 2.3 10*3/uL (ref 1.4–7.7)
Neutrophils Relative %: 56.9 % (ref 43.0–77.0)
Platelets: 222 10*3/uL (ref 150.0–400.0)
RBC: 4.35 Mil/uL (ref 3.87–5.11)
RDW: 23 % — AB (ref 11.5–15.5)
WBC: 4.1 10*3/uL (ref 4.0–10.5)

## 2015-08-20 MED ORDER — NA SULFATE-K SULFATE-MG SULF 17.5-3.13-1.6 GM/177ML PO SOLN: 1.0000 | Freq: Once | ORAL | Status: DC

## 2015-08-20 NOTE — Progress Notes (Signed)
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 Oliver Barre regarding iron deficiency anemia. I saw the patient and 2007 for chronic reflux symptoms. She did undergo upper endoscopy on 07/07/2006. This revealed mild reflux esophagitis and a small hiatal hernia. She was continued on reflux precautions and PPI. I have reviewed the patient's outside records, radiographs, and laboratories. Patient has been anemic for at least 3 years. Anemia has been progressive each year. Most recent hemoglobin in June was 8.4 with MCV of 71.1. She was started on iron supplement which she takes once daily with lunch. She denies melena or hematochezia. No Hemoccults studies. GI review of systems is remarkable for intermittent GERD, belching, and fluctuating weight. She does have monthly menstrual periods last approximate 3 days. She does not donate blood. No NSAIDs. Since her bariatric surgery she has lost 130 pounds. She has had to have her lap band adjusted on at least one occasion. GI review of systems otherwise negative. She has been taking H2 receptor antagonists sporadically for GERD  REVIEW OF SYSTEMS:  All non-GI ROS negative except for anxiety, headaches, depression,  Past Medical History  Diagnosis Date  . Chicken pox   . Anxiety   . Depression   . Headache(784.0)   . Hyperlipidemia   . Hypertension   . Morbid obesity   . Elevated prolactin level     elevated in 2009. MRI brain normal  . Mammary duct ectasia of right breast 2009    excision of duct - Dr. Davina Poke 2010 - nonmalignant path  . Hiatal hernia   . GERD (gastroesophageal reflux disease)     Past Surgical History  Procedure Laterality Date  . Bladder surgery      childhood  . Lapband procedure  5-11    [facility]  . Breast lesion excison Right Jan 2010    Dr. Luisa Hart. Ductal ectasia  .  Mouth surgery      Social History Katherine Tucker  reports that she has never smoked. She has never used smokeless tobacco. She reports that she does not drink alcohol or use illicit drugs.  family history includes Arthritis in her mother; Cancer in her paternal aunt; Diabetes in her other; Hyperlipidemia in her father and mother; Hypertension in her father and mother.  Allergies  Allergen Reactions  . Contrast Media [Iodinated Diagnostic Agents]        PHYSICAL EXAMINATION: Vital signs: BP 108/78 mmHg  Pulse 72  Ht 5' 5.5" (1.664 m)  Wt 184 lb 4.8 oz (83.598 kg)  BMI 30.19 kg/m2  LMP 07/17/2015  Constitutional: generally well-appearing, no acute distress Psychiatric: alert and oriented x3, cooperative Eyes: extraocular movements intact, anicteric, conjunctiva pink Mouth: oral pharynx moist, no lesions Neck: supple without thyromegaly Lymph: no lymphadenopathy Cardiovascular: heart regular rate and rhythm, no murmur Lungs: clear to auscultation bilaterally Abdomen: soft, nontender, nondistended, no obvious ascites, no peritoneal signs, normal bowel sounds, no organomegaly Rectal: Deferred until colonoscopy Extremities: no clubbing cyanosis or lower extremity edema bilaterally Skin: no lesions on visible extremities Neuro: No focal deficits. Deep tendon reflexes intact cranial nerves intact.   ASSESSMENT:  #1. Iron deficiency anemia. May be due to chronic menstrual blood loss. Rule out significant occult GI mucosal lesion #2. History of morbid obesity status post lap band procedure May 2011. 130 pound weight loss since #3.  GERD. Managed with on demand H2 receptor antagonist therapy #4. Gen. medical problems. Stable   PLAN:  #1. Follow-up CBC today to assess if the patient is responding to iron therapy #2. Schedule colonoscopy and upper endoscopy to exclude GI mucosal pathology as a cause for iron deficiency anemia. We prescribed and I discussed her prep with her  detail.The nature of the procedure, as well as the risks, benefits, and alternatives were carefully and thoroughly reviewed with the patient. Ample time for discussion and questions allowed. The patient understood, was satisfied, and agreed to proceed. #3. If no significant GI mucosal pathology on endoscopic evaluations, would recommend continuing iron supplementation as long as she has menstrual periods  A copy of this consultation has been sent to Dr. Oliver Barre  ADDENDUM CBC from earlier today has returned and is normal. MCV normal. Obviously responding to iron therapy. She will continue on iron therapy and follow through with endoscopic evaluations as arranged. Patient notified

## 2015-08-20 NOTE — Patient Instructions (Signed)
Your physician has requested that you go to the basement for the following lab work before leaving today:  CBC  You have been scheduled for an endoscopy and colonoscopy. Please follow the written instructions given to you at your visit today. Please pick up your prep supplies at the pharmacy within the next 1-3 days. If you use inhalers (even only as needed), please bring them with you on the day of your procedure. Your physician has requested that you go to www.startemmi.com and enter the access code given to you at your visit today. This web site gives a general overview about your procedure. However, you should still follow specific instructions given to you by our office regarding your preparation for the procedure.  Per Dr. Marina Goodell, please hold your iron for one week prior to the procedure

## 2015-08-30 ENCOUNTER — Encounter: Payer: Self-pay | Admitting: Family

## 2015-08-30 ENCOUNTER — Ambulatory Visit (INDEPENDENT_AMBULATORY_CARE_PROVIDER_SITE_OTHER): Payer: BC Managed Care – PPO | Admitting: Family

## 2015-08-30 VITALS — BP 112/78 | HR 68 | Temp 98.8°F | Resp 18 | Ht 66.0 in | Wt 188.8 lb

## 2015-08-30 DIAGNOSIS — M79641 Pain in right hand: Secondary | ICD-10-CM

## 2015-08-30 DIAGNOSIS — R059 Cough, unspecified: Secondary | ICD-10-CM

## 2015-08-30 DIAGNOSIS — R05 Cough: Secondary | ICD-10-CM | POA: Insufficient documentation

## 2015-08-30 MED ORDER — HYDROCODONE-HOMATROPINE 5-1.5 MG/5ML PO SYRP: 5.0000 mL | ORAL_SOLUTION | Freq: Three times a day (TID) | ORAL | Status: DC | PRN

## 2015-08-30 MED ORDER — AZITHROMYCIN 250 MG PO TABS: ORAL_TABLET | ORAL | Status: DC

## 2015-08-30 MED ORDER — FLUCONAZOLE 150 MG PO TABS
150.0000 mg | ORAL_TABLET | Freq: Once | ORAL | Status: DC
Start: 2015-08-30 — End: 2016-05-28

## 2015-08-30 NOTE — Progress Notes (Signed)
Subjective:    Patient ID: Katherine Tucker, female    DOB: [birth year], 44 y.o.   MRN: [id number]  Chief Complaint  Patient presents with  . Cough    x1 week, congestion, deep productive cough, has broken out in a sweat a couple times thinks she may have had a fever, and very fatigue, had a fall 2 months ago and hurt two of her middle fingers on right hand that she would like checked out bc they are still hurting    HPI:  Katherine Tucker is a 44 y.o. female who  has a past medical history of Chicken pox; Anxiety; Depression; Headache(784.0); Hyperlipidemia; Hypertension; Morbid obesity; Elevated prolactin level; Mammary duct ectasia of right breast (2009); Hiatal hernia; and GERD (gastroesophageal reflux disease). and presents today for an acute office visit.  1.) Cough - this is a new problem. Associated symptoms of congestion, productive cough with nonpurulent sputum, question of fever, and fatigue has been going on for approximately one week. Experiences coughing fits that wax and wane. Modifying factors include Alkaseltzer cold/flu and Mucinex which may help a little, but once they wear off the symptoms return.    2.) Right hand pain - this is a new problem. Associated symptoms of pain located in her right hand has been going on for approximately 2 months following a fall. Pain is described as sharp and burining type pain. Denies any modifying factors that make the pain better or worse.    Allergies  Allergen Reactions  . Contrast Media [Iodinated Diagnostic Agents]     Current Outpatient Prescriptions on File Prior to Visit  Medication Sig Dispense Refill  . Calcium 1500 MG tablet Take 1,500 mg by mouth daily.      . citalopram (CELEXA) 40 MG tablet Take 1 tablet (40 mg total) by mouth daily. 90 tablet 3  . metoprolol tartrate (LOPRESSOR) 25 MG tablet Take 1 tablet (25 mg total) by mouth 2 (two) times daily. 180 tablet 3  . multivitamin (THERAGRAN) tablet Take 1 tablet by mouth  daily. Centrum [place].    . Na Sulfate-K Sulfate-Mg Sulf SOLN Take 1 kit by mouth once. 354 mL 0   No current facility-administered medications on file prior to visit.     Past Surgical History  Procedure Laterality Date  . Bladder surgery      childhood  . Lapband procedure  5-11    [facility]  . Breast lesion excison Right Jan 2010    Dr. Brantley Stage. Ductal ectasia  . Mouth surgery      Review of Systems  Constitutional: Positive for fever and fatigue. Negative for chills.  HENT: Positive for congestion, ear pain and rhinorrhea. Negative for sore throat.   Respiratory: Positive for cough. Negative for chest tightness and shortness of breath.   Musculoskeletal:       Positive for hand pain.   Neurological: Negative for headaches.      Objective:    BP 112/78 mmHg  Pulse 68  Temp(Src) 98.8 F (37.1 C) (Oral)  Resp 18  Ht '5\' 6"'  (1.676 m)  Wt 188 lb 12.8 oz (85.639 kg)  BMI 30.49 kg/m2  SpO2 96%  LMP 07/17/2015 Nursing note and vital signs reviewed.  Physical Exam  Constitutional: She is oriented to person, place, and time. She appears well-developed and well-nourished. No distress.  HENT:  Right Ear: Hearing, tympanic membrane, external ear and ear canal normal.  Left Ear: Hearing, tympanic membrane, external ear and ear canal normal.  Nose: Nose normal. Right sinus exhibits no maxillary sinus tenderness and no frontal sinus tenderness. Left sinus exhibits no maxillary sinus tenderness and no frontal sinus tenderness.  Mouth/Throat: Uvula is midline, oropharynx is clear and moist and mucous membranes are normal.  Neck: Neck supple.  Cardiovascular: Normal rate, regular rhythm, normal heart sounds and intact distal pulses.   Pulmonary/Chest: Effort normal and breath sounds normal.  Musculoskeletal:  Right hand - no obvious deformity or discoloration of the third and fourth fingers. Questionable mild edema present in the PIP joint. Range of motion is intact and appropriate.  Grip strength is equal bilaterally. Proximal pulses and capillary refill are intact and appropriate.  Lymphadenopathy:    She has no cervical adenopathy.  Neurological: She is alert and oriented to person, place, and time.  Skin: Skin is warm and dry.  Psychiatric: She has a normal mood and affect. Her behavior is normal. Judgment and thought content normal.       Assessment & Plan:   Problem List Items Addressed This Visit      Other   Right hand pain - Primary    Right hand pain following a fall which is most likely consistent with a contusion as there is no deformity. Treat conservatively at this time and with ice/heat and over the counter medications as needed. Follow up for imaging if symptoms worsen or fail to improve.       Cough    Symptoms and exam consistent with upper respiratory infection that is potentially viral. Start hycodan as needed for cough and sleep. Given prescription for azithromycin to use if symptoms worsen in the next 2-3 days and diflucan as needed for post-antibiotic candidiasis. Continue over the counter medications as needed for symptom relief and supportive care. Follow up if symptoms worsen or do improve following antibiotic treatment if needed.       Relevant Medications   fluconazole (DIFLUCAN) 150 MG tablet   azithromycin (ZITHROMAX) 250 MG tablet   HYDROcodone-homatropine (HYCODAN) 5-1.5 MG/5ML syrup

## 2015-08-30 NOTE — Assessment & Plan Note (Signed)
Right hand pain following a fall which is most likely consistent with a contusion as there is no deformity. Treat conservatively at this time and with ice/heat and over the counter medications as needed. Follow up for imaging if symptoms worsen or fail to improve.

## 2015-08-30 NOTE — Patient Instructions (Signed)
Thank you for choosing [facility].  Summary/Instructions:  Your prescription(s) have been submitted to your pharmacy or been printed and provided for you. Please take as directed and contact our office if you believe you are having problem(s) with the medication(s) or have any questions.  If your symptoms worsen or fail to improve, please contact our office for further instruction, or in case of emergency go directly to the emergency room at the closest medical facility.   General Recommendations:    Please drink plenty of fluids.  Get plenty of rest   Sleep in humidified air  Use saline nasal sprays  Netti pot   OTC Medications:  Decongestants - helps relieve congestion   Flonase (generic fluticasone) or Nasacort (generic triamcinolone) - please make sure to use the "cross-over" technique at a 45 degree angle towards the opposite eye as opposed to straight up the nasal passageway.   Sudafed (generic pseudoephedrine - Note this is the one that is available behind the pharmacy counter); Products with phenylephrine (-PE) may also be used but is often not as effective as pseudoephedrine.   If you have HIGH BLOOD PRESSURE - Coricidin HBP; AVOID any product that is -D as this contains pseudoephedrine which may increase your blood pressure.  Afrin (oxymetazoline) every 6-8 hours for up to 3 days.   Allergies - helps relieve runny nose, itchy eyes and sneezing   Claritin (generic loratidine), Allegra (fexofenidine), or Zyrtec (generic cyrterizine) for runny nose. These medications should not cause drowsiness.  Note - Benadryl (generic diphenhydramine) may be used however may cause drowsiness  Cough -   Delsym or Robitussin (generic dextromethorphan)  Expectorants - helps loosen mucus to ease removal   Mucinex (generic guaifenesin) as directed on the package.  Headaches / General Aches   Tylenol (generic acetaminophen) - DO NOT EXCEED 3 grams (3,000 mg) in a 24  hour time period  Advil/Motrin (generic ibuprofen)   Sore Throat -   Salt water gargle   Chloraseptic (generic benzocaine) spray or lozenges / Sucrets (generic dyclonine)    For your fingers recommend tylenol as needed for discomfort, gripping exercises and follow up if symptoms worsen.

## 2015-08-30 NOTE — Progress Notes (Signed)
Pre visit review using our clinic review tool, if applicable. No additional management support is needed unless otherwise documented below in the visit note.   Going to wait to get flu shot at her job

## 2015-08-30 NOTE — Assessment & Plan Note (Addendum)
Symptoms and exam consistent with upper respiratory infection that is potentially viral. Start hycodan as needed for cough and sleep. Given prescription for azithromycin to use if symptoms worsen in the next 2-3 days and diflucan as needed for post-antibiotic candidiasis. Continue over the counter medications as needed for symptom relief and supportive care. Follow up if symptoms worsen or do improve following antibiotic treatment if needed.

## 2015-10-09 ENCOUNTER — Telehealth: Payer: Self-pay | Admitting: Internal Medicine

## 2015-10-10 ENCOUNTER — Other Ambulatory Visit: Payer: Self-pay | Admitting: Internal Medicine

## 2015-10-11 ENCOUNTER — Other Ambulatory Visit: Payer: Self-pay

## 2015-10-11 ENCOUNTER — Ambulatory Visit (AMBULATORY_SURGERY_CENTER): Payer: BC Managed Care – PPO | Admitting: Internal Medicine

## 2015-10-11 ENCOUNTER — Encounter: Payer: Self-pay | Admitting: Internal Medicine

## 2015-10-11 VITALS — BP 105/71 | HR 69 | Temp 97.5°F | Resp 13 | Ht 65.5 in | Wt 184.0 lb

## 2015-10-11 DIAGNOSIS — K209 Esophagitis, unspecified without bleeding: Secondary | ICD-10-CM

## 2015-10-11 DIAGNOSIS — D509 Iron deficiency anemia, unspecified: Secondary | ICD-10-CM | POA: Diagnosis present

## 2015-10-11 DIAGNOSIS — R1084 Generalized abdominal pain: Secondary | ICD-10-CM

## 2015-10-11 MED ORDER — SODIUM CHLORIDE 0.9 % IV SOLN: 500.0000 mL | INTRAVENOUS | Status: DC

## 2015-10-11 NOTE — Patient Instructions (Signed)

## 2015-10-11 NOTE — Progress Notes (Signed)
Report to PACU, RN, vss, BBS= Clear.  Pt had good amount of "peroxide looking" fluid come up during colon.  Pt head dropped and oropharynx suctioned.  Dr Marina GoodellPerry notified.  During EGD Dr Marina GoodellPerry stated she had a lot of fluid still in stomach.  Erskine SquibbJane RN notified of event

## 2015-10-11 NOTE — Op Note (Signed)
 [facility] [address] [place], [id number]   COLONOSCOPY PROCEDURE REPORT  PATIENT: Katherine Tucker, [name]  MR#: [id number] BIRTHDATE: [birth year] , 44  yrs. old GENDER: female ENDOSCOPIST: Roxy CedarJohn N Rajon Bisig Jr, MD REFERRED XB:JYNWGBY:James Asaiah Scarber, M.D. PROCEDURE DATE:  10/11/2015 PROCEDURE:   Colonoscopy, diagnostic First Screening Colonoscopy - Avg.  risk and is 50 yrs.  old or older - No.  Prior Negative Screening - Now for repeat screening. N/A  History of Adenoma - Now for follow-up colonoscopy & has been > or = to 3 yrs.  N/A  Polyps removed today? No Recommend repeat exam, <10 yrs? No ASA CLASS:   Class II INDICATIONS:Unexplained iron deficiency anemia and Patient is not applicable for Colorectal Neoplasm Risk Assessment for this procedure. MEDICATIONS: Monitored anesthesia care and Propofol 250 mg IV  DESCRIPTION OF PROCEDURE:   After the risks benefits and alternatives of the procedure were thoroughly explained, informed consent was obtained.  The digital rectal exam revealed no abnormalities of the rectum.   The LB [id number]  endoscope was introduced through the anus and advanced to the cecum, which was identified by both the appendix and ileocecal valve. No adverse events experienced.   The quality of the prep was excellent. (Suprep was used)  The instrument was then slowly withdrawn as the colon was fully examined. Estimated blood loss is zero unless otherwise noted in this procedure report.  COLON FINDINGS: The examined terminal ileum appeared to be normal. 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   The examination was otherwise normal.  Retroflexed views revealed no abnormalities. The time to cecum = 2.1 Withdrawal time = 8.1   The scope was withdrawn and the procedure completed. COMPLICATIONS: There were no immediate complications.  ENDOSCOPIC IMPRESSION: 1.   The examined terminal ileum appeared to be normal 2.   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    The examination was otherwise normal  RECOMMENDATIONS: 1.  Continue current colorectal screening recommendations for "routine risk" patients with a repeat colonoscopy in 10 years. 2.  Upper endoscopy today (please see report)  eSigned:  Roxy CedarJohn N Ron Beske Jr, MD 10/11/2015 2:47 PM   cc: The Patient and Corwin LevinsJames W Olyver Hawes, MD

## 2015-10-11 NOTE — Telephone Encounter (Signed)
Replaced patient's coupon and gave her reprinted instructions, reviewing them with her as well

## 2015-10-14 ENCOUNTER — Telehealth: Payer: Self-pay

## 2015-10-14 NOTE — Op Note (Signed)
[facility] [address] [place], [id number]   ENDOSCOPY PROCEDURE REPORT  PATIENT: Katherine Tucker, Katherine Tucker  MR#: [id number] BIRTHDATE: February 16, 1971 , 44  yrs. old GENDER: female ENDOSCOPIST: Roxy CedarJohn N Perry Jr, MD REFERRED BY:  Oliver BarreJames John, M.D. PROCEDURE DATE:  10/11/2015 PROCEDURE:  EGD, diagnostic ASA CLASS:     Class II INDICATIONS:  iron deficiency anemia. MEDICATIONS: Monitored anesthesia care and Propofol 100 mg IV TOPICAL ANESTHETIC: none  DESCRIPTION OF PROCEDURE: After the risks benefits and alternatives of the procedure were thoroughly explained, informed consent was obtained.  The LB [id number] endoscope was introduced through the mouth and advanced to the second portion of the duodenum , Without limitations.  The instrument was slowly withdrawn as the mucosa was fully examined.  EXAMThe esophagus was markedly dilated and fluid-filled.  The gastroesophageal junction revealed erosions consistent with mild esophagitis.  A gastric pouch measuring about 4 cm was present above the lap band.  Stomach was normal except for presence of the lap band.  The duodenum was normal.  Retroflexed views revealed as previously described.     The scope was then withdrawn from the patient and the procedure completed.  COMPLICATIONS: There were no immediate complications.  ENDOSCOPIC IMPRESSION: 1. 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 Mild esophagitis  RECOMMENDATIONS: 1.  Upper GI Series to be scheduled "evaluate esophagus and stomach post lap band" 2.  Continue iron supplement as long as you are having menstrual periods 3. May need to return to [facility] to have LAP-BAND volume reduced  REPEAT EXAM:  eSigned:  Roxy CedarJohn N Perry Jr, MD 10/11/2015 2:56 PM    CC:The Patient, Corwin LevinsJames W John, MD, and Glenna FellowsBenjamin [facility], MD

## 2015-10-14 NOTE — Telephone Encounter (Signed)
  Follow up Call-  Call back number 10/11/2015  Post procedure Call Back phone  # [phone]  Permission to leave phone message Yes     Patient questions:  Do you have a fever, pain , or abdominal swelling? No. Pain Score  0 *  Have you tolerated food without any problems? Yes.    Have you been able to return to your normal activities? Yes.    Do you have any questions about your discharge instructions: Diet   No. Medications  No. Follow up visit  No.  Do you have questions or concerns about your Care? No.  Actions: * If pain score is 4 or above: No action needed, pain <4.

## 2015-10-14 NOTE — Telephone Encounter (Signed)
[facility]@9 :30am, pt to arrive there at 9:15am. Pt to be NPO after midnight. Left message for pt to call back.

## 2015-10-15 NOTE — Telephone Encounter (Signed)
Spoke with pt and she is aware.

## 2015-10-18 ENCOUNTER — Ambulatory Visit (HOSPITAL_COMMUNITY)
Admission: RE | Admit: 2015-10-18 | Discharge: 2015-10-18 | Disposition: A | Discharge status: Home / Self Care | Payer: BC Managed Care – PPO | Source: Ambulatory Visit | Attending: Internal Medicine | Admitting: Internal Medicine

## 2015-10-18 DIAGNOSIS — J029 Acute pharyngitis, unspecified: Secondary | ICD-10-CM | POA: Insufficient documentation

## 2015-10-18 DIAGNOSIS — R1084 Generalized abdominal pain: Secondary | ICD-10-CM | POA: Diagnosis present

## 2015-10-18 DIAGNOSIS — K210 Gastro-esophageal reflux disease with esophagitis: Secondary | ICD-10-CM | POA: Diagnosis not present

## 2015-10-18 DIAGNOSIS — Z9884 Bariatric surgery status: Secondary | ICD-10-CM | POA: Insufficient documentation

## 2015-11-06 ENCOUNTER — Other Ambulatory Visit: Payer: Self-pay | Admitting: Internal Medicine

## 2016-03-09 ENCOUNTER — Encounter: Payer: Self-pay | Admitting: Family Medicine

## 2016-03-09 ENCOUNTER — Ambulatory Visit (INDEPENDENT_AMBULATORY_CARE_PROVIDER_SITE_OTHER): Payer: BC Managed Care – PPO | Admitting: Family Medicine

## 2016-03-09 VITALS — BP 104/80 | HR 70 | Temp 98.7°F | Wt 206.9 lb

## 2016-03-09 DIAGNOSIS — R059 Cough, unspecified: Secondary | ICD-10-CM

## 2016-03-09 DIAGNOSIS — R05 Cough: Secondary | ICD-10-CM | POA: Diagnosis not present

## 2016-03-09 DIAGNOSIS — J0140 Acute pansinusitis, unspecified: Secondary | ICD-10-CM | POA: Diagnosis not present

## 2016-03-09 MED ORDER — HYDROCODONE-HOMATROPINE 5-1.5 MG/5ML PO SYRP: 5.0000 mL | ORAL_SOLUTION | Freq: Three times a day (TID) | ORAL | Status: DC | PRN

## 2016-03-09 MED ORDER — AMOXICILLIN-POT CLAVULANATE 875-125 MG PO TABS: 1.0000 | ORAL_TABLET | Freq: Two times a day (BID) | ORAL | Status: DC

## 2016-03-09 MED ORDER — BENZONATATE 100 MG PO CAPS
100.0000 mg | ORAL_CAPSULE | Freq: Three times a day (TID) | ORAL | Status: DC
Start: 2016-03-09 — End: 2016-05-28

## 2016-03-09 NOTE — Progress Notes (Signed)
Pre visit review using our clinic review tool, if applicable. No additional management support is needed unless otherwise documented below in the visit note. 

## 2016-03-09 NOTE — Progress Notes (Signed)
Subjective:    Patient ID: Katherine Tucker, female    DOB: Jan 07, 1971, 45 y.o.   MRN: [id number]  HPI  Katherine Tucker is a 45 year old female who presents today with symptoms for 6 days of history of rhinitis with green/yellow mucous, congestion, sinus pressure, ear pressure/pain, PND, and cough that has not been productive but keeps her up at night. She denies fever, chills, sweats, symptoms of GERD which is controlled with zantac, and N/V/D. Treatment of theraflu, alka seltzer cold and cough, mucinex cough which has provided minimal benefit.  Recent exposure of sick contacts at work and no recent antibiotic use. No history of flu vaccine this season   Review of Systems  Constitutional: Negative for fever and chills.  HENT: Positive for congestion, ear pain, postnasal drip, rhinorrhea, sinus pressure and sore throat.   Respiratory: Positive for cough. Negative for shortness of breath and wheezing.   Cardiovascular: Negative for chest pain, palpitations and leg swelling.  Gastrointestinal: Negative for nausea, vomiting, diarrhea and constipation.       History of a bariatric surgery and she noted one episode of vomiting which has resolved. She stated that this was not unusual for her as this occurs on rare basis.   Genitourinary: Negative for dysuria, urgency, frequency, hematuria and flank pain.  Musculoskeletal: Negative for myalgias.  Skin: Negative for rash.  Neurological: Negative for dizziness and headaches.  Psychiatric/Behavioral: The patient is not nervous/anxious.    Past Medical History  Diagnosis Date  . Chicken pox   . Anxiety   . Depression   . Headache(784.0)   . Hyperlipidemia   . Hypertension   . Morbid obesity (HCC)   . Elevated prolactin level (HCC)     elevated in 2009. MRI brain normal  . Mammary duct ectasia of right breast 2009    excision of duct - Dr. Davina Pokeornet 2010 - nonmalignant path  . Hiatal hernia   . GERD (gastroesophageal reflux disease)      Social History   Social History  . Marital Status: Single    Spouse Name: N/A  . Number of Children: N/A  . Years of Education: 8919   Occupational History  . 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 Smoking status: Never Smoker   . Smokeless tobacco: Never Used  . Alcohol Use: No  . Drug Use: No  . Sexual Activity: No   Other Topics Concern  . Not on file   Social History Narrative   HSG, Completed Field seismologistUndergraduate Bachelor's, [employer]. Single. 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 Lives alone w/ 2 dogs. Remains active and in touch with her sorority sisters. Her mother lives in [place] and they are close.           Past Surgical History  Procedure Laterality Date  . Bladder surgery      childhood  . Lapband procedure  5-11    [facility]  . Breast lesion excison Right Jan 2010    Dr. Luisa Hartornett. Ductal ectasia  . Mouth surgery      Family History  Problem Relation Age of Onset  . Arthritis Mother   . Hypertension Mother   . Hyperlipidemia Mother   . Hypertension Father   . Hyperlipidemia Father   . Cancer Paternal Aunt     breast  . Diabetes Other     Allergies  Allergen Reactions  .  Contrast Media [Iodinated Diagnostic Agents]     Current Outpatient Prescriptions on File Prior to Visit  Medication Sig Dispense Refill  . Calcium 1500 MG tablet Take 1,500 mg by mouth daily.      . citalopram (CELEXA) 40 MG tablet Take 1 tablet (40 mg total) by mouth daily. 90 tablet 3  . fluconazole (DIFLUCAN) 150 MG tablet Take 1 tablet (150 mg total) by mouth once. 1 tablet 2  . metoprolol tartrate (LOPRESSOR) 25 MG tablet Take 1 tablet (25 mg total) by mouth 2 (two) times daily. 180 tablet 3  . metoprolol tartrate (LOPRESSOR) 25 MG tablet TAKE 1 TABLET (25 MG TOTAL) BY MOUTH 2 (TWO) TIMES DAILY. 60 tablet 5  . multivitamin (THERAGRAN) tablet Take 1 tablet by mouth daily. Centrum [place].      No current facility-administered medications on file prior to visit.    BP 104/80 mmHg  Pulse 70  Temp(Src) 98.7 F (37.1 C) (Oral)  Wt 206 lb 14.4 oz (93.849 kg)      Objective:congestion, sore throat,    Physical Exam  Constitutional: She is oriented to person, place, and time. She appears well-developed and well-nourished.  HENT:  Nose: Right sinus exhibits maxillary sinus tenderness and frontal sinus tenderness. Left sinus exhibits maxillary sinus tenderness and frontal sinus tenderness.  Mouth/Throat: No oropharyngeal exudate or posterior oropharyngeal erythema.  TMs dull bilaterally, patient notes a history of otitis media as an adult. Unable to fully visualize R TM due to cerumen.  Eyes: Pupils are equal, round, and reactive to light.  Cardiovascular: Normal rate and regular rhythm.   No murmur heard. Pulmonary/Chest: Effort normal and breath sounds normal. She has no wheezes. She has no rales.  Abdominal: Soft. Bowel sounds are normal. There is no tenderness.  Lymphadenopathy:    She has cervical adenopathy.  Neurological: She is alert and oriented to person, place, and time.  Skin: Skin is warm and dry. No rash noted.  Psychiatric: She has a normal mood and affect. Her behavior is normal.      Assessment & Plan:  1. Cough  - HYDROcodone-homatropine (HYCODAN) 5-1.5 MG/5ML syrup; Take 5 mLs by mouth every 8 (eight) hours as needed for cough.  Dispense: 120 mL; Refill: 0  2. Acute pansinusitis, recurrence not specified  - amoxicillin-clavulanate (AUGMENTIN) 875-125 MG tablet; Take 1 tablet by mouth 2 (two) times daily.  Dispense: 20 tablet; Refill: 0   Symptoms and exam consistent with upper respiratory infection that is potentially viral.. Start hycodan as needed for cough and sleep. Given prescription for augmentin to use if symptoms worsen in the next 2-3 days  and diflucan prescription provided previously is still active as needed for post-antibiotic candidiasis.  Discussed with patient the potential interaction between citalopram and fluconazole and advised her to use only if needed and on a single dose basis. Continue over the counter medications as needed for symptom relief and supportive care. Follow up if symptoms worsen, fever >101 develops, or symptoms do not improve following antibiotic treatment if needed. Patient voiced understanding and agreed with plan.

## 2016-03-09 NOTE — Patient Instructions (Signed)

## 2016-03-14 ENCOUNTER — Encounter: Payer: Self-pay | Admitting: Internal Medicine

## 2016-03-14 ENCOUNTER — Ambulatory Visit (INDEPENDENT_AMBULATORY_CARE_PROVIDER_SITE_OTHER): Payer: BC Managed Care – PPO | Admitting: Internal Medicine

## 2016-03-14 VITALS — BP 120/80 | HR 67 | Temp 99.2°F | Ht 65.0 in | Wt 206.0 lb

## 2016-03-14 DIAGNOSIS — R059 Cough, unspecified: Secondary | ICD-10-CM

## 2016-03-14 DIAGNOSIS — R05 Cough: Secondary | ICD-10-CM

## 2016-03-14 MED ORDER — HYDROCODONE-ACETAMINOPHEN 7.5-325 MG/15ML PO SOLN: 15.0000 mL | Freq: Every evening | ORAL | Status: DC | PRN

## 2016-03-14 NOTE — Assessment & Plan Note (Signed)
Will change to hycet syrup which the pharmacy has

## 2016-03-14 NOTE — Progress Notes (Signed)
Subjective:    Patient ID: Katherine KiefKelly R Sweaney, female    DOB: [birth year], 45 y.o.   MRN: [id number]  HPI Seen earlier this week Unable to get the hycodan--on back order Feels some better but still bad cough at night Hasn't yet started the antibiotic  Didn't really feel she needed reevaluation  Current Outpatient Prescriptions on File Prior to Visit  Medication Sig Dispense Refill  . benzonatate (TESSALON) 100 MG capsule Take 1 capsule (100 mg total) by mouth 3 (three) times daily. 20 capsule 0  . Calcium 1500 MG tablet Take 1,500 mg by mouth daily.      . citalopram (CELEXA) 40 MG tablet Take 1 tablet (40 mg total) by mouth daily. 90 tablet 3  . fluconazole (DIFLUCAN) 150 MG tablet Take 1 tablet (150 mg total) by mouth once. 1 tablet 2  . metoprolol tartrate (LOPRESSOR) 25 MG tablet Take 1 tablet (25 mg total) by mouth 2 (two) times daily. 180 tablet 3  . metoprolol tartrate (LOPRESSOR) 25 MG tablet TAKE 1 TABLET (25 MG TOTAL) BY MOUTH 2 (TWO) TIMES DAILY. 60 tablet 5  . multivitamin (THERAGRAN) tablet Take 1 tablet by mouth daily. [place]rum [place].    . amoxicillin-clavulanate (AUGMENTIN) 875-125 MG tablet Take 1 tablet by mouth 2 (two) times daily. (Patient not taking: Reported on 03/14/2016) 20 tablet 0  . HYDROcodone-homatropine (HYCODAN) 5-1.5 MG/5ML syrup Take 5 mLs by mouth every 8 (eight) hours as needed for cough. (Patient not taking: Reported on 03/14/2016) 120 mL 0   No current facility-administered medications on file prior to visit.    Allergies  Allergen Reactions  . Contrast Media [Iodinated Diagnostic Agents]     Past Medical History  Diagnosis Date  . Chicken pox   . Anxiety   . Depression   . Headache(784.0)   . Hyperlipidemia   . Hypertension   . Morbid obesity (HCC)   . Elevated prolactin level (HCC)     elevated in 2009. MRI brain normal  . Mammary duct ectasia of right breast 2009    excision of duct - Dr. Davina Pokeornet 2010 - nonmalignant path  . Hiatal hernia     . GERD (gastroesophageal reflux disease)     Past Surgical History  Procedure Laterality Date  . Bladder surgery      childhood  . Lapband procedure  5-11    [facility]  . Breast lesion excison Right Jan 2010    Dr. Luisa Hartornett. Ductal ectasia  . Mouth surgery      Family History  Problem Relation Age of Onset  . Arthritis Mother   . Hypertension Mother   . Hyperlipidemia Mother   . Hypertension Father   . Hyperlipidemia Father   . Cancer Paternal Aunt     breast  . Diabetes Other     Social History   Social History  . Marital Status: Single    Spouse Name: N/A  . Number of Children: N/A  . Years of Education: 4119   Occupational History  . 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 Smoking status: Never Smoker   . Smokeless tobacco: Never Used  . Alcohol Use: No  . Drug Use: No  . Sexual Activity: No   Other Topics Concern  . Not on file   Social History Narrative   HSG, Completed Field seismologistUndergraduate Bachelor's, [employer]. Single. Work - MudloggerAssistant District Attorney - [employer] [place] Lives alone w/ 2 dogs. Remains active and in touch with her sorority sisters. Her mother lives in [place] and they are close.          Review of Systems     Objective:   Physical Exam        Assessment & Plan:

## 2016-03-14 NOTE — Progress Notes (Signed)
Pre visit review using our clinic review tool, if applicable. No additional management support is needed unless otherwise documented below in the visit note. 

## 2016-04-02 ENCOUNTER — Ambulatory Visit (INDEPENDENT_AMBULATORY_CARE_PROVIDER_SITE_OTHER): Payer: BC Managed Care – PPO | Admitting: Internal Medicine

## 2016-04-02 ENCOUNTER — Encounter: Payer: Self-pay | Admitting: Internal Medicine

## 2016-04-02 VITALS — BP 124/82 | HR 72 | Temp 98.8°F | Resp 20 | Wt 206.0 lb

## 2016-04-02 DIAGNOSIS — I10 Essential (primary) hypertension: Secondary | ICD-10-CM | POA: Diagnosis not present

## 2016-04-02 DIAGNOSIS — J069 Acute upper respiratory infection, unspecified: Secondary | ICD-10-CM | POA: Diagnosis not present

## 2016-04-02 DIAGNOSIS — R42 Dizziness and giddiness: Secondary | ICD-10-CM | POA: Diagnosis not present

## 2016-04-02 MED ORDER — LEVOFLOXACIN 500 MG PO TABS: 500.0000 mg | ORAL_TABLET | Freq: Every day | ORAL | Status: DC

## 2016-04-02 MED ORDER — MECLIZINE HCL 12.5 MG PO TABS: 12.5000 mg | ORAL_TABLET | Freq: Three times a day (TID) | ORAL | Status: DC | PRN

## 2016-04-02 NOTE — Progress Notes (Signed)
Pre visit review using our clinic review tool, if applicable. No additional management support is needed unless otherwise documented below in the visit note. 

## 2016-04-02 NOTE — Patient Instructions (Signed)
Please take all new medication as prescribed - the antibiotic, and meclizine for dizziness  Please continue all other medications as before, and refills have been done if requested.  Please have the pharmacy call with any other refills you may need.  Please keep your appointments with your specialists as you may have planned
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
## 2016-04-03 DIAGNOSIS — R42 Dizziness and giddiness: Secondary | ICD-10-CM | POA: Insufficient documentation

## 2016-04-03 DIAGNOSIS — J069 Acute upper respiratory infection, unspecified: Secondary | ICD-10-CM | POA: Insufficient documentation

## 2016-04-03 NOTE — Assessment & Plan Note (Signed)
Likely related to left middle ear involvement, for meclizine prn, mucinex otc prn,  to f/u any worsening symptoms or concerns

## 2016-04-03 NOTE — Assessment & Plan Note (Signed)
stable overall by history and exam, recent data reviewed with pt, and pt to continue medical treatment as before,  to f/u any worsening symptoms or concerns BP Readings from Last 3 Encounters:  04/02/16 124/82  03/14/16 120/80  03/09/16 104/80

## 2016-04-03 NOTE — Assessment & Plan Note (Signed)
Mild to mod, for antibx course,  to f/u any worsening symptoms or concerns 

## 2016-05-28 ENCOUNTER — Other Ambulatory Visit (INDEPENDENT_AMBULATORY_CARE_PROVIDER_SITE_OTHER): Payer: BC Managed Care – PPO

## 2016-05-28 ENCOUNTER — Ambulatory Visit (INDEPENDENT_AMBULATORY_CARE_PROVIDER_SITE_OTHER): Payer: BC Managed Care – PPO | Admitting: Internal Medicine

## 2016-05-28 ENCOUNTER — Encounter: Payer: Self-pay | Admitting: Internal Medicine

## 2016-05-28 VITALS — BP 122/70 | HR 60 | Temp 98.2°F | Resp 20 | Wt 206.0 lb

## 2016-05-28 DIAGNOSIS — E785 Hyperlipidemia, unspecified: Secondary | ICD-10-CM

## 2016-05-28 DIAGNOSIS — R6889 Other general symptoms and signs: Secondary | ICD-10-CM

## 2016-05-28 DIAGNOSIS — I10 Essential (primary) hypertension: Secondary | ICD-10-CM | POA: Diagnosis not present

## 2016-05-28 DIAGNOSIS — Z0001 Encounter for general adult medical examination with abnormal findings: Secondary | ICD-10-CM | POA: Diagnosis not present

## 2016-05-28 DIAGNOSIS — F32A Depression, unspecified: Secondary | ICD-10-CM

## 2016-05-28 DIAGNOSIS — F329 Major depressive disorder, single episode, unspecified: Secondary | ICD-10-CM

## 2016-05-28 DIAGNOSIS — K219 Gastro-esophageal reflux disease without esophagitis: Secondary | ICD-10-CM | POA: Diagnosis not present

## 2016-05-28 LAB — TSH: TSH: 1.74 u[IU]/mL (ref 0.35–4.50)

## 2016-05-28 LAB — HEPATIC FUNCTION PANEL
ALBUMIN: 3.9 g/dL (ref 3.5–5.2)
ALT: 14 U/L (ref 0–35)
AST: 20 U/L (ref 0–37)
Alkaline Phosphatase: 34 U/L — ABNORMAL LOW (ref 39–117)
BILIRUBIN DIRECT: 0 mg/dL (ref 0.0–0.3)
TOTAL PROTEIN: 6.8 g/dL (ref 6.0–8.3)
Total Bilirubin: 0.3 mg/dL (ref 0.2–1.2)

## 2016-05-28 LAB — BASIC METABOLIC PANEL
BUN: 17 mg/dL (ref 6–23)
CALCIUM: 9.8 mg/dL (ref 8.4–10.5)
CO2: 33 meq/L — AB (ref 19–32)
CREATININE: 0.98 mg/dL (ref 0.40–1.20)
Chloride: 102 mEq/L (ref 96–112)
GFR: 79.78 mL/min (ref >60.00)
GLUCOSE: 90 mg/dL (ref 70–99)
Potassium: 4.1 mEq/L (ref 3.5–5.1)
Sodium: 136 mEq/L (ref 135–145)

## 2016-05-28 LAB — CBC WITH DIFFERENTIAL/PLATELET
BASOS ABS: 0 10*3/uL (ref 0.0–0.1)
Basophils Relative: 0.4 % (ref 0.0–3.0)
EOS PCT: 2.2 % (ref 0.0–5.0)
Eosinophils Absolute: 0.1 10*3/uL (ref 0.0–0.7)
HEMATOCRIT: 33.5 % — AB (ref 36.0–46.0)
HEMOGLOBIN: 11 g/dL — AB (ref 12.0–15.0)
LYMPHS PCT: 34.7 % (ref 12.0–46.0)
Lymphs Abs: 1.7 10*3/uL (ref 0.7–4.0)
MCHC: 32.9 g/dL (ref 30.0–36.0)
MCV: 86.6 fl (ref 78.0–100.0)
MONOS PCT: 9.8 % (ref 3.0–12.0)
Monocytes Absolute: 0.5 10*3/uL (ref 0.1–1.0)
NEUTROS PCT: 52.9 % (ref 43.0–77.0)
Neutro Abs: 2.6 10*3/uL (ref 1.4–7.7)
PLATELETS: 208 10*3/uL (ref 150.0–400.0)
RBC: 3.87 Mil/uL (ref 3.87–5.11)
RDW: 14.4 % (ref 11.5–15.5)
WBC: 5 10*3/uL (ref 4.0–10.5)

## 2016-05-28 LAB — URINALYSIS, ROUTINE W REFLEX MICROSCOPIC
Bilirubin Urine: NEGATIVE
Hgb urine dipstick: NEGATIVE
Ketones, ur: NEGATIVE
Leukocytes, UA: NEGATIVE
Nitrite: NEGATIVE
TOTAL PROTEIN, URINE-UPE24: NEGATIVE
URINE GLUCOSE: NEGATIVE
UROBILINOGEN UA: 0.2 (ref 0.0–1.0)
pH: 5.5 (ref 5.0–8.0)

## 2016-05-28 LAB — LIPID PANEL
Cholesterol: 181 mg/dL (ref 0–200)
HDL: 82.3 mg/dL (ref >39.00)
LDL CALC: 91 mg/dL (ref 0–99)
NONHDL: 98.9
Total CHOL/HDL Ratio: 2
Triglycerides: 38 mg/dL (ref 0.0–149.0)
VLDL: 7.6 mg/dL (ref 0.0–40.0)

## 2016-05-28 NOTE — Progress Notes (Signed)
Subjective:    Patient ID: Katherine Tucker, female    DOB: [birth year], 45 y.o.   MRN: [id number]  HPI  Here for wellness and f/u;  Overall doing ok;  Pt denies Chest pain, worsening SOB, DOE, wheezing, orthopnea, PND, worsening LE edema, palpitations, dizziness or syncope.  .  Pt denies polydipsia, polyuria, or low sugar symptoms. Pt states overall good compliance with treatment and medications, good tolerability, and has been trying to follow appropriate diet.  Pt denies worsening depressive symptoms, suicidal ideation or panic. No fever, night sweats, wt loss, loss of appetite, or other constitutional symptoms.  Pt states good ability with ADL's, has low fall risk, home safety reviewed and adequate, no other significant changes in hearing or vision, and only occasionally active with exercise.    Overall good compliance with treatment, and good medicine tolerability.  Trying to follow lower chol diet, but occas not able.  Denies worsening depressive symptoms, suicidal ideation, or panic; no worsening recenlty. gastroesophageal reflux disease.   Pt denies new neurological symptoms such as new headache, or facial or extremity weakness or numbness Past Medical History  Diagnosis Date  . Chicken pox   . Anxiety   . Depression   . Headache(784.0)   . Hyperlipidemia   . Hypertension   . Morbid obesity (HCC)   . Elevated prolactin level (HCC)     elevated in 2009. MRI brain normal  . Mammary duct ectasia of right breast 2009    excision of duct - Dr. Davina Pokeornet 2010 - nonmalignant path  . Hiatal hernia   . GERD (gastroesophageal reflux disease)    Past Surgical History  Procedure Laterality Date  . Bladder surgery      childhood  . Lapband procedure  5-11    [facility]  . Breast lesion excison Right Jan 2010    Dr. Luisa Hartornett. Ductal ectasia  . Mouth surgery      reports that she has never smoked. She has never used smokeless tobacco. She reports that she does not drink alcohol or use illicit  drugs. family history includes Arthritis in her mother; Cancer in her paternal aunt; Diabetes in her other; Hyperlipidemia in her father and mother; Hypertension in her father and mother. Allergies  Allergen Reactions  . Contrast Media [Iodinated Diagnostic Agents]    Current Outpatient Prescriptions on File Prior to Visit  Medication Sig Dispense Refill  . Calcium 1500 MG tablet Take 1,500 mg by mouth daily.      . citalopram (CELEXA) 40 MG tablet Take 1 tablet (40 mg total) by mouth daily. 90 tablet 3  . HYDROcodone-acetaminophen (HYCET) 7.5-325 mg/15 ml solution Take 15 mLs by mouth at bedtime as needed for moderate pain. 120 mL 0  . metoprolol tartrate (LOPRESSOR) 25 MG tablet Take 1 tablet (25 mg total) by mouth 2 (two) times daily. 180 tablet 3  . metoprolol tartrate (LOPRESSOR) 25 MG tablet TAKE 1 TABLET (25 MG TOTAL) BY MOUTH 2 (TWO) TIMES DAILY. 60 tablet 5  . multivitamin (THERAGRAN) tablet Take 1 tablet by mouth daily. [place]rum [place].     No current facility-administered medications on file prior to visit.   Review of Systems Constitutional: Negative for increased diaphoresis, or other activity, appetite or siginficant weight change other than noted HENT: Negative for worsening hearing loss, ear pain, facial swelling, mouth sores and neck stiffness.   Eyes: Negative for other worsening pain, redness or visual disturbance.  Respiratory: Negative for choking or stridor Cardiovascular: Negative for other  chest pain and palpitations.  Gastrointestinal: Negative for worsening diarrhea, blood in stool, or abdominal distention Genitourinary: Negative for hematuria, flank pain or change in urine volume.  Musculoskeletal: Negative for myalgias or other joint complaints.  Skin: Negative for other color change and wound or drainage.  Neurological: Negative for syncope and numbness. other than noted Hematological: Negative for adenopathy. or other swelling Psychiatric/Behavioral: Negative  for hallucinations, SI, self-injury, decreased concentration or other worsening agitation.      Objective:   Physical Exam BP 122/70 mmHg  Pulse 60  Temp(Src) 98.2 F (36.8 C) (Oral)  Resp 20  Wt 206 lb (93.441 kg)  SpO2 97% VS noted,  Constitutional: Pt is oriented to person, place, and time. Appears well-developed and well-nourished, in no significant distress Head: Normocephalic and atraumatic  Eyes: Conjunctivae and EOM are normal. Pupils are equal, round, and reactive to light Right Ear: External ear normal.  Left Ear: External ear normal Nose: Nose normal.  Mouth/Throat: Oropharynx is clear and moist  Neck: Normal range of motion. Neck supple. No JVD present. No tracheal deviation present or significant neck LA or mass Cardiovascular: Normal rate, regular rhythm, normal heart sounds and intact distal pulses.   Pulmonary/Chest: Effort normal and breath sounds without rales or wheezing  Abdominal: Soft. Bowel sounds are normal. NT. No HSM  Musculoskeletal: Normal range of motion. Exhibits no edema Lymphadenopathy: Has no cervical adenopathy.  Neurological: Pt is alert and oriented to person, place, and time. Pt has normal reflexes. No cranial nerve deficit. Motor grossly intact Skin: Skin is warm and dry. No rash noted or new ulcers Psychiatric:  Has mild nervous mood and affect. Behavior is normal. Not depressed affect    Assessment & Plan:

## 2016-05-28 NOTE — Patient Instructions (Signed)

## 2016-05-28 NOTE — Progress Notes (Signed)
Pre visit review using our clinic review tool, if applicable. No additional management support is needed unless otherwise documented below in the visit note. 

## 2016-05-30 ENCOUNTER — Other Ambulatory Visit: Payer: Self-pay | Admitting: Internal Medicine

## 2016-05-30 NOTE — Assessment & Plan Note (Signed)
stable overall by history and exam, recent data reviewed with pt, and pt to continue medical treatment as before,  to f/u any worsening symptoms or concerns Lab Results  Component Value Date   WBC 5.0 05/28/2016   HGB 11.0* 05/28/2016   HCT 33.5* 05/28/2016   PLT 208.0 05/28/2016   GLUCOSE 90 05/28/2016   CHOL 181 05/28/2016   TRIG 38.0 05/28/2016   HDL 82.30 05/28/2016   LDLCALC 91 05/28/2016   ALT 14 05/28/2016   AST 20 05/28/2016   NA 136 05/28/2016   K 4.1 05/28/2016   CL 102 05/28/2016   CREATININE 0.98 05/28/2016   BUN 17 05/28/2016   CO2 33* 05/28/2016   TSH 1.74 05/28/2016   HGBA1C 5.8 02/22/2009

## 2016-05-30 NOTE — Assessment & Plan Note (Signed)

## 2016-05-30 NOTE — Assessment & Plan Note (Signed)
Lab Results  Component Value Date   LDLCALC 91 05/28/2016   stable overall by history and exam, recent data reviewed with pt, and pt to continue medical treatment as before,  to f/u any worsening symptoms or concerns

## 2016-05-30 NOTE — Assessment & Plan Note (Signed)
stable overall by history and exam, and pt to continue medical treatment as before,  to f/u any worsening symptoms or concerns 

## 2016-05-30 NOTE — Assessment & Plan Note (Addendum)
stable overall by history and exam, recent data reviewed with pt, and pt to continue medical treatment as before,  to f/u any worsening symptoms or concerns BP Readings from Last 3 Encounters:  05/28/16 122/70  04/02/16 124/82  03/14/16 120/80   In addition to the time spent performing CPE, I spent an additional 15 minutes face to face,in which greater than 50% of this time was spent in counseling and coordination of care for patient's illness as documented.

## 2016-11-04 ENCOUNTER — Other Ambulatory Visit: Payer: Self-pay | Admitting: Internal Medicine

## 2016-11-26 ENCOUNTER — Other Ambulatory Visit: Payer: Self-pay | Admitting: Obstetrics and Gynecology

## 2016-11-26 DIAGNOSIS — N6320 Unspecified lump in the left breast, unspecified quadrant: Secondary | ICD-10-CM

## 2016-12-08 ENCOUNTER — Other Ambulatory Visit: Payer: BC Managed Care – PPO

## 2016-12-16 ENCOUNTER — Ambulatory Visit
Admission: RE | Admit: 2016-12-16 | Discharge: 2016-12-16 | Disposition: A | Discharge status: Home / Self Care | Payer: BC Managed Care – PPO | Source: Ambulatory Visit | Attending: Obstetrics and Gynecology | Admitting: Obstetrics and Gynecology

## 2016-12-16 DIAGNOSIS — N6320 Unspecified lump in the left breast, unspecified quadrant: Secondary | ICD-10-CM

## 2017-01-14 ENCOUNTER — Encounter: Payer: Self-pay | Admitting: Primary Care

## 2017-01-14 ENCOUNTER — Ambulatory Visit (INDEPENDENT_AMBULATORY_CARE_PROVIDER_SITE_OTHER): Payer: BC Managed Care – PPO | Admitting: Primary Care

## 2017-01-14 VITALS — BP 122/86 | HR 71 | Temp 98.7°F | Ht 65.0 in | Wt 185.0 lb

## 2017-01-14 DIAGNOSIS — J069 Acute upper respiratory infection, unspecified: Secondary | ICD-10-CM

## 2017-01-14 MED ORDER — HYDROCOD POLST-CPM POLST ER 10-8 MG/5ML PO SUER: 5.0000 mL | Freq: Two times a day (BID) | ORAL | 0 refills | Status: DC | PRN

## 2017-01-14 MED ORDER — HYDROCODONE-HOMATROPINE 5-1.5 MG/5ML PO SYRP: 5.0000 mL | ORAL_SOLUTION | Freq: Three times a day (TID) | ORAL | 0 refills | Status: DC | PRN

## 2017-01-14 NOTE — Progress Notes (Signed)
Subjective:    Patient ID: Katherine KiefKelly R Tucker, female    DOB: 1971-09-29, 46 y.o.   MRN: [id number]  HPI  Katherine Tucker is a 46 year old female who presents today with a chief complaint of body aches. She also reports cough, nasal congestion, sore throat, ear pain. Katherine symptoms began 4 days ago with body aches. She's taken Thera-flu with temporary improvement. She missed work for the past 2 days.   Review of Systems  Constitutional: Positive for chills and fatigue. Negative for fever.  HENT: Positive for congestion, ear pain and sinus pressure. Negative for sore throat.   Respiratory: Positive for cough. Negative for shortness of breath and wheezing.   Cardiovascular: Negative for chest pain.       Past Medical History:  Diagnosis Date  . Anxiety   . Chicken pox   . Depression   . Elevated prolactin level (HCC)    elevated in 2009. MRI brain normal  . GERD (gastroesophageal reflux disease)   . Headache(784.0)   . Hiatal hernia   . Hyperlipidemia   . Hypertension   . Mammary duct ectasia of right breast 2009   excision of duct - Dr. Davina Pokeornet 2010 - nonmalignant path  . Morbid obesity (HCC)      Social History   Social History  . Marital status: Single    Spouse name: N/A  . Number of children: N/A  . Years of education: 6119   Occupational History  . 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 Smoking status: Never Smoker  . Smokeless tobacco: Never Used  . Alcohol use No  . Drug use: No  . Sexual activity: No   Other Topics Concern  . Not on file   Social History Narrative   HSG, Completed Field seismologistUndergraduate Bachelor's, [employer]. Single. 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 Lives alone w/ 2 dogs. Remains active and in touch with Katherine sorority sisters. [place] and they are close.           Past Surgical History:    Procedure Laterality Date  . BLADDER SURGERY     childhood  . breast lesion excison Right Jan 2010   Dr. Luisa Hartornett. Ductal ectasia  . lapband procedure  5-11   [facility]  . MOUTH SURGERY      Family History  Problem Relation Age of Onset  . Arthritis Tucker   . Hypertension Tucker   . Hyperlipidemia Tucker   . Hypertension Father   . Hyperlipidemia Father   . Cancer Paternal Aunt     breast  . Diabetes Other     Allergies  Allergen Reactions  . Contrast Media [Iodinated Diagnostic Agents]     Current Outpatient Prescriptions on File Prior to Visit  Medication Sig Dispense Refill  . Calcium 1500 MG tablet Take 1,500 mg by mouth daily.      . citalopram (CELEXA) 40 MG tablet TAKE 1 TABLET (40 MG TOTAL) BY MOUTH DAILY. 90 tablet 2  . metoprolol tartrate (LOPRESSOR) 25 MG tablet TAKE 1 TABLET (25 MG TOTAL) BY MOUTH 2 (TWO) TIMES DAILY. 60 tablet 5  . multivitamin (THERAGRAN) tablet Take 1 tablet by mouth daily. Centrum [place].     No current facility-administered medications on file prior to visit.     BP 122/86   Pulse 71  Temp 98.7 F (37.1 C) (Oral)   Ht 5\' 5"  (1.651 m)   Wt 185 lb (83.9 kg)   LMP 12/24/2016   SpO2 98%   BMI 30.79 kg/m    Objective:   Physical Exam  Constitutional: She appears well-nourished. She does not have a sickly appearance. She appears ill.  HENT:  Right Ear: Tympanic membrane and ear canal normal.  Left Ear: Tympanic membrane and ear canal normal.  Nose: Right sinus exhibits no maxillary sinus tenderness and no frontal sinus tenderness. Left sinus exhibits no maxillary sinus tenderness and no frontal sinus tenderness.  Mouth/Throat: Oropharynx is clear and moist.  Bilateral TM's cloudy  Eyes: Conjunctivae are normal.  Neck: Neck supple.  Cardiovascular: Normal rate and regular rhythm.   Pulmonary/Chest: Effort normal and breath sounds normal. She has no wheezes. She has no rales.  Mild, hacking cough during exam  Lymphadenopathy:     She has no cervical adenopathy.  Skin: Skin is warm and dry.          Assessment & Plan:  URI:  Cough, congestion, fatigue, body aches x 4 days. Some improvement with OTC treatment, temporary. Exam today with clear lungs, mild cough present, vitals stable. Suspect viral involvement and will treat with supportive measures.  Rx for Tussionex provided. Discussed use of ibuprofen, Flonase. Fluids, rest, follow up PRN.  Morrie Sheldon, NP

## 2017-01-14 NOTE — Patient Instructions (Signed)
Your symptoms are representative of a viral illness which will resolve on its own over time. Our goal is to treat your symptoms in order to aid your body in the healing process and to make you more comfortable.   Cough/Congestion: Try taking Mucinex DM. This will help loosen up the mucous in your chest. Ensure you take this medication with a full glass of water.  You may take the Hycodan cough suppressant every 8 hours as needed for cough and rest. Caution this medication contains codeine and will make you feel drowsy.  Please notify me if you develop persistent fevers of 101, start coughing up green mucous, notice increased fatigue or weakness, or feel worse after 1 week of onset of symptoms.   Increase consumption of water intake and rest.  It was a pleasure meeting you!   Upper Respiratory Infection, Adult Most upper respiratory infections (URIs) are a viral infection of the air passages leading to the lungs. A URI affects the nose, throat, and upper air passages. The most common type of URI is nasopharyngitis and is typically referred to as "the common cold." URIs run their course and usually go away on their own. Most of the time, a URI does not require medical attention, but sometimes a bacterial infection in the upper airways can follow a viral infection. This is called a secondary infection. Sinus and middle ear infections are common types of secondary upper respiratory infections. Bacterial pneumonia can also complicate a URI. A URI can worsen asthma and chronic obstructive pulmonary disease (COPD). Sometimes, these complications can require emergency medical care and may be life threatening. What are the causes? Almost all URIs are caused by viruses. A virus is a type of germ and can spread from one person to another. What increases the risk? You may be at risk for a URI if:  You smoke.  You have chronic heart or lung disease.  You have a weakened defense (immune) system.  You  are very young or very old.  You have nasal allergies or asthma.  You work in crowded or poorly ventilated areas.  You work in health care facilities or schools. What are the signs or symptoms? Symptoms typically develop 2-3 days after you come in contact with a cold virus. Most viral URIs last 7-10 days. However, viral URIs from the influenza virus (flu virus) can last 14-18 days and are typically more severe. Symptoms may include:  Runny or stuffy (congested) nose.  Sneezing.  Cough.  Sore throat.  Headache.  Fatigue.  Fever.  Loss of appetite.  Pain in your forehead, behind your eyes, and over your cheekbones (sinus pain).  Muscle aches. How is this diagnosed? Your health care provider may diagnose a URI by:  Physical exam.  Tests to check that your symptoms are not due to another condition such as:  Strep throat.  Sinusitis.  Pneumonia.  Asthma. How is this treated? A URI goes away on its own with time. It cannot be cured with medicines, but medicines may be prescribed or recommended to relieve symptoms. Medicines may help:  Reduce your fever.  Reduce your cough.  Relieve nasal congestion. Follow these instructions at home:  Take medicines only as directed by your health care provider.  Gargle warm saltwater or take cough drops to comfort your throat as directed by your health care provider.  Use a warm mist humidifier or inhale steam from a shower to increase air moisture. This may make it easier to breathe.  Drink enough fluid to keep your urine clear or pale yellow.  Eat soups and other clear broths and maintain good nutrition.  Rest as needed.  Return to work when your temperature has returned to normal or as your health care provider advises. You may need to stay home longer to avoid infecting others. You can also use a face mask and careful hand washing to prevent spread of the virus.  Increase the usage of your inhaler if you have  asthma.  Do not use any tobacco products, including cigarettes, chewing tobacco, or electronic cigarettes. If you need help quitting, ask your health care provider. How is this prevented? The best way to protect yourself from getting a cold is to practice good hygiene.  Avoid oral or hand contact with people with cold symptoms.  Wash your hands often if contact occurs. There is no clear evidence that vitamin C, vitamin E, echinacea, or exercise reduces the chance of developing a cold. However, it is always recommended to get plenty of rest, exercise, and practice good nutrition. Contact a health care provider if:  You are getting worse rather than better.  Your symptoms are not controlled by medicine.  You have chills.  You have worsening shortness of breath.  You have brown or red mucus.  You have yellow or brown nasal discharge.  You have pain in your face, especially when you bend forward.  You have a fever.  You have swollen neck glands.  You have pain while swallowing.  You have white areas in the back of your throat. Get help right away if:  You have severe or persistent:  Headache.  Ear pain.  Sinus pain.  Chest pain.  You have chronic lung disease and any of the following:  Wheezing.  Prolonged cough.  Coughing up blood.  A change in your usual mucus.  You have a stiff neck.  You have changes in your:  Vision.  Hearing.  Thinking.  Mood. This information is not intended to replace advice given to you by your health care provider. Make sure you discuss any questions you have with your health care provider. Document Released: 05/19/2001 Document Revised: 07/26/2016 Document Reviewed: 02/28/2014 Elsevier Interactive Patient Education  2017 [employer].

## 2017-01-14 NOTE — Progress Notes (Signed)
Pre visit review using our clinic review tool, if applicable. No additional management support is needed unless otherwise documented below in the visit note. 

## 2017-03-25 ENCOUNTER — Other Ambulatory Visit: Payer: Self-pay | Admitting: Internal Medicine

## 2017-03-25 MED ORDER — CITALOPRAM HYDROBROMIDE 40 MG PO TABS: ORAL_TABLET | ORAL | Status: DC

## 2017-03-25 NOTE — Telephone Encounter (Signed)
Done erx  Ppt to make ROV for further refills please

## 2017-03-25 NOTE — Telephone Encounter (Signed)
Will be faxed by Julie  

## 2017-03-31 ENCOUNTER — Ambulatory Visit (INDEPENDENT_AMBULATORY_CARE_PROVIDER_SITE_OTHER): Payer: BC Managed Care – PPO | Admitting: Internal Medicine

## 2017-03-31 VITALS — BP 128/80 | HR 83 | Ht 66.0 in | Wt 174.0 lb

## 2017-03-31 DIAGNOSIS — Z0000 Encounter for general adult medical examination without abnormal findings: Secondary | ICD-10-CM

## 2017-03-31 MED ORDER — CITALOPRAM HYDROBROMIDE 40 MG PO TABS: ORAL_TABLET | ORAL | 3 refills | Status: DC

## 2017-03-31 MED ORDER — RANITIDINE HCL 150 MG PO TABS: 150.0000 mg | ORAL_TABLET | Freq: Two times a day (BID) | ORAL | 3 refills | Status: DC

## 2017-03-31 MED ORDER — METOPROLOL TARTRATE 25 MG PO TABS: ORAL_TABLET | ORAL | 3 refills | Status: DC

## 2017-03-31 NOTE — Assessment & Plan Note (Signed)

## 2017-03-31 NOTE — Progress Notes (Signed)
Subjective:    Patient ID: Katherine Tucker, female    DOB: Mar 14, 1971, 46 y.o.   MRN: [id number]  HPI  Here for wellness and f/u;  Overall doing ok;  Pt denies Chest pain, worsening SOB, DOE, wheezing, orthopnea, PND, worsening LE edema, palpitations, dizziness or syncope.  Pt denies neurological change such as new headache, facial or extremity weakness.  Pt denies polydipsia, polyuria, or low sugar symptoms. Pt states overall good compliance with treatment and medications, good tolerability, and has been trying to follow appropriate diet.  Pt denies worsening depressive symptoms, suicidal ideation or panic. No fever, night sweats, wt loss, loss of appetite, or other constitutional symptoms.  Pt states good ability with ADL's, has low fall risk, home safety reviewed and adequate, no other significant changes in hearing or vision, and only occasionally active with exercise. Lost 30 lbs with tightening of the lap band.  No new complaints. 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 Anxiety   . Chicken pox   . Depression   . Elevated prolactin level (HCC)    elevated in 2009. MRI brain normal  . GERD (gastroesophageal reflux disease)   . Headache(784.0)   . Hiatal hernia   . Hyperlipidemia   . Hypertension   . Mammary duct ectasia of right breast 2009   excision of duct - Dr. Davina Poke 2010 - nonmalignant path  . Morbid obesity (HCC)    Past Surgical History:  Procedure Laterality Date  . BLADDER SURGERY     childhood  . breast lesion excison Right Jan 2010   Dr. Luisa Hart. Ductal ectasia  . lapband procedure  5-11   [facility]  . MOUTH SURGERY      reports that she has never smoked. She has never used smokeless tobacco. She reports that she does not drink alcohol or use drugs. family history includes Arthritis in  her mother; Cancer in her paternal aunt; Diabetes in her other; Hyperlipidemia in her father and mother; Hypertension in her father and mother. Allergies  Allergen Reactions  . Contrast Media [Iodinated Diagnostic Agents]    Current Outpatient Prescriptions on File Prior to Visit  Medication Sig Dispense Refill  . Calcium 1500 MG tablet Take 1,500 mg by mouth daily.      . multivitamin (THERAGRAN) tablet Take 1 tablet by mouth daily. Centrum [place].     No current facility-administered medications on file prior to visit.    Review of Systems Constitutional: Negative for other unusual diaphoresis, sweats, appetite or weight changes HENT: Negative for other worsening hearing loss, ear pain, facial swelling, mouth sores or neck stiffness.   Eyes: Negative for other worsening pain, redness or other visual disturbance.  Respiratory: Negative for other stridor or swelling Cardiovascular: Negative for other palpitations or other chest pain  Gastrointestinal: Negative for worsening diarrhea or loose stools, blood in stool, distention or other pain Genitourinary: Negative for hematuria, flank pain or other change in urine volume.  Musculoskeletal: Negative for myalgias or other joint swelling.  Skin: Negative for other color change, or other wound or worsening drainage.  Neurological: Negative for other syncope or numbness. Hematological: Negative for other adenopathy or swelling Psychiatric/Behavioral: Negative for hallucinations, other worsening agitation, SI, self-injury, or new decreased concentration All other system neg per pt  Objective:   Physical Exam BP 128/80   Pulse 83   Ht  (1.676 m)   Wt 174 lb (78.9 kg)   SpO2 98%   BMI 28.08 kg/m  VS noted, overwt  Constitutional: Pt is oriented to person, place, and time. Appears well-developed and well-nourished, in no significant distress and comfortable Head: Normocephalic and atraumatic  Eyes: Conjunctivae and EOM are normal.  Pupils are equal, round, and reactive to light Right Ear: External ear normal without discharge Left Ear: External ear normal without discharge Nose: Nose without discharge or deformity Mouth/Throat: Oropharynx is without other ulcerations and moist  Neck: Normal range of motion. Neck supple. No JVD present. No tracheal deviation present or significant neck LA or mass Cardiovascular: Normal rate, regular rhythm, normal heart sounds and intact distal pulses.   Pulmonary/Chest: WOB normal and breath sounds without rales or wheezing  Abdominal: Soft. Bowel sounds are normal. NT. No HSM  Musculoskeletal: Normal range of motion. Exhibits no edema Lymphadenopathy: Has no other cervical adenopathy.  Neurological: Pt is alert and oriented to person, place, and time. Pt has normal reflexes. No cranial nerve deficit. Motor grossly intact, Gait intact Skin: Skin is warm and dry. No rash noted or new ulcerations Psychiatric:  Has normal mood and affect. Behavior is normal without agitation No other exam findings    Assessment & Plan:

## 2017-03-31 NOTE — Progress Notes (Signed)
Pre visit review using our clinic review tool, if applicable. No additional management support is needed unless otherwise documented below in the visit note. 

## 2017-03-31 NOTE — Patient Instructions (Signed)

## 2017-04-09 ENCOUNTER — Encounter: Payer: Self-pay | Admitting: Family Medicine

## 2017-04-09 ENCOUNTER — Ambulatory Visit (INDEPENDENT_AMBULATORY_CARE_PROVIDER_SITE_OTHER): Payer: BC Managed Care – PPO | Admitting: Family Medicine

## 2017-04-09 VITALS — BP 110/82 | HR 81 | Temp 98.7°F | Wt 178.4 lb

## 2017-04-09 DIAGNOSIS — J309 Allergic rhinitis, unspecified: Secondary | ICD-10-CM

## 2017-04-09 DIAGNOSIS — J029 Acute pharyngitis, unspecified: Secondary | ICD-10-CM | POA: Diagnosis not present

## 2017-04-09 NOTE — Progress Notes (Signed)
Subjective:    Patient ID: Katherine Tucker, female    DOB: Sep 15, 1971, 46 y.o.   MRN: [id number]  HPI This is a 46 yo female with 3 days of sinus headache, started to have sore throat and took tylenol without relief. Pain all the time. Some cough, worse at night. A little ear "irritation" a lot of post nasal drainage. Took Claritin and Claritin D, ibuprofen without relief. Some sweats at night, not sure if hot flashes.  Was recently treated with augmentin for 10 days and finished a couple of days ago. Has been off and sick since January, feels like she has not fully recovered from URI. Cough was very prolonged before finally resolving.   Has history of lap band, had tightened several months ago, continues to loose weight.   She had physical with PCP, Dr. Jonny Ruiz 03/31/17, has labs ordered, needs to have drawn.   Past Medical History:  Diagnosis Date  . Anxiety   . Chicken pox   . Depression   . Elevated prolactin level (HCC)    elevated in 2009. MRI brain normal  . GERD (gastroesophageal reflux disease)   . Headache(784.0)   . Hiatal hernia   . Hyperlipidemia   . Hypertension   . Mammary duct ectasia of right breast 2009   excision of duct - Dr. Davina Poke 2010 - nonmalignant path  . Morbid obesity (HCC)    Past Surgical History:  Procedure Laterality Date  . BLADDER SURGERY     childhood  . breast lesion excison Right Jan 2010   Dr. Luisa Hart. Ductal ectasia  . lapband procedure  5-11   [facility]  . MOUTH SURGERY     Family History  Problem Relation Age of Onset  . Arthritis Mother   . Hypertension Mother   . Hyperlipidemia Mother   . Hypertension Father   . Hyperlipidemia Father   . Cancer Paternal Aunt     breast  . Diabetes Other    Social History  Substance Use Topics  . Smoking status: Never Smoker  . Smokeless tobacco: Never Used  . Alcohol use No      Review of Systems Per HPI    Objective:   Physical Exam  Constitutional: She appears well-nourished. No  distress.  HENT:  Head: Normocephalic and atraumatic.  Right Ear: Tympanic membrane, external ear and ear canal normal.  Left Ear: Tympanic membrane, external ear and ear canal normal.  Nose: Mucosal edema and rhinorrhea present. Right sinus exhibits no maxillary sinus tenderness and no frontal sinus tenderness. Left sinus exhibits no maxillary sinus tenderness and no frontal sinus tenderness.  Mouth/Throat: Uvula is midline and mucous membranes are normal. Posterior oropharyngeal erythema present. No oropharyngeal exudate or posterior oropharyngeal edema.  Moderate amount post nasal drainage.   Skin: She is not diaphoretic.  Vitals reviewed.     BP 110/82 (BP Location: Left Arm, Patient Position: Sitting, Cuff Size: Normal)   Pulse 81   Temp 98.7 F (37.1 C) (Oral)   Wt 178 lb 6.4 oz (80.9 kg)   LMP 04/06/2017   SpO2 97%   BMI 28.79 kg/m  Wt Readings from Last 3 Encounters:  04/09/17 178 lb 6.4 oz (80.9 kg)  03/31/17 174 lb (78.9 kg)  01/14/17 185 lb (83.9 kg)   POCT rapid strep- negative    Assessment & Plan:  1. Sore throat - POCT rapid strep A- negative - likely related to post nasal drainage, recently completed course of augmentin - recommended OTC  analgesics, Afrin nasal spray, warm salt water gargles  2. Allergic rhinitis, unspecified seasonality, unspecified trigger - try switching to cetirizine, add Afrin, increase fluids  - encouraged her to schedule lab visit at PCP- she will do this next week  Olean Reeeborah Allianna Beaubien, FNP-BC  [facility] [place], [facility]  04/12/2017 8:50 AM

## 2017-04-09 NOTE — Patient Instructions (Signed)
Try Afrin twice a day for max of thee days  Switch Claritin to Zyrtec (cetirizine)  Chloraseptic spray for throat  Increase fluids to keep urine light yellow   Take ibuprofen 2-3 times a day at least for next 2 days

## 2017-04-10 ENCOUNTER — Ambulatory Visit: Payer: Self-pay | Admitting: Family Medicine

## 2017-04-11 ENCOUNTER — Other Ambulatory Visit: Payer: Self-pay | Admitting: Internal Medicine

## 2017-04-12 ENCOUNTER — Encounter: Payer: Self-pay | Admitting: Family Medicine

## 2017-04-12 LAB — POCT RAPID STREP A (OFFICE): RAPID STREP A SCREEN: NEGATIVE

## 2017-04-26 ENCOUNTER — Encounter: Payer: Self-pay | Admitting: Family Medicine

## 2017-04-26 ENCOUNTER — Ambulatory Visit (INDEPENDENT_AMBULATORY_CARE_PROVIDER_SITE_OTHER): Payer: BC Managed Care – PPO | Admitting: Family Medicine

## 2017-04-26 VITALS — BP 122/82 | HR 83 | Temp 99.1°F | Ht 66.0 in | Wt 170.1 lb

## 2017-04-26 DIAGNOSIS — J329 Chronic sinusitis, unspecified: Secondary | ICD-10-CM | POA: Diagnosis not present

## 2017-04-26 DIAGNOSIS — H6983 Other specified disorders of Eustachian tube, bilateral: Secondary | ICD-10-CM | POA: Diagnosis not present

## 2017-04-26 MED ORDER — DOXYCYCLINE HYCLATE 100 MG PO TABS: 100.0000 mg | ORAL_TABLET | Freq: Two times a day (BID) | ORAL | 0 refills | Status: DC

## 2017-04-26 MED ORDER — BENZONATATE 100 MG PO CAPS: 100.0000 mg | ORAL_CAPSULE | Freq: Two times a day (BID) | ORAL | 0 refills | Status: DC | PRN

## 2017-04-26 NOTE — Patient Instructions (Addendum)
Please take the antibiotic as instructed.  Tessalon as needed for cough.  Flonase 2 sprays each nostril daily for 1 month.  I hope you are feeling better soon! Seek care immediately if worsening, new concerns or you are not improving with treatment.

## 2017-04-26 NOTE — Progress Notes (Signed)
HPI:  Acute visit for URI: -started: 2 weeks ago and worsening -symptoms:nasal congestion, sore throat, cough, PND bilat ear pressure with R ear pain, sinus pressure, low grade temp, body aches -denies:fever, SOB, NVD, tooth pain -has tried: "everything" tylenol, ibuprofen , afrin, antihistamine -sick contacts/travel/risks: no reported flu, strep or tick exposure -Hx of: allergies  ROS: See pertinent positives and negatives per HPI.  Past Medical History:  Diagnosis Date  . Anxiety   . Chicken pox   . Depression   . Elevated prolactin level (HCC)    elevated in 2009. MRI brain normal  . GERD (gastroesophageal reflux disease)   . Headache(784.0)   . Hiatal hernia   . Hyperlipidemia   . Hypertension   . Mammary duct ectasia of right breast 2009   excision of duct - Dr. Davina Tucker 2010 - nonmalignant path  . Morbid obesity (HCC)     Past Surgical History:  Procedure Laterality Date  . BLADDER SURGERY     childhood  . breast lesion excison Right Jan 2010   Dr. Luisa Tucker. Ductal ectasia  . lapband procedure  5-11   [facility]  . MOUTH SURGERY      Family History  Problem Relation Age of Onset  . Arthritis Mother   . Hypertension Mother   . Hyperlipidemia Mother   . Hypertension Father   . Hyperlipidemia Father   . Cancer Paternal Aunt        breast  . Diabetes Other     Social History   Social History  . Marital status: Single    Spouse name: N/A  . Number of children: N/A  . Years of education: 1519   Occupational History  . 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 [employer] [place]    Social History Main Topics  . Smoking status: Never Smoker  . Smokeless tobacco: Never Used  . Alcohol use No  . Drug use: No  . Sexual activity: No   Other Topics Concern  . None   Social History Narrative   HSG, Completed Field seismologistUndergraduate Bachelor's, [employer]. Single. Work - MudloggerAssistant District Attorney - [place]. Lives alone w/ 2 dogs. Remains active and in touch with her sorority sisters. Her mother lives in [place] and they are close.            Current Outpatient Prescriptions:  .  Calcium 1500 MG tablet, Take 1,500 mg by mouth daily.  , Disp: , Rfl:  .  citalopram (CELEXA) 40 MG tablet, TAKE 1 TABLET (40 MG TOTAL) BY MOUTH DAILY., Disp: 90 tablet, Rfl: 3 .  metoprolol tartrate (LOPRESSOR) 25 MG tablet, TAKE 1 TABLET (25 MG TOTAL) BY MOUTH 2 (TWO) TIMES DAILY., Disp: 180 tablet, Rfl: 3 .  metoprolol tartrate (LOPRESSOR) 25 MG tablet, TAKE 1 TABLET (25 MG TOTAL) BY MOUTH 2 (TWO) TIMES DAILY., Disp: 180 tablet, Rfl: 3 .  multivitamin (THERAGRAN) tablet, Take 1 tablet by mouth daily. Centrum Chew., Disp: , Rfl:  .  ranitidine (ZANTAC) 150 MG tablet, Take 1 tablet (150 mg total) by mouth 2 (two) times daily., Disp: 180 tablet, Rfl: 3 .  benzonatate (TESSALON) 100 MG capsule, Take 1 capsule (100 mg total) by mouth 2 (two) times daily as needed for cough., Disp: 20 capsule, Rfl: 0 .  doxycycline (VIBRA-TABS) 100 MG tablet, Take 1 tablet (100 mg total) by mouth 2 (two) times daily., Disp: 14 tablet, Rfl: 0  EXAM:  Vitals:   04/26/17 1550  BP: 122/82  Pulse: 83  Temp: 99.1 F (37.3 C)    Body mass index is 27.45 kg/m.  GENERAL: vitals reviewed and listed above, alert, oriented, appears well hydrated and in no acute distress  HEENT: atraumatic, conjunttiva clear, no obvious abnormalities on inspection of external nose and ears, normal appearance of ear canals and TMs except for bilat clear effusion, thick nasal congestion, mild post oropharyngeal erythema with PND, no tonsillar edema or exudate, no sinus TTP  NECK: no obvious masses on inspection  LUNGS: clear to auscultation bilaterally, no wheezes, rales or rhonchi, good air movement  CV: HRRR, no peripheral edema  MS: moves all extremities without noticeable abnormality  PSYCH: pleasant and cooperative, no obvious depression  or anxiety  ASSESSMENT AND PLAN:  Discussed the following assessment and plan:  Rhinosinusitis  Dysfunction of both eustachian tubes  -we discussed possible serious and likely etiologies, workup and treatment, treatment risks and return precautions and query sinusitis vs other resp infection, viral illness or allergies -after this discussion, Katherine Tucker opted for abx given not improving over several weeks, tessalon, INS, warned of overuse risks with afrin and advise not to use longer then 3 days -follow up advised w/ PCP if worsening or not improving with treatment -of course, we advised Katherine Tucker  to return or notify a doctor immediately if symptoms worsen or persist or new concerns arise.     Patient Instructions  Please take the antibiotic as instructed.  Tessalon as needed for cough.  Flonase 2 sprays each nostril daily for 1 month.  I hope you are feeling better soon! Seek care immediately if worsening, new concerns or you are not improving with treatment.     Katherine Tucker R., DO

## 2017-05-13 ENCOUNTER — Encounter: Payer: Self-pay | Admitting: Internal Medicine

## 2017-05-13 ENCOUNTER — Ambulatory Visit (INDEPENDENT_AMBULATORY_CARE_PROVIDER_SITE_OTHER): Payer: BC Managed Care – PPO | Admitting: Internal Medicine

## 2017-05-13 ENCOUNTER — Ambulatory Visit (INDEPENDENT_AMBULATORY_CARE_PROVIDER_SITE_OTHER)
Admission: RE | Admit: 2017-05-13 | Discharge: 2017-05-13 | Disposition: A | Discharge status: Home / Self Care | Payer: BC Managed Care – PPO | Source: Ambulatory Visit | Attending: Internal Medicine | Admitting: Internal Medicine

## 2017-05-13 ENCOUNTER — Telehealth: Payer: Self-pay

## 2017-05-13 ENCOUNTER — Other Ambulatory Visit (INDEPENDENT_AMBULATORY_CARE_PROVIDER_SITE_OTHER): Payer: BC Managed Care – PPO

## 2017-05-13 VITALS — BP 134/88 | HR 87 | Ht 66.0 in | Wt 161.0 lb

## 2017-05-13 DIAGNOSIS — R05 Cough: Secondary | ICD-10-CM | POA: Diagnosis not present

## 2017-05-13 DIAGNOSIS — R5383 Other fatigue: Secondary | ICD-10-CM

## 2017-05-13 DIAGNOSIS — R059 Cough, unspecified: Secondary | ICD-10-CM

## 2017-05-13 DIAGNOSIS — R509 Fever, unspecified: Secondary | ICD-10-CM | POA: Diagnosis not present

## 2017-05-13 DIAGNOSIS — Z0000 Encounter for general adult medical examination without abnormal findings: Secondary | ICD-10-CM | POA: Diagnosis not present

## 2017-05-13 DIAGNOSIS — J309 Allergic rhinitis, unspecified: Secondary | ICD-10-CM

## 2017-05-13 DIAGNOSIS — R634 Abnormal weight loss: Secondary | ICD-10-CM

## 2017-05-13 DIAGNOSIS — D649 Anemia, unspecified: Secondary | ICD-10-CM | POA: Diagnosis not present

## 2017-05-13 LAB — BASIC METABOLIC PANEL
BUN: 10 mg/dL (ref 6–23)
CALCIUM: 10 mg/dL (ref 8.4–10.5)
CHLORIDE: 102 meq/L (ref 96–112)
CO2: 28 meq/L (ref 19–32)
CREATININE: 0.91 mg/dL (ref 0.40–1.20)
GFR: 86.52 mL/min (ref >60.00)
GLUCOSE: 104 mg/dL — AB (ref 70–99)
Potassium: 3.3 mEq/L — ABNORMAL LOW (ref 3.5–5.1)
SODIUM: 136 meq/L (ref 135–145)

## 2017-05-13 LAB — URINALYSIS, ROUTINE W REFLEX MICROSCOPIC
Hgb urine dipstick: NEGATIVE
LEUKOCYTES UA: NEGATIVE
Nitrite: NEGATIVE
PH: 6 (ref 5.0–8.0)
RBC / HPF: NONE SEEN (ref 0–?)
URINE GLUCOSE: NEGATIVE
UROBILINOGEN UA: 0.2 (ref 0.0–1.0)

## 2017-05-13 LAB — CBC WITH DIFFERENTIAL/PLATELET
BASOS ABS: 0 10*3/uL (ref 0.0–0.1)
Basophils Relative: 0.5 % (ref 0.0–3.0)
EOS ABS: 0.1 10*3/uL (ref 0.0–0.7)
Eosinophils Relative: 1 % (ref 0.0–5.0)
HCT: 28.7 % — ABNORMAL LOW (ref 36.0–46.0)
Hemoglobin: 9.1 g/dL — ABNORMAL LOW (ref 12.0–15.0)
LYMPHS ABS: 1.3 10*3/uL (ref 0.7–4.0)
Lymphocytes Relative: 18.4 % (ref 12.0–46.0)
MCHC: 31.7 g/dL (ref 30.0–36.0)
MONOS PCT: 9.4 % (ref 3.0–12.0)
Monocytes Absolute: 0.7 10*3/uL (ref 0.1–1.0)
NEUTROS ABS: 5 10*3/uL (ref 1.4–7.7)
NEUTROS PCT: 70.7 % (ref 43.0–77.0)
PLATELETS: 338 10*3/uL (ref 150.0–400.0)
RBC: 4.18 Mil/uL (ref 3.87–5.11)
RDW: 18.9 % — ABNORMAL HIGH (ref 11.5–15.5)
WBC: 7 10*3/uL (ref 4.0–10.5)

## 2017-05-13 LAB — HEPATIC FUNCTION PANEL
ALBUMIN: 3.9 g/dL (ref 3.5–5.2)
ALK PHOS: 57 U/L (ref 39–117)
ALT: 10 U/L (ref 0–35)
AST: 15 U/L (ref 0–37)
BILIRUBIN DIRECT: 0.1 mg/dL (ref 0.0–0.3)
TOTAL PROTEIN: 7.7 g/dL (ref 6.0–8.3)
Total Bilirubin: 0.3 mg/dL (ref 0.2–1.2)

## 2017-05-13 LAB — LIPID PANEL
CHOLESTEROL: 156 mg/dL (ref 0–200)
HDL: 64.9 mg/dL (ref >39.00)
LDL Cholesterol: 80 mg/dL (ref 0–99)
NonHDL: 91.15
Total CHOL/HDL Ratio: 2
Triglycerides: 55 mg/dL (ref 0.0–149.0)
VLDL: 11 mg/dL (ref 0.0–40.0)

## 2017-05-13 LAB — IBC PANEL
IRON: 10 ug/dL — AB (ref 42–145)
Saturation Ratios: 2.6 % — ABNORMAL LOW (ref 20.0–50.0)
TRANSFERRIN: 274 mg/dL (ref 212.0–360.0)

## 2017-05-13 LAB — CORTISOL: Cortisol, Plasma: 5.9 ug/dL

## 2017-05-13 LAB — TSH: TSH: 0.83 u[IU]/mL (ref 0.35–4.50)

## 2017-05-13 LAB — VITAMIN B12: VITAMIN B 12: 906 pg/mL (ref 211–911)

## 2017-05-13 LAB — CK: Total CK: 69 U/L (ref 7–177)

## 2017-05-13 LAB — C-REACTIVE PROTEIN: CRP: 1 mg/dL (ref 0.5–20.0)

## 2017-05-13 LAB — VITAMIN D 25 HYDROXY (VIT D DEFICIENCY, FRACTURES): VITD: 17.31 ng/mL — AB (ref 30.00–100.00)

## 2017-05-13 LAB — SEDIMENTATION RATE: SED RATE: 73 mm/h — AB (ref 0–20)

## 2017-05-13 MED ORDER — TRIAMCINOLONE ACETONIDE 55 MCG/ACT NA AERO: 2.0000 | INHALATION_SPRAY | Freq: Every day | NASAL | 12 refills | Status: DC

## 2017-05-13 MED ORDER — METHYLPREDNISOLONE ACETATE 80 MG/ML IJ SUSP
80.0000 mg | Freq: Once | INTRAMUSCULAR | Status: AC
  Administered 2017-05-13: 80 mg via INTRAMUSCULAR

## 2017-05-13 NOTE — Assessment & Plan Note (Addendum)
Difficult hx, etiology not clear, exam unrevealing, for cxr,  to f/u any worsening symptoms or concerns  Note:  Total time for pt hx, exam, review of record with pt in the room, determination of diagnoses and plan for further eval and tx is > 40 min, with over 50% spent in coordination and counseling of patient with respect to differential dx, and further evaluation and tx of cough, fatigue, wt loss, fever and allergies

## 2017-05-13 NOTE — Telephone Encounter (Signed)
-----   Message from Corwin LevinsJames W John, MD sent at 05/13/2017  4:34 PM EDT ----- Left message on MyChart, pt to cont same tx except  The test results show that your current treatment is OK, except there is the surprise finding of food still in the esophagus.  This is not normal and most likely is a result of your Lap Banding, which appears to too "tight".  This problem can also cause the vomiting and I believe the "congestion" with which you were concerned.  We need to refer you to a Bariatric Surgeon asap as this mechanical problem will not likely improve on its own.  I will have the office call you to find out if you have a preference of Bariatric Surgeon.  Shirron to please inform pt, and please let me know if pt has Surgeon preference

## 2017-05-13 NOTE — Assessment & Plan Note (Signed)
Etiology unclear, suspect secondary to current predicament,  to f/u any worsening symptoms or concerns

## 2017-05-13 NOTE — Assessment & Plan Note (Signed)
Etiology uncleaer, consider GI referral or bariatric surgury with hx of lap band

## 2017-05-13 NOTE — Telephone Encounter (Signed)
  Lab results per JJ in result note:  The test results show that your current treatment is OK, except the Vit D is low, and Iron is low and this making the iron deficiency Anemia as well. The potassium is slightly low as well. This is all most likely a effect of the persistent vomiting and change in your ability to tolerate your diet in the past month. Ideally, you would want to start Vit D 2000 units per day, and iron sulfate OTC 325 mg per day, but this may not work too well until the "tight" Lap band is addressed (but ok to try).    There is no other need for change of treatment or further evaluation based on these results, at this time.    There are several blood tests still pending

## 2017-05-13 NOTE — Assessment & Plan Note (Signed)
Mild to mod, for depomedrol IM 80, nasacort asd,  to f/u any worsening symptoms or concerns

## 2017-05-13 NOTE — Assessment & Plan Note (Signed)
Etiology unclear, for cxr, ua and labs as documented

## 2017-05-13 NOTE — Patient Instructions (Addendum)
You had the steroid shot today  Please take all new medication as prescribed - the Nasacort  Please continue all other medications as before, and refills have been done if requested.  Please have the pharmacy call with any other refills you may need.  Please keep your appointments with your specialists as you may have planned  Please go to the XRAY Department in the Basement (go straight as you get off the elevator) for the x-ray testing  Please go to the LAB in the Basement (turn left off the elevator) for the tests to be done today  You will be contacted by phone if any changes need to be made immediately.  Otherwise, you will receive a letter about your results with an explanation, but please check with MyChart first  Please remember to sign up for MyChart if you have not done so, as this will be important to you in the future with finding out test results, communicating by private email, and scheduling acute appointments online when needed.

## 2017-05-13 NOTE — Progress Notes (Signed)
Subjective:    Patient ID: Katherine Tucker, female    DOB: [birth year], 46 y.o.   MRN: [id number]  HPI    Here with vague history of 1 mo "going through hell" , finally culminating in not being able to work this wk; C/o significant "congestion" form the nose and mouth.   Mucinex since mon has helped but felt unable to work.  Has been staying with her mother, who suggests a cxr due to the coughing.   Pt denies fever, night sweats, or other constitutional symptoms but has had low appetite and wt loss. Has low appetite, lost 15 lbs recently without trying  Has tried Claritin, zyrtec and theraflu but not much help  Still with vomiting mucous in the middle of the night every night for 1 month. Has been feelin warm and feverish at times, but no chills Wt Readings from Last 3 Encounters:  05/13/17 161 lb (73 kg)  04/26/17 170 lb 1.6 oz (77.2 kg)  04/09/17 178 lb 6.4 oz (80.9 kg)  Deos admit to worsening depression in the past month but no Si or HI.  Just wants the "full physical exam" done exam.  Denies worsening reflux, abd pain,  bowel change or blood. Has appt tomorrow wit her monthly counseling, declines psychiatry med change Past Medical History:  Diagnosis Date  . Anxiety   . Chicken pox   . Depression   . Elevated prolactin level (HCC)    elevated in 2009. MRI brain normal  . GERD (gastroesophageal reflux disease)   . Headache(784.0)   . Hiatal hernia   . Hyperlipidemia   . Hypertension   . Mammary duct ectasia of right breast 2009   excision of duct - Dr. Davina Poke 2010 - nonmalignant path  . Morbid obesity (HCC)    Past Surgical History:  Procedure Laterality Date  . BLADDER SURGERY     childhood  . breast lesion excison Right Jan 2010   Dr. Luisa Hart. Ductal ectasia  . lapband procedure  5-11   [facility]  . MOUTH SURGERY      reports that she has never smoked. She has never used smokeless tobacco. She reports that she does not drink alcohol or use drugs. family history includes  Arthritis in her mother; Cancer in her paternal aunt; Diabetes in her other; Hyperlipidemia in her father and mother; Hypertension in her father and mother. Allergies  Allergen Reactions  . Contrast Media [Iodinated Diagnostic Agents]    Current Outpatient Prescriptions on File Prior to Visit  Medication Sig Dispense Refill  . Calcium 1500 MG tablet Take 1,500 mg by mouth daily.      . citalopram (CELEXA) 40 MG tablet TAKE 1 TABLET (40 MG TOTAL) BY MOUTH DAILY. 90 tablet 3  . metoprolol tartrate (LOPRESSOR) 25 MG tablet TAKE 1 TABLET (25 MG TOTAL) BY MOUTH 2 (TWO) TIMES DAILY. 180 tablet 3  . multivitamin (THERAGRAN) tablet Take 1 tablet by mouth daily. Centrum [place].    . ranitidine (ZANTAC) 150 MG tablet Take 1 tablet (150 mg total) by mouth 2 (two) times daily. 180 tablet 3   No current facility-administered medications on file prior to visit.    Review of Systems  Constitutional: Negative for other unusual diaphoresis or sweats HENT: Negative for ear discharge or swelling Eyes: Negative for other worsening visual disturbances Respiratory: Negative for stridor or other swelling  Gastrointestinal: Negative for worsening distension or other blood Genitourinary: Negative for retention or other urinary change Musculoskeletal: Negative for other  MSK pain or swelling Skin: Negative for color change or other new lesions Neurological: Negative for worsening tremors and other numbness  Psychiatric/Behavioral: Negative for worsening agitation or other fatigue All other system neg per pt    Objective:   Physical Exam BP 134/88   Pulse 87   Ht 5\' 6"  (1.676 m)   Wt 161 lb (73 kg)   SpO2 100%   BMI 25.99 kg/m  VS noted,  Constitutional: Pt appears in NAD HENT: Head: NCAT.  Right Ear: External ear normal.  Left Ear: External ear normal.  Eyes: . Pupils are equal, round, and reactive to light. Conjunctivae and EOM are normal Nose: without d/c or deformity Neck: Neck supple. Gross normal  ROM Cardiovascular: Normal rate and regular rhythm.   Pulmonary/Chest: Effort normal and breath sounds without rales or wheezing.  Abd:  Soft, NT, ND, + BS, no organomegaly Neurological: Pt is alert. At baseline orientation, motor grossly intact Skin: Skin is warm. No rashes, other new lesions, no LE edema Psychiatric: Pt behavior is normal without agitation  No other exam findings  Lab Results  Component Value Date   WBC 5.0 05/28/2016   HGB 11.0 (L) 05/28/2016   HCT 33.5 (L) 05/28/2016   PLT 208.0 05/28/2016   GLUCOSE 90 05/28/2016   CHOL 181 05/28/2016   TRIG 38.0 05/28/2016   HDL 82.30 05/28/2016   LDLCALC 91 05/28/2016   ALT 14 05/28/2016   AST 20 05/28/2016   NA 136 05/28/2016   K 4.1 05/28/2016   CL 102 05/28/2016   CREATININE 0.98 05/28/2016   BUN 17 05/28/2016   CO2 33 (H) 05/28/2016   TSH 1.74 05/28/2016   HGBA1C 5.8 02/22/2009       Assessment & Plan:

## 2017-05-13 NOTE — Telephone Encounter (Signed)
Called pt, LVM.   

## 2017-05-14 LAB — EPSTEIN-BARR VIRUS VCA ANTIBODY PANEL
EBV VCA IGG: 705 U/mL — AB
EBV VCA IgM: <36 U/mL

## 2017-05-14 LAB — LYME AB/WESTERN BLOT REFLEX

## 2017-05-14 LAB — HIV ANTIBODY (ROUTINE TESTING W REFLEX): HIV 1&2 Ab, 4th Generation: NONREACTIVE

## 2017-05-14 LAB — CMV ABS, IGG+IGM (CYTOMEGALOVIRUS): CMV IgM: <30 AU/mL (ref <30.00)

## 2017-05-16 LAB — EHRLICHIA ANTIBODY PANEL: E chaffeensis (HGE) Ab, IgG: <1:64 {titer}

## 2017-05-17 LAB — ROCKY MTN SPOTTED FVR ABS PNL(IGG+IGM)
RMSF IGG: NOT DETECTED
RMSF IGM: NOT DETECTED

## 2017-05-17 LAB — FOOD ALLERGY PROFILE
Allergen, Salmon, f41: <0.1 kU/L
Almonds: <0.1 kU/L
Egg White IgE: <0.1 kU/L
Peanut IgE: <0.1 kU/L
Scallop IgE: <0.1 kU/L
Sesame Seed f10: <0.1 kU/L
Shrimp IgE: <0.1 kU/L
Soybean IgE: <0.1 kU/L
Tuna IgE: <0.1 kU/L

## 2017-05-17 LAB — ALLERGEN, JOHNSON GRASS, G10

## 2017-05-17 LAB — ALLERGEN, A. ALTERNATA, M6: Allergen, A. alternata, m6: <0.1 kU/L

## 2017-05-17 LAB — T007-IGE OAK, WHITE

## 2017-05-17 LAB — ALLERGEN,MEADOW GRASS(KY) G8

## 2017-05-17 LAB — ALLERGEN, WALNUT, T10

## 2017-05-17 LAB — ALLERGEN, COTTONWOOD, T14: Allergen, Cottonwood, t14: <0.1 kU/L

## 2017-05-17 LAB — ALLERGEN, ELM, T8

## 2017-05-17 LAB — ALLERGEN, BOX ELDER, T1: Box Elder IgE: <0.1 kU/L

## 2017-05-17 LAB — ALLERGEN, COMMON RAGWEED, W1

## 2017-05-17 LAB — ALLERGEN, BERMUDA GRASS, G2

## 2017-05-18 LAB — PROTEIN ELECTROPHORESIS, SERUM
ALPHA-2-GLOBULIN: 0.9 g/dL (ref 0.5–0.9)
Albumin ELP: 3.4 g/dL — ABNORMAL LOW (ref 3.8–4.8)
Alpha-1-Globulin: 0.5 g/dL — ABNORMAL HIGH (ref 0.2–0.3)
BETA 2: 0.4 g/dL (ref 0.2–0.5)
Beta Globulin: 0.4 g/dL (ref 0.4–0.6)
GAMMA GLOBULIN: 1.4 g/dL (ref 0.8–1.7)
Total Protein, Serum Electrophoresis: 7 g/dL (ref 6.1–8.1)

## 2017-05-28 ENCOUNTER — Other Ambulatory Visit: Payer: Self-pay | Admitting: General Surgery

## 2017-05-28 ENCOUNTER — Ambulatory Visit: Payer: Self-pay | Admitting: General Surgery

## 2017-05-28 DIAGNOSIS — Z9884 Bariatric surgery status: Secondary | ICD-10-CM

## 2017-05-31 ENCOUNTER — Ambulatory Visit
Admission: RE | Admit: 2017-05-31 | Discharge: 2017-05-31 | Disposition: A | Discharge status: Home / Self Care | Payer: BC Managed Care – PPO | Source: Ambulatory Visit | Attending: General Surgery | Admitting: General Surgery

## 2017-05-31 DIAGNOSIS — Z9884 Bariatric surgery status: Secondary | ICD-10-CM

## 2017-11-04 ENCOUNTER — Encounter: Payer: Self-pay | Admitting: Internal Medicine

## 2017-11-04 ENCOUNTER — Ambulatory Visit: Payer: BC Managed Care – PPO | Admitting: Internal Medicine

## 2017-11-04 VITALS — BP 110/76 | HR 82 | Temp 98.2°F | Ht 66.0 in | Wt 186.0 lb

## 2017-11-04 DIAGNOSIS — K219 Gastro-esophageal reflux disease without esophagitis: Secondary | ICD-10-CM | POA: Diagnosis not present

## 2017-11-04 DIAGNOSIS — Z23 Encounter for immunization: Secondary | ICD-10-CM | POA: Diagnosis not present

## 2017-11-04 DIAGNOSIS — R55 Syncope and collapse: Secondary | ICD-10-CM

## 2017-11-04 DIAGNOSIS — I10 Essential (primary) hypertension: Secondary | ICD-10-CM | POA: Diagnosis not present

## 2017-11-04 NOTE — Progress Notes (Signed)
Subjective:    Patient ID: Katherine Tucker, female    DOB: [birth year], 46 y.o.   MRN: [id number]  HPI  Here to f/u after 2 episodes syncope, the last witnessed by family who asks her to present today.  Has hx of recurrent syncope for about 8 yrs, and has been thought in the past to be vasovagal b/c is s/p gastric bypass surgury and has recurring nausea just prior to each episode, except for 1 episode that occurred a few years ago with straining at stool. Nausea usually happens when she overeats , such as recent holiday meals.  Also beniding forward just prior has seemed to make the episodes occur when having nausea as well  No siezure activity witnessed.  Did have loosening of the gastric band per surgury recently after abnormal UGI series in June 2018.  Last EGD was Nov 2016.  Denies worsening reflux, abd pain, dysphagia, other bowel change or blood.  Has fallen with minor bumps and scratches sometimes, and has passed out something like 15-20 times over last 8 yrs.  No prior cardiac evaluation. No recent overt abnormal bleeding or blood loss, and o/w taking po as usual.Pt denies chest pain, increased sob or doe, wheezing, orthopnea, PND, increased LE swelling, palpitations, dizziness or syncope.  Pt denies new neurological symptoms such as new headache, or facial or extremity weakness or numbness  Past Medical History:  Diagnosis Date  . Anxiety   . Chicken pox   . Depression   . Elevated prolactin level (HCC)    elevated in 2009. MRI brain normal  . GERD (gastroesophageal reflux disease)   . Headache(784.0)   . Hiatal hernia   . Hyperlipidemia   . Hypertension   . Mammary duct ectasia of right breast 2009   excision of duct - Dr. Davina Pokeornet 2010 - nonmalignant path  . Morbid obesity (HCC)    Past Surgical History:  Procedure Laterality Date  . BLADDER SURGERY     childhood  . breast lesion excison Right Jan 2010   Dr. Luisa [facility]. Ductal ectasia  . lapband procedure  5-11   [facility]  .  MOUTH SURGERY      reports that  has never smoked. she has never used smokeless tobacco. She reports that she does not drink alcohol or use drugs. family history includes Arthritis in her mother; Cancer in her paternal aunt; Diabetes in her other; Hyperlipidemia in her father and mother; Hypertension in her father and mother. Allergies  Allergen Reactions  . Contrast Media [Iodinated Diagnostic Agents]    Current Outpatient Medications on File Prior to Visit  Medication Sig Dispense Refill  . Calcium 1500 MG tablet Take 1,500 mg by mouth daily.      . citalopram (CELEXA) 40 MG tablet TAKE 1 TABLET (40 MG TOTAL) BY MOUTH DAILY. 90 tablet 3  . metoprolol tartrate (LOPRESSOR) 25 MG tablet TAKE 1 TABLET (25 MG TOTAL) BY MOUTH 2 (TWO) TIMES DAILY. 180 tablet 3  . multivitamin (THERAGRAN) tablet Take 1 tablet by mouth daily. Centrum [place].    . ranitidine (ZANTAC) 150 MG tablet Take 1 tablet (150 mg total) by mouth 2 (two) times daily. 180 tablet 3   No current facility-administered medications on file prior to visit.    Review of Systems  Constitutional: Negative for other unusual diaphoresis or sweats HENT: Negative for ear discharge or swelling Eyes: Negative for other worsening visual disturbances Respiratory: Negative for stridor or other swelling  Gastrointestinal: Negative for worsening distension  or other blood Genitourinary: Negative for retention or other urinary change Musculoskeletal: Negative for other MSK pain or swelling Skin: Negative for color change or other new lesions Neurological: Negative for worsening tremors and other numbness  Psychiatric/Behavioral: Negative for worsening agitation or other fatigue All other system neg per pt    Objective:   Physical Exam BP 110/76   Pulse 82   Temp 98.2 F (36.8 C) (Oral)   Ht 5\' 6"  (1.676 m)   Wt 186 lb (84.4 kg)   SpO2 99%   BMI 30.02 kg/m  VS noted,  Constitutional: Pt appears in NAD HENT: Head: NCAT.  Right Ear:  External ear normal.  Left Ear: External ear normal.  Eyes: . Pupils are equal, round, and reactive to light. Conjunctivae and EOM are normal Nose: without d/c or deformity Neck: Neck supple. Gross normal ROM Cardiovascular: Normal rate and regular rhythm.   Pulmonary/Chest: Effort normal and breath sounds without rales or wheezing.  Abd:  Soft, NT, ND, + BS, no organomegaly Neurological: Pt is alert. At baseline orientation, motor grossly intact Skin: Skin is warm. No rashes, other new lesions, no LE edema Psychiatric: Pt behavior is normal without agitation  No other exam findings Lab Results  Component Value Date   WBC 7.0 05/13/2017   HGB 9.1 (L) 05/13/2017   HCT 28.7 (L) 05/13/2017   PLT 338.0 05/13/2017   GLUCOSE 104 (H) 05/13/2017   CHOL 156 05/13/2017   TRIG 55.0 05/13/2017   HDL 64.90 05/13/2017   LDLCALC 80 05/13/2017   ALT 10 05/13/2017   AST 15 05/13/2017   NA 136 05/13/2017   K 3.3 (L) 05/13/2017   CL 102 05/13/2017   CREATININE 0.91 05/13/2017   BUN 10 05/13/2017   CO2 28 05/13/2017   TSH 0.83 05/13/2017   HGBA1C 5.8 02/22/2009       Assessment & Plan:

## 2017-11-04 NOTE — Assessment & Plan Note (Signed)
Stable, pt declines GI evaluation for now

## 2017-11-04 NOTE — Patient Instructions (Signed)
We are unable to do the EKG today due to machine not available  Please continue all other medications as before, and refills have been done if requested.  Please have the pharmacy call with any other refills you may need.  Please continue your efforts at being more active, low cholesterol diet, and weight control.  Please keep your appointments with your specialists as you may have planned  You will be contacted regarding the referral for: MRI brain, carotid testing, and echocardiogram  Please go to the LAB in the Basement (turn left off the elevator) for the tests to be done today  You will be contacted by phone if any changes need to be made immediately.  Otherwise, you will receive a letter about your results with an explanation, but please check with MyChart first.  Please remember to sign up for MyChart if you have not done so, as this will be important to you in the future with finding out test results, communicating by private email, and scheduling acute appointments online when needed.

## 2017-11-04 NOTE — Assessment & Plan Note (Signed)
BP Readings from Last 3 Encounters:  11/04/17 110/76  05/13/17 134/88  04/26/17 122/82  stable overall by history and exam, recent data reviewed with pt, and pt to continue medical treatment as before,  to f/u any worsening symptoms or concerns

## 2017-11-04 NOTE — Assessment & Plan Note (Signed)
Most likely vagal, but has not had other evaluation, unfort we are unable for ecg at this time, but will ask pt to return for this; also for MRI head, carotids and echocardiogram.  Consider cardiac or neuro referrals.  Also for labs as ordered today

## 2017-11-11 ENCOUNTER — Ambulatory Visit
Admission: RE | Admit: 2017-11-11 | Discharge: 2017-11-11 | Disposition: A | Discharge status: Home / Self Care | Payer: BC Managed Care – PPO | Source: Ambulatory Visit | Attending: Internal Medicine | Admitting: Internal Medicine

## 2017-11-11 ENCOUNTER — Other Ambulatory Visit: Payer: Self-pay | Admitting: Internal Medicine

## 2017-11-11 DIAGNOSIS — R590 Localized enlarged lymph nodes: Secondary | ICD-10-CM

## 2017-11-11 DIAGNOSIS — R55 Syncope and collapse: Secondary | ICD-10-CM

## 2017-11-11 DIAGNOSIS — L049 Acute lymphadenitis, unspecified: Secondary | ICD-10-CM

## 2017-11-11 MED ORDER — ROSUVASTATIN CALCIUM 10 MG PO TABS: 10.0000 mg | ORAL_TABLET | Freq: Every day | ORAL | 3 refills | Status: DC

## 2017-11-11 MED ORDER — ASPIRIN 81 MG PO TBEC: 81.0000 mg | DELAYED_RELEASE_TABLET | Freq: Every day | ORAL | 12 refills | Status: DC

## 2017-11-17 ENCOUNTER — Other Ambulatory Visit (HOSPITAL_COMMUNITY): Payer: BC Managed Care – PPO

## 2017-11-19 ENCOUNTER — Ambulatory Visit (HOSPITAL_COMMUNITY): Payer: BC Managed Care – PPO | Attending: Internal Medicine

## 2017-11-19 ENCOUNTER — Other Ambulatory Visit: Payer: Self-pay

## 2017-11-19 DIAGNOSIS — I071 Rheumatic tricuspid insufficiency: Secondary | ICD-10-CM | POA: Diagnosis not present

## 2017-11-19 DIAGNOSIS — E785 Hyperlipidemia, unspecified: Secondary | ICD-10-CM | POA: Insufficient documentation

## 2017-11-19 DIAGNOSIS — E669 Obesity, unspecified: Secondary | ICD-10-CM | POA: Insufficient documentation

## 2017-11-19 DIAGNOSIS — I10 Essential (primary) hypertension: Secondary | ICD-10-CM | POA: Insufficient documentation

## 2017-11-19 DIAGNOSIS — R55 Syncope and collapse: Secondary | ICD-10-CM | POA: Diagnosis not present

## 2017-11-19 DIAGNOSIS — Z6830 Body mass index (BMI) 30.0-30.9, adult: Secondary | ICD-10-CM | POA: Diagnosis not present

## 2017-12-02 ENCOUNTER — Telehealth: Payer: Self-pay | Admitting: Radiology

## 2017-12-02 NOTE — Telephone Encounter (Signed)
13 hour prep called to [facility] [address].  Called pt and gave her times for prep. Prednisone 50 mg at 1/6 11 pm, 1/7 5 am, 1/7 11 am with Benadryl 50 mg at 11 am dose.

## 2017-12-13 ENCOUNTER — Other Ambulatory Visit: Payer: BC Managed Care – PPO

## 2018-01-07 ENCOUNTER — Other Ambulatory Visit: Payer: BC Managed Care – PPO

## 2018-02-01 IMAGING — RF DG UGI W/ HIGH DENSITY W/KUB
8 series · 14 of 24 positions shown · non-contrast
Comparison: Upper GI series of October 18, 2015

CLINICAL DATA: Multiple upper respiratory and dental infection
episodes this spurring. Has lap band for weight loss. Due to
inability to the mid to low some reflux the lap band was deflated
last week in there is concern of it having possibly changed
position.

EXAM:
UPPER GI SERIES WITH KUB
TECHNIQUE: After obtaining a scout radiograph a routine upper GI series was
performed using thin and high density barium. One half dose of
effervescent crystals was administered. A barium tablet was also
administered.
FLUOROSCOPY TIME:  Fluoroscopy Time:  2 minutes, 48 seconds
Radiation Exposure Index (if provided by the fluoroscopic device): 2
minutes 37 mGy
Number of Acquired Spot Images: 13+ 4 video loops

[Series 1: one shot · 1 of 1 slices shown (1 of 4)]
[im 1/1]
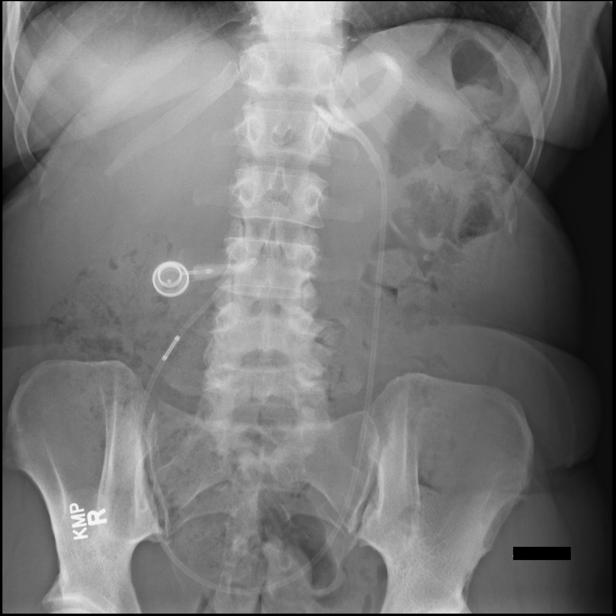

[Series 2: sequence · 1 of 60 frames shown (1 of 4)]
[frame 31/60]
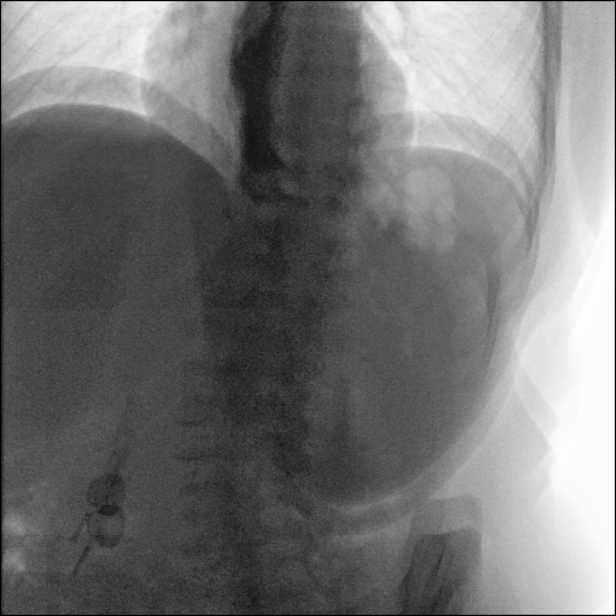

[Series 3: one shot · 1 of 2 slices shown (2 of 4)]
[im 1/2]
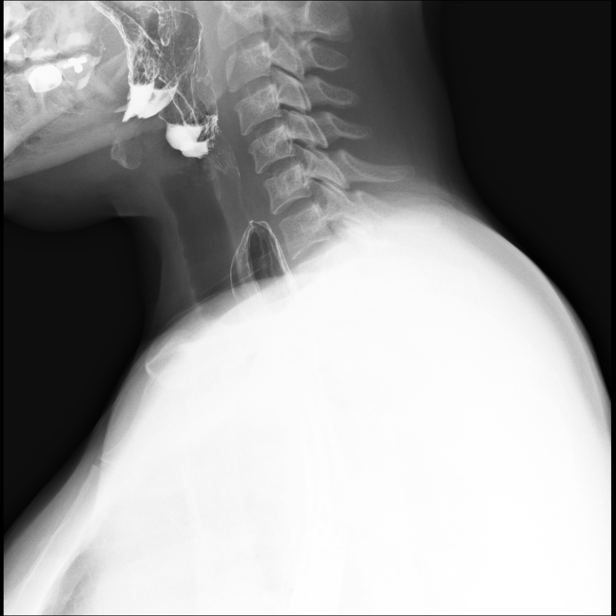

[Series 4: sequence · 2 of 68 frames shown (2 of 4)]
[frame 33/68]
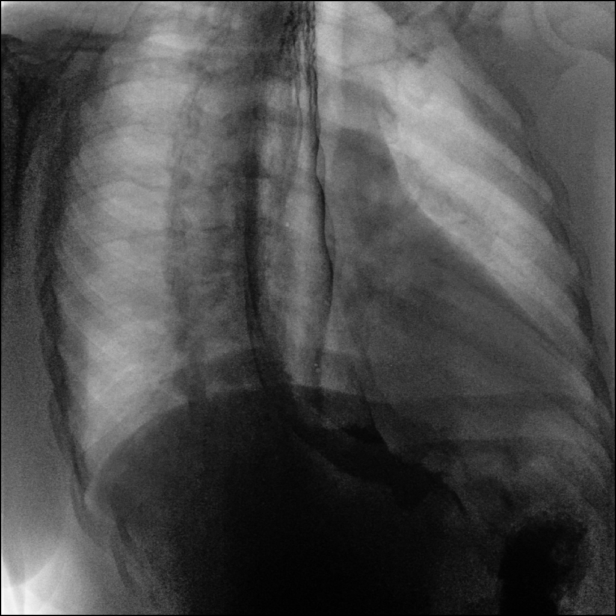
[frame 35/68]
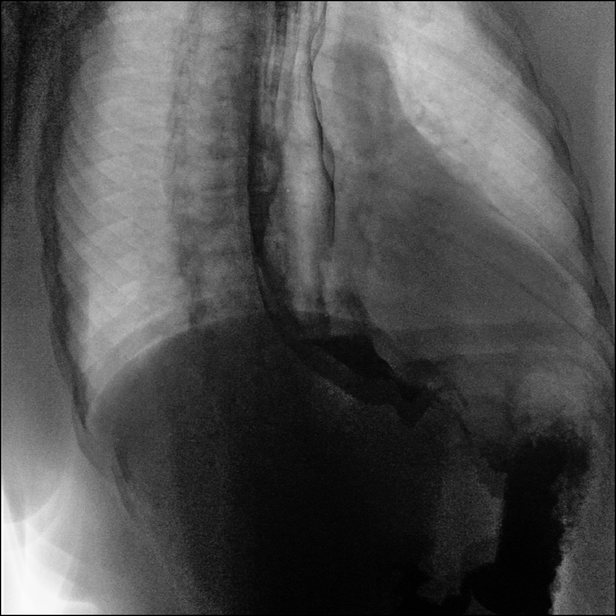

[Series 5: one shot · 3 of 7 slices shown (3 of 4)]
[im 1/7]
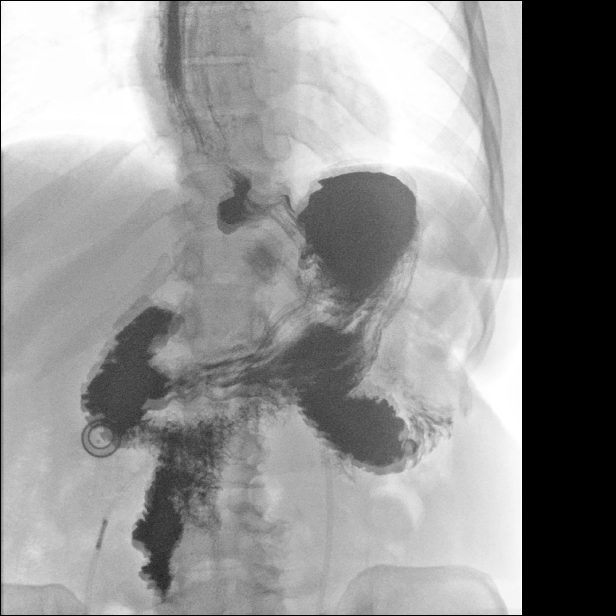
[im 4/7]
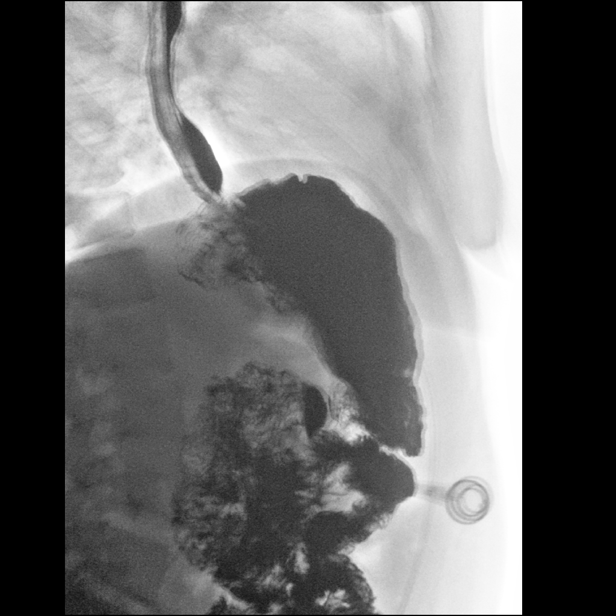
[im 5/7]
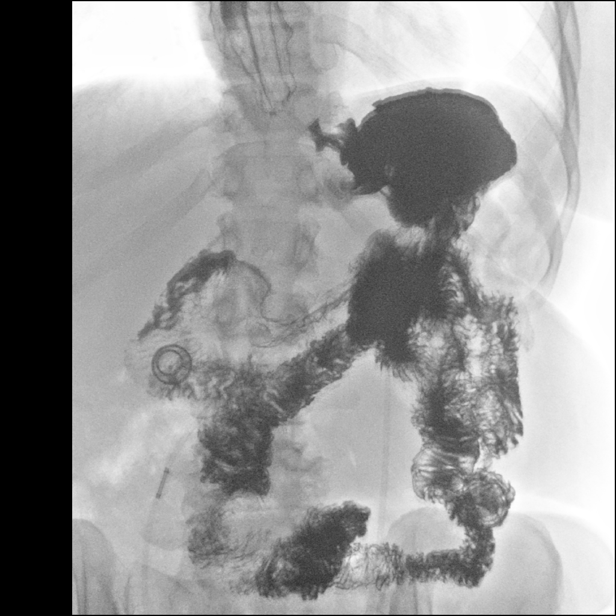

[Series 6: sequence · 2 of 60 frames shown (3 of 4)]
[frame 10/60]
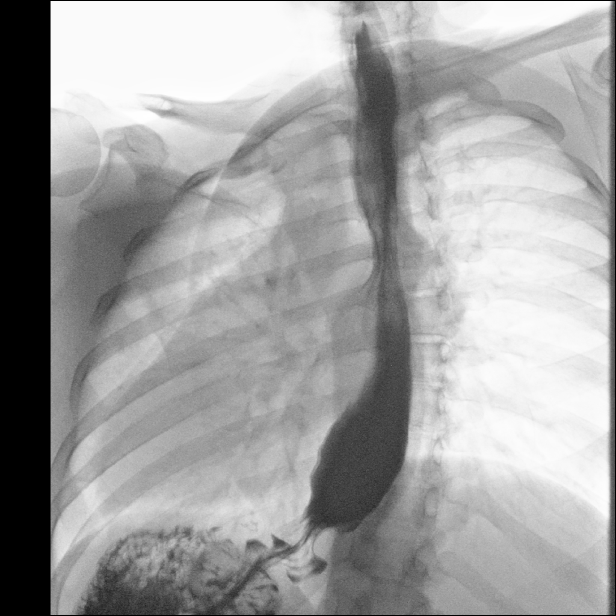
[frame 31/60]
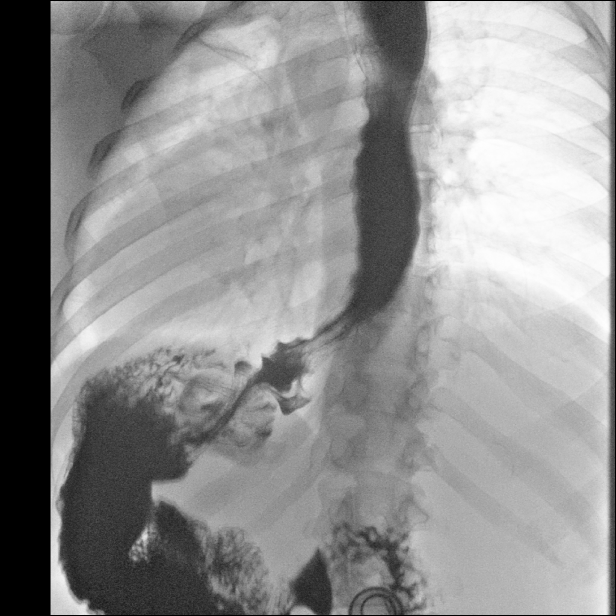

[Series 7: one shot · 2 of 4 slices shown (4 of 4)]
[im 2/4]
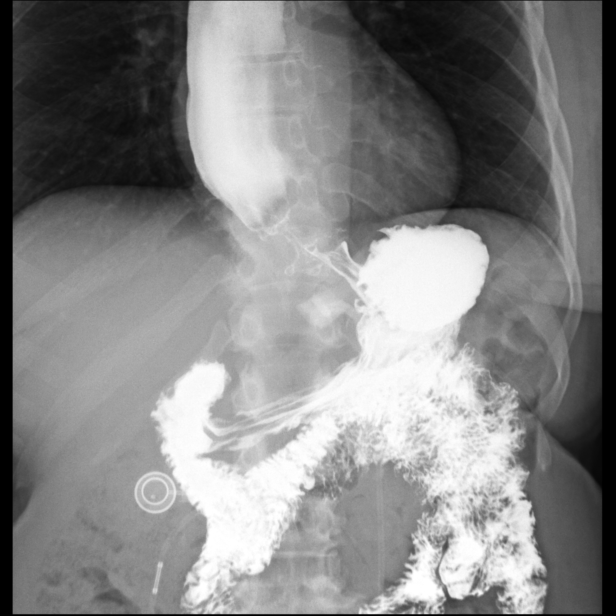
[im 3/4]
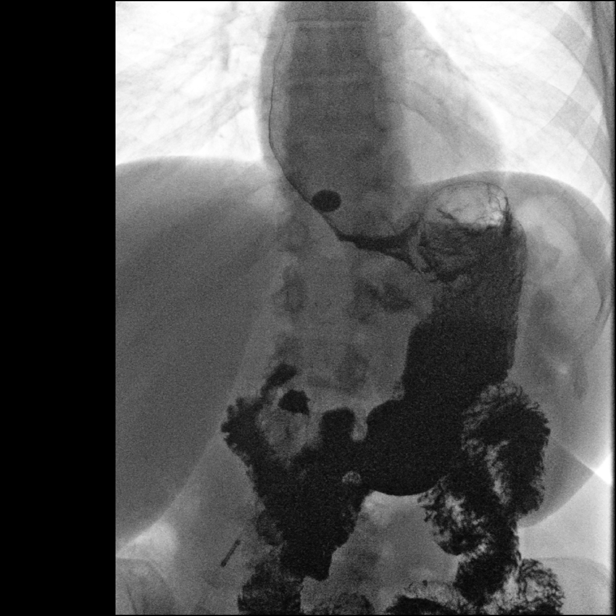

[Series 8: sequence · 2 of 38 frames shown (4 of 4)]
[frame 6/38]
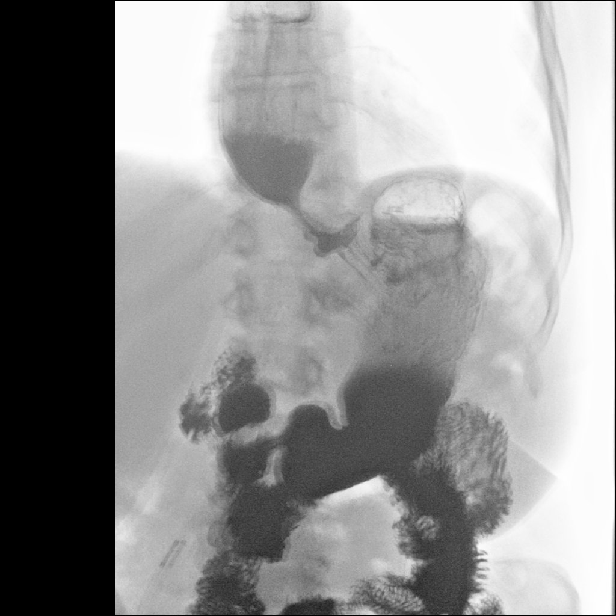
[frame 38/38]
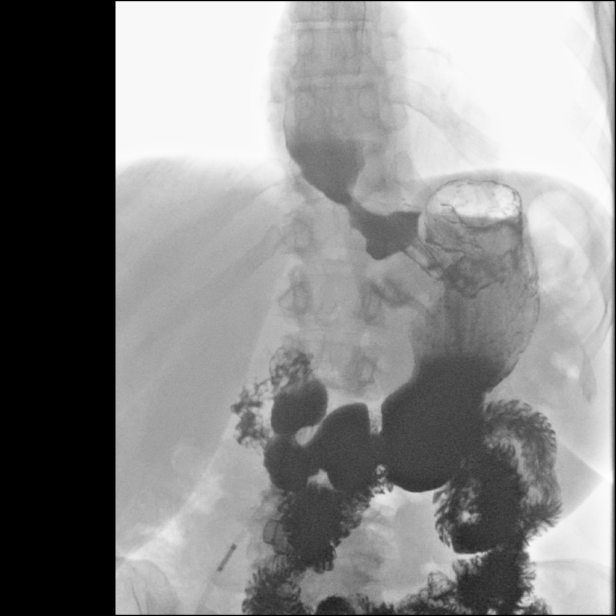

[14 of 24 positions shown; findings below may reference images not displayed]

FINDINGS: The patient ingested thick and thin barium and the half dose of
effervescent crystals without difficulty. There was no laryngeal
penetration of the barium. The cervical and thoracic esophagus
distended well down to the level of the laparoscopic band. Here
there was luminal narrowing. There was some delay in emptying of the
esophagus into the stomach. No significant reflux was observed.
There was no evidence of ulceration.

The stomach distended well. The mucosal fold pattern was normal.
Gastric emptying was prompt. The duodenal bulb and Celsius sweep
were normal. In the prone position esophageal motility was good but
there was some delay in emptying of the esophagus through the lap
band. The barium tablet hung in the distal esophagus just proximal
to the laparoscopic band.
IMPRESSION: Normal appearance of the cervical esophagus. No laryngeal
penetration of the barium. No gastroesophageal reflux was observed.

There is some delay in the emptying of the esophagus through the
laparoscopic band. The laparoscopic band itself appears to be in
stable position. The barium tablet would not pass through the
laparoscopic band despite additional sips of barium and water.

Normal appearance of the stomach and duodenum.

## 2018-04-05 ENCOUNTER — Other Ambulatory Visit: Payer: Self-pay | Admitting: Internal Medicine

## 2018-04-06 ENCOUNTER — Other Ambulatory Visit: Payer: Self-pay | Admitting: Internal Medicine

## 2018-07-07 ENCOUNTER — Ambulatory Visit (INDEPENDENT_AMBULATORY_CARE_PROVIDER_SITE_OTHER): Payer: BC Managed Care – PPO | Admitting: Internal Medicine

## 2018-07-07 ENCOUNTER — Encounter: Payer: Self-pay | Admitting: Internal Medicine

## 2018-07-07 VITALS — BP 124/84 | HR 60 | Temp 98.9°F | Ht 66.0 in | Wt 188.0 lb

## 2018-07-07 DIAGNOSIS — Z0000 Encounter for general adult medical examination without abnormal findings: Secondary | ICD-10-CM

## 2018-07-07 DIAGNOSIS — D509 Iron deficiency anemia, unspecified: Secondary | ICD-10-CM

## 2018-07-07 DIAGNOSIS — R21 Rash and other nonspecific skin eruption: Secondary | ICD-10-CM

## 2018-07-07 DIAGNOSIS — R739 Hyperglycemia, unspecified: Secondary | ICD-10-CM | POA: Diagnosis not present

## 2018-07-07 MED ORDER — RANITIDINE HCL 150 MG PO TABS: 150.0000 mg | ORAL_TABLET | Freq: Two times a day (BID) | ORAL | 3 refills | Status: DC

## 2018-07-07 MED ORDER — METOPROLOL TARTRATE 25 MG PO TABS: ORAL_TABLET | ORAL | 3 refills | Status: DC

## 2018-07-07 MED ORDER — ROSUVASTATIN CALCIUM 10 MG PO TABS: 10.0000 mg | ORAL_TABLET | Freq: Every day | ORAL | 3 refills | Status: DC

## 2018-07-07 MED ORDER — TRIAMCINOLONE ACETONIDE 0.1 % EX CREA: 1.0000 "application " | TOPICAL_CREAM | Freq: Two times a day (BID) | CUTANEOUS | 1 refills | Status: AC

## 2018-07-07 MED ORDER — CITALOPRAM HYDROBROMIDE 40 MG PO TABS: ORAL_TABLET | ORAL | 3 refills | Status: DC

## 2018-07-07 NOTE — Assessment & Plan Note (Signed)
Ok for triam cr prn

## 2018-07-07 NOTE — Patient Instructions (Signed)
Please take all new medication as prescribed - the steroid cream  Please continue all other medications as before, and refills have been done if requested.  Please have the pharmacy call with any other refills you may need.  Please continue your efforts at being more active, low cholesterol diet, and weight control.  You are otherwise up to date with prevention measures today.  Please keep your appointments with your specialists as you may have planned  Please go to the LAB in the Basement (turn left off the elevator) for the tests to be done today  You will be contacted by phone if any changes need to be made immediately.  Otherwise, you will receive a letter about your results with an explanation, but please check with MyChart first.  Please remember to sign up for MyChart if you have not done so, as this will be important to you in the future with finding out test results, communicating by private email, and scheduling acute appointments online when needed.  Please return in 1 year for your yearly visit, or sooner if needed, with Lab testing done 3-5 days before

## 2018-07-07 NOTE — Assessment & Plan Note (Signed)
States unable to tolerate po, for f/u lab, consider IV iron

## 2018-07-07 NOTE — Assessment & Plan Note (Signed)

## 2018-07-07 NOTE — Progress Notes (Signed)
Subjective:    Patient ID: Katherine Tucker, female    DOB: 10-Jun-1971, 47 y.o.   MRN: [id number]  HPI  Here for wellness and f/u;  Overall doing ok;  Pt denies Chest pain, worsening SOB, DOE, wheezing, orthopnea, PND, worsening LE edema, palpitations, dizziness or syncope.  Pt denies neurological change such as new headache, facial or extremity weakness.  Pt denies polydipsia, polyuria, or low sugar symptoms. Pt states overall good compliance with treatment and medications, good tolerability, and has been trying to follow appropriate diet.  Pt denies worsening depressive symptoms, suicidal ideation or panic. No fever, night sweats, wt loss, loss of appetite, or other constitutional symptoms.  Pt states good ability with ADL's, has low fall risk, home safety reviewed and adequate, no other significant changes in hearing or vision, and only occasionally active with exercise. S/p lap band with iron deficient anemia likely nutritional. Also has tiny small slightly raised white bumps to post arms below the elbows Past Medical History:  Diagnosis Date  . Anxiety   . Chicken pox   . Depression   . Elevated prolactin level (HCC)    elevated in 2009. MRI brain normal  . GERD (gastroesophageal reflux disease)   . Headache(784.0)   . Hiatal hernia   . Hyperlipidemia   . Hypertension   . Mammary duct ectasia of right breast 2009   excision of duct - Dr. Davina Poke 2010 - nonmalignant path  . Morbid obesity (HCC)    Past Surgical History:  Procedure Laterality Date  . BLADDER SURGERY     childhood  . breast lesion excison Right Jan 2010   Dr. Luisa Hart. Ductal ectasia  . lapband procedure  5-11   [facility]  . MOUTH SURGERY      reports that she has never smoked. She has never used smokeless tobacco. She reports that she does not drink alcohol or use drugs. family history includes Arthritis in her mother; Cancer in her paternal aunt; Diabetes in her other; Hyperlipidemia in her father and mother;  Hypertension in her father and mother. Allergies  Allergen Reactions  . Contrast Media [Iodinated Diagnostic Agents] Nausea And Vomiting    Severe nausea and vomiting   Current Outpatient Medications on File Prior to Visit  Medication Sig Dispense Refill  . aspirin 81 MG EC tablet Take 1 tablet (81 mg total) by mouth daily. Swallow whole. 30 tablet 12  . Calcium 1500 MG tablet Take 1,500 mg by mouth daily.      . multivitamin (THERAGRAN) tablet Take 1 tablet by mouth daily. Centrum [place].     No current facility-administered medications on file prior to visit.    Review of Systems  Constitutional: Negative for other unusual diaphoresis or sweats HENT: Negative for ear discharge or swelling Eyes: Negative for other worsening visual disturbances Respiratory: Negative for stridor or other swelling  Gastrointestinal: Negative for worsening distension or other blood Genitourinary: Negative for retention or other urinary change Musculoskeletal: Negative for other MSK pain or swelling Skin: Negative for color change or other new lesions Neurological: Negative for worsening tremors and other numbness  Psychiatric/Behavioral: Negative for worsening agitation or other fatigue All other system neg per pt    Objective:   Physical Exam BP 124/84   Pulse 60   Temp 98.9 F (37.2 C) (Oral)   Ht 5\' 6"  (1.676 m)   Wt 188 lb (85.3 kg)   SpO2 98%   BMI 30.34 kg/m  VS noted,  Constitutional: Pt is  oriented to person, place, and time. Appears well-developed and well-nourished, in no significant distress and comfortable Head: Normocephalic and atraumatic  Eyes: Conjunctivae and EOM are normal. Pupils are equal, round, and reactive to light Right Ear: External ear normal without discharge Left Ear: External ear normal without discharge Nose: Nose without discharge or deformity Mouth/Throat: Oropharynx is without other ulcerations and moist  Neck: Normal range of motion. Neck supple. No JVD  present. No tracheal deviation present or significant neck LA or mass Cardiovascular: Normal rate, regular rhythm, normal heart sounds and intact distal pulses.   Pulmonary/Chest: WOB normal and breath sounds without rales or wheezing  Abdominal: Soft. Bowel sounds are normal. NT. No HSM  Musculoskeletal: Normal range of motion. Exhibits no edema Lymphadenopathy: Has no other cervical adenopathy.  Neurological: Pt is alert and oriented to person, place, and time. Pt has normal reflexes. No cranial nerve deficit. Motor grossly intact, Gait intact Skin: Skin is warm and dry. No rash noted or new ulcerations Psychiatric:  Has normal mood and affect. Behavior is normal without agitation No other exam findings Lab Results  Component Value Date   WBC 7.0 05/13/2017   HGB 9.1 (L) 05/13/2017   HCT 28.7 (L) 05/13/2017   PLT 338.0 05/13/2017   GLUCOSE 104 (H) 05/13/2017   CHOL 156 05/13/2017   TRIG 55.0 05/13/2017   HDL 64.90 05/13/2017   LDLCALC 80 05/13/2017   ALT 10 05/13/2017   AST 15 05/13/2017   NA 136 05/13/2017   K 3.3 (L) 05/13/2017   CL 102 05/13/2017   CREATININE 0.91 05/13/2017   BUN 10 05/13/2017   CO2 28 05/13/2017   TSH 0.83 05/13/2017   HGBA1C 5.8 02/22/2009       Assessment & Plan:

## 2018-07-07 NOTE — Assessment & Plan Note (Signed)
stable overall by history and exam, recent data reviewed with pt, and pt to continue medical treatment as before,  to f/u any worsening symptoms or concerns Lab Results  Component Value Date   HGBA1C 5.8 02/22/2009

## 2018-11-08 ENCOUNTER — Encounter: Payer: Self-pay | Admitting: Internal Medicine

## 2018-11-08 ENCOUNTER — Ambulatory Visit: Payer: BC Managed Care – PPO | Admitting: Internal Medicine

## 2018-11-08 VITALS — BP 114/68 | HR 77 | Temp 98.0°F | Ht 66.0 in | Wt 194.0 lb

## 2018-11-08 DIAGNOSIS — F329 Major depressive disorder, single episode, unspecified: Secondary | ICD-10-CM

## 2018-11-08 DIAGNOSIS — F32A Depression, unspecified: Secondary | ICD-10-CM

## 2018-11-08 DIAGNOSIS — R21 Rash and other nonspecific skin eruption: Secondary | ICD-10-CM

## 2018-11-08 DIAGNOSIS — Z23 Encounter for immunization: Secondary | ICD-10-CM | POA: Diagnosis not present

## 2018-11-08 DIAGNOSIS — I10 Essential (primary) hypertension: Secondary | ICD-10-CM | POA: Diagnosis not present

## 2018-11-08 MED ORDER — DOXYCYCLINE HYCLATE 100 MG PO TABS: 100.0000 mg | ORAL_TABLET | Freq: Two times a day (BID) | ORAL | 0 refills | Status: DC

## 2018-11-08 NOTE — Assessment & Plan Note (Signed)
stable overall by history and exam, recent data reviewed with pt, and pt to continue medical treatment as before,  to f/u any worsening symptoms or concerns  

## 2018-11-08 NOTE — Patient Instructions (Signed)
Please take all new medication as prescribed - the antibiotic  Please continue all other medications as before, and refills have been done if requested.  Please have the pharmacy call with any other refills you may need.  Please keep your appointments with your specialists as you may have planned  You will be contacted regarding the referral for: Dermatology

## 2018-11-08 NOTE — Assessment & Plan Note (Signed)
?   Acne form like, for doxy trial, also refer to dermatology per pt request

## 2018-11-08 NOTE — Progress Notes (Signed)
Subjective:    Patient ID: Katherine Tucker, female    DOB: 01-25-1971, 47 y.o.   MRN: [id number]  HPI  Here with  2 mo onset itchy rash to back, thought was detergent related at first but no better with changing this, but looked at her back recently and had "pimples" that just do not seem to improved; triam cr not helping.  Pt denies chest pain, increased sob or doe, wheezing, orthopnea, PND, increased LE swelling, palpitations, dizziness or syncope.  Has gained several lbs recently.  Pt denies polydipsia, polyuria.  Denies worsening depressive symptoms, suicidal ideation, or panic Past Medical History:  Diagnosis Date  . Anxiety   . Chicken pox   . Depression   . Elevated prolactin level (HCC)    elevated in 2009. MRI brain normal  . GERD (gastroesophageal reflux disease)   . Headache(784.0)   . Hiatal hernia   . Hyperlipidemia   . Hypertension   . Mammary duct ectasia of right breast 2009   excision of duct - Dr. Davina Poke 2010 - nonmalignant path  . Morbid obesity (HCC)    Past Surgical History:  Procedure Laterality Date  . BLADDER SURGERY     childhood  . breast lesion excison Right Jan 2010   Dr. Luisa Hart. Ductal ectasia  . lapband procedure  5-11   [facility]  . MOUTH SURGERY      reports that she has never smoked. She has never used smokeless tobacco. She reports that she does not drink alcohol or use drugs. family history includes Arthritis in her mother; Cancer in her paternal aunt; Diabetes in her other; Hyperlipidemia in her father and mother; Hypertension in her father and mother. Allergies  Allergen Reactions  . Contrast Media [Iodinated Diagnostic Agents] Nausea And Vomiting    Severe nausea and vomiting   Current Outpatient Medications on File Prior to Visit  Medication Sig Dispense Refill  . aspirin 81 MG EC tablet Take 1 tablet (81 mg total) by mouth daily. Swallow whole. 30 tablet 12  . Calcium 1500 MG tablet Take 1,500 mg by mouth daily.      . citalopram  (CELEXA) 40 MG tablet TAKE 1 TABLET BY MOUTH EVERY DAY 90 tablet 3  . metoprolol tartrate (LOPRESSOR) 25 MG tablet TAKE 1 TABLET (25 MG TOTAL) BY MOUTH 2 (TWO) TIMES DAILY. 180 tablet 3  . multivitamin (THERAGRAN) tablet Take 1 tablet by mouth daily. Centrum [place].    . ranitidine (ZANTAC) 150 MG tablet Take 1 tablet (150 mg total) by mouth 2 (two) times daily. 180 tablet 3  . rosuvastatin (CRESTOR) 10 MG tablet Take 1 tablet (10 mg total) by mouth daily. 90 tablet 3  . triamcinolone cream (KENALOG) 0.1 % Apply 1 application topically 2 (two) times daily. 30 g 1   No current facility-administered medications on file prior to visit.    Review of Systems  Constitutional: Negative for other unusual diaphoresis or sweats HENT: Negative for ear discharge or swelling Eyes: Negative for other worsening visual disturbances Respiratory: Negative for stridor or other swelling  Gastrointestinal: Negative for worsening distension or other blood Genitourinary: Negative for retention or other urinary change Musculoskeletal: Negative for other MSK pain or swelling Skin: Negative for color change or other new lesions Neurological: Negative for worsening tremors and other numbness  Psychiatric/Behavioral: Negative for worsening agitation or other fatigue All other system neg per pt    Objective:   Physical Exam BP 114/68   Pulse 77  Temp 98 F (36.7 C) (Oral)   Ht 5\' 6"  (1.676 m)   Wt 194 lb (88 kg)   SpO2 99%   BMI 31.31 kg/m  VS noted,  Constitutional: Pt appears in NAD HENT: Head: NCAT.  Right Ear: External ear normal.  Left Ear: External ear normal.  Eyes: . Pupils are equal, round, and reactive to light. Conjunctivae and EOM are normal Nose: without d/c or deformity Neck: Neck supple. Gross normal ROM Cardiovascular: Normal rate and regular rhythm.   Pulmonary/Chest: Effort normal and breath sounds without rales or wheezing.  Abd:  Soft, NT, ND, + BS, no organomegaly Neurological: Pt  is alert. At baseline orientation, motor grossly intact Skin: Skin is warm. + possible acne form changes rashes to bilat upper back with postinflammatory hyperpigmentation, no other new lesions, no LE edema Psychiatric: Pt behavior is normal without agitation , not depressed affect No other exam findings  Lab Results  Component Value Date   WBC 7.0 05/13/2017   HGB 9.1 (L) 05/13/2017   HCT 28.7 (L) 05/13/2017   PLT 338.0 05/13/2017   GLUCOSE 104 (H) 05/13/2017   CHOL 156 05/13/2017   TRIG 55.0 05/13/2017   HDL 64.90 05/13/2017   LDLCALC 80 05/13/2017   ALT 10 05/13/2017   AST 15 05/13/2017   NA 136 05/13/2017   K 3.3 (L) 05/13/2017   CL 102 05/13/2017   CREATININE 0.91 05/13/2017   BUN 10 05/13/2017   CO2 28 05/13/2017   TSH 0.83 05/13/2017   HGBA1C 5.8 02/22/2009       Assessment & Plan:

## 2018-11-13 ENCOUNTER — Encounter: Payer: Self-pay | Admitting: Internal Medicine

## 2018-11-14 MED ORDER — FLUCONAZOLE 150 MG PO TABS: ORAL_TABLET | ORAL | 1 refills | Status: DC

## 2018-12-23 ENCOUNTER — Encounter: Payer: Self-pay | Admitting: Family Medicine

## 2018-12-23 ENCOUNTER — Ambulatory Visit: Payer: BC Managed Care – PPO | Admitting: Family Medicine

## 2018-12-23 VITALS — BP 128/66 | HR 70 | Temp 98.1°F | Ht 66.0 in | Wt 199.0 lb

## 2018-12-23 DIAGNOSIS — J0100 Acute maxillary sinusitis, unspecified: Secondary | ICD-10-CM | POA: Diagnosis not present

## 2018-12-23 MED ORDER — HYDROCOD POLST-CPM POLST ER 10-8 MG/5ML PO SUER: 5.0000 mL | Freq: Two times a day (BID) | ORAL | 0 refills | Status: DC | PRN

## 2018-12-23 MED ORDER — FLUCONAZOLE 150 MG PO TABS: 150.0000 mg | ORAL_TABLET | Freq: Once | ORAL | 0 refills | Status: AC

## 2018-12-23 MED ORDER — AMOXICILLIN-POT CLAVULANATE 875-125 MG PO TABS: 1.0000 | ORAL_TABLET | Freq: Two times a day (BID) | ORAL | 0 refills | Status: DC

## 2018-12-23 NOTE — Progress Notes (Signed)
Katherine Tucker - 48 y.o. female MRN [id number]  Date of birth: [birth year]  Subjective Chief Complaint  Patient presents with  . Headache    ongoing for two weeks-has had a sore throat. She has been taking Nyquil with no improvement    HPI Katherine Tucker is a 48 y.o. female who complains of congestion, sore throat, swollen glands, post nasal drip, productive cough, myalgias, headache, bilateral sinus pain and suspected fevers but not measured at home for 15 days.  Sinus pain and headache have been worsening over the past few days.  She has tried nyquil without much relief.  She denies a history of chest pain, chills, dizziness, nausea, shortness of breath, vomiting and wheezing and denies a history of asthma. Patient does not smoke cigarettes.  ROS:  A comprehensive ROS was completed and negative except as noted per HPI     Allergies  Allergen Reactions  . Contrast Media [Iodinated Diagnostic Agents] Nausea And Vomiting    Severe nausea and vomiting    Past Medical History:  Diagnosis Date  . Anxiety   . Chicken pox   . Depression   . Elevated prolactin level (HCC)    elevated in 2009. MRI brain normal  . GERD (gastroesophageal reflux disease)   . Headache(784.0)   . Hiatal hernia   . Hyperlipidemia   . Hypertension   . Mammary duct ectasia of right breast 2009   excision of duct - Dr. Davina Poke 2010 - nonmalignant path  . Morbid obesity (HCC)     Past Surgical History:  Procedure Laterality Date  . BLADDER SURGERY     childhood  . breast lesion excison Right Jan 2010   Dr. Luisa Hart. Ductal ectasia  . lapband procedure  5-11   [facility]  . MOUTH SURGERY      Social History   Socioeconomic History  . Marital status: Single    Spouse name: Not on file  . Number of children: Not on file  . Years of education: 14  . Highest education level: Not on file  Occupational History  . 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 Financial resource strain: Not on file  . Food insecurity:    Worry: Not on file    Inability: Not on file  . Transportation needs:    Medical: Not on file    Non-medical: Not on file  Tobacco Use  . Smoking status: Never Smoker  . Smokeless tobacco: Never Used  Substance and Sexual Activity  . Alcohol use: No  . Drug use: No  . Sexual activity: Never  Lifestyle  . Physical activity:    Days per week: Not on file    Minutes per session: Not on file  . Stress: Not on file  Relationships  . Social connections:    Talks on phone: Not on file    Gets together: Not on file    Attends religious service: Not on file    Active member of club or organization: Not on file    Attends meetings of clubs or organizations: Not on file    Relationship status: Not on file  Other Topics Concern  . Not on file  Social History Narrative   HSG, Completed Field seismologist, [employer]. Single. 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 Lives  alone w/ 2 dogs. Remains active and in touch with her sorority sisters. Her mother lives in [place] and they are close.           Family History  Problem Relation Age of Onset  . Arthritis Mother   . Hypertension Mother   . Hyperlipidemia Mother   . Hypertension Father   . Hyperlipidemia Father   . Cancer Paternal Aunt        breast  . Diabetes Other     Health Maintenance  Topic Date Due  . PAP SMEAR-Modifier  09/06/2018  . TETANUS/TDAP  03/30/2023  . INFLUENZA VACCINE  Completed  . HIV Screening  Completed    ----------------------------------------------------------------------------------------------------------------------------------------------------------------------------------------------------------------- Physical Exam BP 128/66   Pulse 70   Temp 98.1 F (36.7 C) (Oral)   Ht 5\' 6"  (1.676 m)    Wt 199 lb (90.3 kg)   SpO2 99%   BMI 32.12 kg/m   Physical Exam Constitutional:      Appearance: She is well-developed. She is ill-appearing. She is not toxic-appearing.  HENT:     Head: Normocephalic and atraumatic.     Ears:     Comments: Serous effusion bilaterally.     Nose: Congestion present.     Right Sinus: Maxillary sinus tenderness present.     Left Sinus: Maxillary sinus tenderness present.     Mouth/Throat:     Mouth: Mucous membranes are dry.  Eyes:     General: No scleral icterus. Neck:     Musculoskeletal: Neck supple.  Cardiovascular:     Rate and Rhythm: Normal rate and regular rhythm.  Pulmonary:     Effort: Pulmonary effort is normal.     Breath sounds: Normal breath sounds.  Lymphadenopathy:     Cervical: Cervical adenopathy present.  Skin:    General: Skin is warm and dry.     Findings: No rash.  Neurological:     General: No focal deficit present.     Mental Status: She is alert.  Psychiatric:        Mood and Affect: Mood normal.        Behavior: Behavior normal.     ------------------------------------------------------------------------------------------------------------------------------------------------------------------------------------------------------------------- Assessment and Plan  Acute non-recurrent maxillary sinusitis Rx for augmentin given prolonged, now acutely worsening symptoms. Tussionex for cough Rx for diflucan as she often gets yeast infection after antibiotics.  Symptomatic therapy suggested: push fluids, rest, gargle warm salt water, use vaporizer or mist prn and return office visit prn if symptoms persist or worsen. Call or return to clinic prn if these symptoms worsen or fail to improve as anticipated.

## 2018-12-23 NOTE — Assessment & Plan Note (Signed)
Rx for augmentin given prolonged, now acutely worsening symptoms. Tussionex for cough Rx for diflucan as she often gets yeast infection after antibiotics.  Symptomatic therapy suggested: push fluids, rest, gargle warm salt water, use vaporizer or mist prn and return office visit prn if symptoms persist or worsen. Call or return to clinic prn if these symptoms worsen or fail to improve as anticipated.

## 2018-12-23 NOTE — Patient Instructions (Signed)
Sinusitis, Adult  Sinusitis is inflammation of your sinuses. Sinuses are hollow spaces in the bones around your face. Your sinuses are located:   Around your eyes.   In the middle of your forehead.   Behind your nose.   In your cheekbones.  Mucus normally drains out of your sinuses. When your nasal tissues become inflamed or swollen, mucus can become trapped or blocked. This allows bacteria, viruses, and fungi to grow, which leads to infection. Most infections of the sinuses are caused by a virus.  Sinusitis can develop quickly. It can last for up to 4 weeks (acute) or for more than 12 weeks (chronic). Sinusitis often develops after a cold.  What are the causes?  This condition is caused by anything that creates swelling in the sinuses or stops mucus from draining. This includes:   Allergies.   Asthma.   Infection from bacteria or viruses.   Deformities or blockages in your nose or sinuses.   Abnormal growths in the nose (nasal polyps).   Pollutants, such as chemicals or irritants in the air.   Infection from fungi (rare).  What increases the risk?  You are more likely to develop this condition if you:   Have a weak body defense system (immune system).   Do a lot of swimming or diving.   Overuse nasal sprays.   Smoke.  What are the signs or symptoms?  The main symptoms of this condition are pain and a feeling of pressure around the affected sinuses. Other symptoms include:   Stuffy nose or congestion.   Thick drainage from your nose.   Swelling and warmth over the affected sinuses.   Headache.   Upper toothache.   A cough that may get worse at night.   Extra mucus that collects in the throat or the back of the nose (postnasal drip).   Decreased sense of smell and taste.   Fatigue.   A fever.   Sore throat.   Bad breath.  How is this diagnosed?  This condition is diagnosed based on:   Your symptoms.   Your medical history.   A physical exam.   Tests to find out if your condition is  acute or chronic. This may include:  ? Checking your nose for nasal polyps.  ? Viewing your sinuses using a device that has a light (endoscope).  ? Testing for allergies or bacteria.  ? Imaging tests, such as an MRI or CT scan.  In rare cases, a bone biopsy may be done to rule out more serious types of fungal sinus disease.  How is this treated?  Treatment for sinusitis depends on the cause and whether your condition is chronic or acute.   If caused by a virus, your symptoms should go away on their own within 10 days. You may be given medicines to relieve symptoms. They include:  ? Medicines that shrink swollen nasal passages (topical intranasal decongestants).  ? Medicines that treat allergies (antihistamines).  ? A spray that eases inflammation of the nostrils (topical intranasal corticosteroids).  ? Rinses that help get rid of thick mucus in your nose (nasal saline washes).   If caused by bacteria, your health care provider may recommend waiting to see if your symptoms improve. Most bacterial infections will get better without antibiotic medicine. You may be given antibiotics if you have:  ? A severe infection.  ? A weak immune system.   If caused by narrow nasal passages or nasal polyps, you may need   to have surgery.  Follow these instructions at home:  Medicines   Take, use, or apply over-the-counter and prescription medicines only as told by your health care provider. These may include nasal sprays.   If you were prescribed an antibiotic medicine, take it as told by your health care provider. Do not stop taking the antibiotic even if you start to feel better.  Hydrate and humidify     Drink enough fluid to keep your urine pale yellow. Staying hydrated will help to thin your mucus.   Use a cool mist humidifier to keep the humidity level in your home above 50%.   Inhale steam for 10-15 minutes, 3-4 times a day, or as told by your health care provider. You can do this in the bathroom while a hot shower is  running.   Limit your exposure to cool or dry air.  Rest   Rest as much as possible.   Sleep with your head raised (elevated).   Make sure you get enough sleep each night.  General instructions     Apply a warm, moist washcloth to your face 3-4 times a day or as told by your health care provider. This will help with discomfort.   Wash your hands often with soap and water to reduce your exposure to germs. If soap and water are not available, use hand sanitizer.   Do not smoke. Avoid being around people who are smoking (secondhand smoke).   Keep all follow-up visits as told by your health care provider. This is important.  Contact a health care provider if:   You have a fever.   Your symptoms get worse.   Your symptoms do not improve within 10 days.  Get help right away if:   You have a severe headache.   You have persistent vomiting.   You have severe pain or swelling around your face or eyes.   You have vision problems.   You develop confusion.   Your neck is stiff.   You have trouble breathing.  Summary   Sinusitis is soreness and inflammation of your sinuses. Sinuses are hollow spaces in the bones around your face.   This condition is caused by nasal tissues that become inflamed or swollen. The swelling traps or blocks the flow of mucus. This allows bacteria, viruses, and fungi to grow, which leads to infection.   If you were prescribed an antibiotic medicine, take it as told by your health care provider. Do not stop taking the antibiotic even if you start to feel better.   Keep all follow-up visits as told by your health care provider. This is important.  This information is not intended to replace advice given to you by your health care provider. Make sure you discuss any questions you have with your health care provider.  Document Released: 11/23/2005 Document Revised: 04/25/2018 Document Reviewed: 04/25/2018  Elsevier Interactive Patient Education  2019 [employer] Inc[employer]

## 2018-12-27 ENCOUNTER — Other Ambulatory Visit: Payer: Self-pay | Admitting: Internal Medicine

## 2019-06-23 ENCOUNTER — Telehealth: Payer: Self-pay | Admitting: *Deleted

## 2019-06-23 MED ORDER — FAMOTIDINE 20 MG PO TABS: 20.0000 mg | ORAL_TABLET | Freq: Two times a day (BID) | ORAL | 3 refills | Status: DC

## 2019-06-23 NOTE — Telephone Encounter (Signed)
Ok to change zantac to pepcid - done erx

## 2019-06-23 NOTE — Telephone Encounter (Signed)
Rec'd paper request stating " Pt is requesting alternative for the Ranitidine due to med being recall. Pls advise.Marland KitchenJohny Chess

## 2019-07-12 ENCOUNTER — Telehealth: Payer: Self-pay | Admitting: Internal Medicine

## 2019-07-12 ENCOUNTER — Other Ambulatory Visit: Payer: Self-pay

## 2019-07-12 ENCOUNTER — Other Ambulatory Visit (INDEPENDENT_AMBULATORY_CARE_PROVIDER_SITE_OTHER): Payer: BC Managed Care – PPO

## 2019-07-12 ENCOUNTER — Ambulatory Visit (INDEPENDENT_AMBULATORY_CARE_PROVIDER_SITE_OTHER): Payer: BC Managed Care – PPO | Admitting: Internal Medicine

## 2019-07-12 ENCOUNTER — Encounter: Payer: Self-pay | Admitting: Internal Medicine

## 2019-07-12 ENCOUNTER — Other Ambulatory Visit: Payer: Self-pay | Admitting: Internal Medicine

## 2019-07-12 ENCOUNTER — Telehealth: Payer: Self-pay

## 2019-07-12 VITALS — BP 128/76 | HR 75 | Temp 98.3°F | Ht 66.0 in | Wt 190.0 lb

## 2019-07-12 DIAGNOSIS — E559 Vitamin D deficiency, unspecified: Secondary | ICD-10-CM

## 2019-07-12 DIAGNOSIS — T63464S Toxic effect of venom of wasps, undetermined, sequela: Secondary | ICD-10-CM | POA: Diagnosis not present

## 2019-07-12 DIAGNOSIS — E611 Iron deficiency: Secondary | ICD-10-CM

## 2019-07-12 DIAGNOSIS — D509 Iron deficiency anemia, unspecified: Secondary | ICD-10-CM

## 2019-07-12 DIAGNOSIS — Z0001 Encounter for general adult medical examination with abnormal findings: Secondary | ICD-10-CM

## 2019-07-12 DIAGNOSIS — R739 Hyperglycemia, unspecified: Secondary | ICD-10-CM

## 2019-07-12 DIAGNOSIS — T63461A Toxic effect of venom of wasps, accidental (unintentional), initial encounter: Secondary | ICD-10-CM | POA: Insufficient documentation

## 2019-07-12 DIAGNOSIS — I10 Essential (primary) hypertension: Secondary | ICD-10-CM

## 2019-07-12 DIAGNOSIS — E538 Deficiency of other specified B group vitamins: Secondary | ICD-10-CM

## 2019-07-12 DIAGNOSIS — M67471 Ganglion, right ankle and foot: Secondary | ICD-10-CM

## 2019-07-12 LAB — BASIC METABOLIC PANEL
BUN: 10 mg/dL (ref 6–23)
CO2: 28 mEq/L (ref 19–32)
Calcium: 9.6 mg/dL (ref 8.4–10.5)
Chloride: 105 mEq/L (ref 96–112)
Creatinine, Ser: 0.8 mg/dL (ref 0.40–1.20)
GFR: 93.53 mL/min (ref >60.00)
Glucose, Bld: 85 mg/dL (ref 70–99)
Potassium: 3.7 mEq/L (ref 3.5–5.1)
Sodium: 138 mEq/L (ref 135–145)

## 2019-07-12 LAB — CBC WITH DIFFERENTIAL/PLATELET
Basophils Absolute: 0 10*3/uL (ref 0.0–0.1)
Basophils Relative: 1.4 % (ref 0.0–3.0)
Eosinophils Absolute: 0.1 10*3/uL (ref 0.0–0.7)
Eosinophils Relative: 2.2 % (ref 0.0–5.0)
HCT: 22.7 % — CL (ref 36.0–46.0)
Hemoglobin: 6.7 g/dL — CL (ref 12.0–15.0)
Lymphocytes Relative: 33.3 % (ref 12.0–46.0)
Lymphs Abs: 1.1 10*3/uL (ref 0.7–4.0)
MCHC: 29.6 g/dL — ABNORMAL LOW (ref 30.0–36.0)
MCV: 61.7 fl — ABNORMAL LOW (ref 78.0–100.0)
Monocytes Absolute: 0.4 10*3/uL (ref 0.1–1.0)
Monocytes Relative: 12.7 % — ABNORMAL HIGH (ref 3.0–12.0)
Neutro Abs: 1.7 10*3/uL (ref 1.4–7.7)
Neutrophils Relative %: 50.4 % (ref 43.0–77.0)
Platelets: 244 10*3/uL (ref 150.0–400.0)
RBC: 3.69 Mil/uL — ABNORMAL LOW (ref 3.87–5.11)
RDW: 20.7 % — ABNORMAL HIGH (ref 11.5–15.5)
WBC: 3.3 10*3/uL — ABNORMAL LOW (ref 4.0–10.5)

## 2019-07-12 LAB — LIPID PANEL
Cholesterol: 150 mg/dL (ref 0–200)
HDL: 80.2 mg/dL (ref >39.00)
LDL Cholesterol: 63 mg/dL (ref 0–99)
NonHDL: 70.15
Total CHOL/HDL Ratio: 2
Triglycerides: 35 mg/dL (ref 0.0–149.0)
VLDL: 7 mg/dL (ref 0.0–40.0)

## 2019-07-12 LAB — URINALYSIS, ROUTINE W REFLEX MICROSCOPIC
Bilirubin Urine: NEGATIVE
Hgb urine dipstick: NEGATIVE
Ketones, ur: NEGATIVE
Leukocytes,Ua: NEGATIVE
Nitrite: NEGATIVE
Specific Gravity, Urine: 1.025 (ref 1.000–1.030)
Total Protein, Urine: NEGATIVE
Urine Glucose: NEGATIVE
Urobilinogen, UA: 0.2 (ref 0.0–1.0)
pH: 6.5 (ref 5.0–8.0)

## 2019-07-12 LAB — TSH: TSH: 1.11 u[IU]/mL (ref 0.35–4.50)

## 2019-07-12 LAB — HEMOGLOBIN A1C: Hgb A1c MFr Bld: 6.1 % (ref 4.6–6.5)

## 2019-07-12 LAB — HEPATIC FUNCTION PANEL
ALT: 28 U/L (ref 0–35)
AST: 34 U/L (ref 0–37)
Albumin: 4 g/dL (ref 3.5–5.2)
Alkaline Phosphatase: 37 U/L — ABNORMAL LOW (ref 39–117)
Bilirubin, Direct: 0.1 mg/dL (ref 0.0–0.3)
Total Bilirubin: 0.3 mg/dL (ref 0.2–1.2)
Total Protein: 6.7 g/dL (ref 6.0–8.3)

## 2019-07-12 LAB — IBC PANEL
Iron: 14 ug/dL — ABNORMAL LOW (ref 42–145)
Saturation Ratios: 3.2 % — ABNORMAL LOW (ref 20.0–50.0)
Transferrin: 316 mg/dL (ref 212.0–360.0)

## 2019-07-12 LAB — VITAMIN D 25 HYDROXY (VIT D DEFICIENCY, FRACTURES): VITD: 31.13 ng/mL (ref 30.00–100.00)

## 2019-07-12 LAB — VITAMIN B12: Vitamin B-12: 511 pg/mL (ref 211–911)

## 2019-07-12 MED ORDER — POLYSACCHARIDE IRON COMPLEX 150 MG PO CAPS: 150.0000 mg | ORAL_CAPSULE | Freq: Every day | ORAL | 3 refills | Status: DC

## 2019-07-12 MED ORDER — METOPROLOL TARTRATE 25 MG PO TABS: ORAL_TABLET | ORAL | 3 refills | Status: DC

## 2019-07-12 MED ORDER — CITALOPRAM HYDROBROMIDE 40 MG PO TABS: ORAL_TABLET | ORAL | 3 refills | Status: DC

## 2019-07-12 MED ORDER — ROSUVASTATIN CALCIUM 10 MG PO TABS: 10.0000 mg | ORAL_TABLET | Freq: Every day | ORAL | 3 refills | Status: DC

## 2019-07-12 MED ORDER — FAMOTIDINE 20 MG PO TABS: 20.0000 mg | ORAL_TABLET | Freq: Two times a day (BID) | ORAL | 3 refills | Status: DC

## 2019-07-12 MED ORDER — EPINEPHRINE 0.3 MG/0.3ML IJ SOAJ: 0.3000 mg | INTRAMUSCULAR | 1 refills | Status: DC | PRN

## 2019-07-12 NOTE — Assessment & Plan Note (Signed)

## 2019-07-12 NOTE — Patient Instructions (Addendum)
Please take all new medication as prescribed - the epipen if needed  Please call if the ganglion cyst becomes larger or more painful for podiatry referral  Please continue all other medications as before, and refills have been done if requested.  Please have the pharmacy call with any other refills you may need.  Please continue your efforts at being more active, low cholesterol diet, and weight control.  You are otherwise up to date with prevention measures today.  Please keep your appointments with your specialists as you may have planned  Please go to the LAB in the Basement (turn left off the elevator) for the tests to be done today  You will be contacted by phone if any changes need to be made immediately.  Otherwise, you will receive a letter about your results with an explanation, but please check with MyChart first.  Please remember to sign up for MyChart if you have not done so, as this will be important to you in the future with finding out test results, communicating by private email, and scheduling acute appointments online when needed.  Please return in 1 year for your yearly visit, or sooner if needed, with Lab testing done 3-5 days before

## 2019-07-12 NOTE — Telephone Encounter (Signed)
Ok to call pt -   Hgb is low and less than 7, when most persons have a blood transfusion.  Even if she is not feeling too bad, we are supposed to recommend she go to the ED for possible blood transfusion

## 2019-07-12 NOTE — Assessment & Plan Note (Addendum)
With significant hypersensitivity reaction it seems now improving, cant r/o angioedema, for epipen prn future wasp stings  In addition to the time spent performing CPE, I spent an additional 25 minutes face to face,in which greater than 50% of this time was spent in counseling and coordination of care for patient's acute illness as documented, including the differential dx, treatment, further evaluation and other management of wasp sting, ganglion cyst, iron deficiency anemia, HTN, hyperglycemia

## 2019-07-12 NOTE — Assessment & Plan Note (Signed)
stable overall by history and exam, recent data reviewed with pt, and pt to continue medical treatment as before,  to f/u any worsening symptoms or concerns  

## 2019-07-12 NOTE — Telephone Encounter (Signed)
Called pt, LVM.   

## 2019-07-12 NOTE — Telephone Encounter (Signed)
-----   Message from Juliet Rude, [place] sent at 07/12/2019 11:20 AM EDT ----- Critical Lab  Hemoglobin 6.7 Hematocrit 22.7

## 2019-07-12 NOTE — Progress Notes (Signed)
Subjective:    Patient ID: Katherine Tucker, female    DOB: 1971-07-20, 48 y.o.   MRN: [id number]  HPI  Here for wellness and f/u;  Overall doing ok;  Pt denies Chest pain, worsening SOB, DOE, wheezing, orthopnea, PND, worsening LE edema, palpitations, dizziness or syncope.  Pt denies neurological change such as new headache, facial or extremity weakness.  Pt denies polydipsia, polyuria, or low sugar symptoms. Pt states overall good compliance with treatment and medications, good tolerability, and has been trying to follow appropriate diet.  Pt denies worsening depressive symptoms, suicidal ideation or panic. No fever, night sweats, wt loss, loss of appetite, or other constitutional symptoms.  Pt states good ability with ADL's, has low fall risk, home safety reviewed and adequate, no other significant changes in hearing or vision, and only active with exercise. Walking 4 miles per day every day.   Also c/o right foot dorsal aspect cystic kind of mass swelling just distal to the ankle which is nontender and not getting larger in past wk, but not better as well.  Also had what sounds like a rather dramatic local reaction to a wasp sting about 2 wk ago to left lateral ankle area with significant discomfort and swelling now fortunately much improved but still some swelling persists and minor discomfort.   Past Medical History:  Diagnosis Date  . Anxiety   . Chicken pox   . Depression   . Elevated prolactin level (HCC)    elevated in 2009. MRI brain normal  . GERD (gastroesophageal reflux disease)   . Headache(784.0)   . Hiatal hernia   . Hyperlipidemia   . Hypertension   . Mammary duct ectasia of right breast 2009   excision of duct - Dr. Davina Pokeornet 2010 - nonmalignant path  . Morbid obesity (HCC)    Past Surgical History:  Procedure Laterality Date  . BLADDER SURGERY     childhood  . breast lesion excison Right Jan 2010   Dr. Luisa Hartornett. Ductal ectasia  . lapband procedure  5-11   [facility]  .  MOUTH SURGERY      reports that she has never smoked. She has never used smokeless tobacco. She reports that she does not drink alcohol or use drugs. family history includes Arthritis in her mother; Cancer in her paternal aunt; Diabetes in an other family member; Hyperlipidemia in her father and mother; Hypertension in her father and mother. Allergies  Allergen Reactions  . Contrast Media [Iodinated Diagnostic Agents] Nausea And Vomiting    Severe nausea and vomiting   Current Outpatient Medications on File Prior to Visit  Medication Sig Dispense Refill  . Calcium 1500 MG tablet Take 1,500 mg by mouth daily.      . [facility] ASPIRIN LOW DOSE 81 MG EC tablet TAKE 1 TABLET (81 MG TOTAL) BY MOUTH DAILY. SWALLOW WHOLE. 90 tablet 1  . multivitamin (THERAGRAN) tablet Take 1 tablet by mouth daily. Centrum [place].     No current facility-administered medications on file prior to visit.    Review of Systems Constitutional: Negative for other unusual diaphoresis, sweats, appetite or weight changes HENT: Negative for other worsening hearing loss, ear pain, facial swelling, mouth sores or neck stiffness.   Eyes: Negative for other worsening pain, redness or other visual disturbance.  Respiratory: Negative for other stridor or swelling Cardiovascular: Negative for other palpitations or other chest pain  Gastrointestinal: Negative for worsening diarrhea or loose stools, blood in stool, distention or other pain Genitourinary: Negative  for hematuria, flank pain or other change in urine volume.  Musculoskeletal: Negative for myalgias or other joint swelling.  Skin: Negative for other color change, or other wound or worsening drainage.  Neurological: Negative for other syncope or numbness. Hematological: Negative for other adenopathy or swelling Psychiatric/Behavioral: Negative for hallucinations, other worsening agitation, SI, self-injury, or new decreased concentration All other system neg per pt     Objective:   Physical Exam BP 128/76   Pulse 75   Temp 98.3 F (36.8 C) (Oral)   Ht 5\' 6"  (1.676 m)   Wt 190 lb (86.2 kg)   SpO2 98%   BMI 30.67 kg/m  VS noted,  Constitutional: Pt is oriented to person, place, and time. Appears well-developed and well-nourished, in no significant distress and comfortable Head: Normocephalic and atraumatic  Eyes: Conjunctivae and EOM are normal. Pupils are equal, round, and reactive to light Right Ear: External ear normal without discharge Left Ear: External ear normal without discharge Nose: Nose without discharge or deformity Mouth/Throat: Oropharynx is without other ulcerations and moist  Neck: Normal range of motion. Neck supple. No JVD present. No tracheal deviation present or significant neck LA or mass Cardiovascular: Normal rate, regular rhythm, normal heart sounds and intact distal pulses.   Pulmonary/Chest: WOB normal and breath sounds without rales or wheezing  Abdominal: Soft. Bowel sounds are normal. NT. No HSM  Musculoskeletal: Normal range of motion. Exhibits no edema, right dorsal foot with just over 1 cm area cystic subq swelling I suspect assoc with the 3rd ray tendon, nontender and no overlying skin change; also left lateral ankle with sting site now healed with some dark discoloration, and minor tender with 1+ swelling non discrete at least 3 or more cm lateral ankle and leg, no ulcer or erythema Lymphadenopathy: Has no other cervical adenopathy.  Neurological: Pt is alert and oriented to person, place, and time. Pt has normal reflexes. No cranial nerve deficit. Motor grossly intact, Gait intact Skin: Skin is warm and dry. No rash noted or new ulcerations Psychiatric:  Has normal mood and affect. Behavior is normal without agitation No other exam findings Lab Results  Component Value Date   WBC 7.0 05/13/2017   HGB 9.1 (L) 05/13/2017   HCT 28.7 (L) 05/13/2017   PLT 338.0 05/13/2017   GLUCOSE 104 (H) 05/13/2017   CHOL 156  05/13/2017   TRIG 55.0 05/13/2017   HDL 64.90 05/13/2017   LDLCALC 80 05/13/2017   ALT 10 05/13/2017   AST 15 05/13/2017   NA 136 05/13/2017   K 3.3 (L) 05/13/2017   CL 102 05/13/2017   CREATININE 0.91 05/13/2017   BUN 10 05/13/2017   CO2 28 05/13/2017   TSH 0.83 05/13/2017   HGBA1C 5.8 02/22/2009       Assessment & Plan:

## 2019-07-12 NOTE — Telephone Encounter (Signed)
Informed patient of the need to go to ED to receive blood transfusion.  Patient request return phone call to [phone] Patient states she will call Dr. Henrene Pastor. But request a phone call from Dr. Jenny Reichmann to [phone]

## 2019-07-12 NOTE — Assessment & Plan Note (Signed)
Has been significant in past, for f/u iron today with cbc

## 2019-07-12 NOTE — Assessment & Plan Note (Signed)
With minor symptoms and non tender, I think can be followed for now, but should see podiatry for worsening size and/or pain

## 2019-07-13 ENCOUNTER — Telehealth: Payer: Self-pay

## 2019-07-13 NOTE — Telephone Encounter (Signed)
Pt has viewed results via MyChart  

## 2019-07-13 NOTE — Telephone Encounter (Signed)
-----   Message from [name], MD sent at 07/12/2019  1:25 PM EDT ----- Left message on MyChart, pt to cont same tx except  The test results show that your current treatment is OK, as the tests are stable, except for the finding of very severe iron deficiency anemia.  I have already asked my staff to direct you to the ED for consideration of transfusion.  Please also start Nu-iron 1 per day, and get back to Gastroenterology Dr Henrene Pastor again as in 2016 for further consideration.  I will place the prescription, and hopefully you will hear soon about Dr Henrene Pastor.  Shirron to please inform pt, I will do referral and rx

## 2019-07-17 ENCOUNTER — Encounter: Payer: Self-pay | Admitting: Internal Medicine

## 2019-07-17 ENCOUNTER — Encounter (HOSPITAL_BASED_OUTPATIENT_CLINIC_OR_DEPARTMENT_OTHER): Payer: Self-pay | Admitting: Emergency Medicine

## 2019-07-17 ENCOUNTER — Other Ambulatory Visit: Payer: Self-pay

## 2019-07-17 ENCOUNTER — Emergency Department (HOSPITAL_BASED_OUTPATIENT_CLINIC_OR_DEPARTMENT_OTHER)
Admission: EM | Admit: 2019-07-17 | Discharge: 2019-07-17 | Disposition: A | Discharge status: Home / Self Care | Payer: BC Managed Care – PPO | Attending: Emergency Medicine | Admitting: Emergency Medicine

## 2019-07-17 ENCOUNTER — Other Ambulatory Visit: Payer: Self-pay | Admitting: Internal Medicine

## 2019-07-17 DIAGNOSIS — I10 Essential (primary) hypertension: Secondary | ICD-10-CM | POA: Insufficient documentation

## 2019-07-17 DIAGNOSIS — Z79899 Other long term (current) drug therapy: Secondary | ICD-10-CM | POA: Insufficient documentation

## 2019-07-17 DIAGNOSIS — D509 Iron deficiency anemia, unspecified: Secondary | ICD-10-CM

## 2019-07-17 DIAGNOSIS — R7989 Other specified abnormal findings of blood chemistry: Secondary | ICD-10-CM | POA: Diagnosis present

## 2019-07-17 LAB — CBC WITH DIFFERENTIAL/PLATELET
Abs Immature Granulocytes: 0 10*3/uL (ref 0.00–0.07)
Basophils Absolute: 0 10*3/uL (ref 0.0–0.1)
Basophils Relative: 1 %
Eosinophils Absolute: 0.1 10*3/uL (ref 0.0–0.5)
Eosinophils Relative: 3 %
HCT: 23.8 % — ABNORMAL LOW (ref 36.0–46.0)
Hemoglobin: 6.6 g/dL — CL (ref 12.0–15.0)
Immature Granulocytes: 0 %
Lymphocytes Relative: 33 %
Lymphs Abs: 1.3 10*3/uL (ref 0.7–4.0)
MCH: 18.2 pg — ABNORMAL LOW (ref 26.0–34.0)
MCHC: 27.7 g/dL — ABNORMAL LOW (ref 30.0–36.0)
MCV: 65.6 fL — ABNORMAL LOW (ref 80.0–100.0)
Monocytes Absolute: 0.5 10*3/uL (ref 0.1–1.0)
Monocytes Relative: 12 %
Neutro Abs: 1.9 10*3/uL (ref 1.7–7.7)
Neutrophils Relative %: 51 %
Platelets: 287 10*3/uL (ref 150–400)
RBC: 3.63 MIL/uL — ABNORMAL LOW (ref 3.87–5.11)
RDW: 21.2 % — ABNORMAL HIGH (ref 11.5–15.5)
WBC: 3.8 10*3/uL — ABNORMAL LOW (ref 4.0–10.5)
nRBC: 0 % (ref 0.0–0.2)

## 2019-07-17 MED ORDER — IRON (FERROUS SULFATE) 325 (65 FE) MG PO TABS: 1.0000 | ORAL_TABLET | Freq: Three times a day (TID) | ORAL | 1 refills | Status: DC

## 2019-07-17 NOTE — Discharge Instructions (Addendum)
Continue iron as previously prescribed.  Return to the emergency department if you develop shortness of breath, chest pain, syncopal spells, or other new and concerning symptoms.

## 2019-07-17 NOTE — ED Triage Notes (Signed)
Pt sent here for low hemoglobin 6.7 and possible blood transfusion.

## 2019-07-17 NOTE — ED Notes (Signed)
Date and time results received: 07/17/19 1221   Test: Hgb Critical Value: 6.6  Name of Provider Notified: Delo Orders Received? Or Actions Taken?: no orders given

## 2019-07-17 NOTE — ED Provider Notes (Signed)
[facility] EMERGENCY DEPARTMENT Provider Note   CSN: [id number] Arrival date & time: 07/17/19  1036     History   Chief Complaint Chief Complaint  Patient presents with  . Abnormal Lab    HPI Katherine Tucker is a 48 y.o. female.     Patient is a 48 year old female with history of hypertension, anxiety, and GERD.  She presents today for evaluation of low blood counts.  She was seen by her primary doctor last week for a routine visit during which time she had blood work drawn.  She was called several days later and told that her iron was low and to begin taking an iron pill.  She was also told to come to the ER for a blood transfusion.  Patient states that she feels somewhat fatigued, however she denies any lightheadedness, chest pain, shortness of breath, or other symptoms.  She is basically asymptomatic.  She denies any black or bloody stools.  She denies any heavy vaginal bleeding with her periods.  The history is provided by the patient.  Abnormal Lab   Past Medical History:  Diagnosis Date  . Anxiety   . Chicken pox   . Depression   . Elevated prolactin level (HCC)    elevated in 2009. MRI brain normal  . GERD (gastroesophageal reflux disease)   . Headache(784.0)   . Hiatal hernia   . Hyperlipidemia   . Hypertension   . Mammary duct ectasia of right breast 2009   excision of duct - Dr. Davina Pokeornet 2010 - nonmalignant path  . Morbid obesity [facility](HCC)     Patient Active Problem List   Diagnosis Date Noted  . Wasp sting 07/12/2019  . Ganglion cyst of right foot 07/12/2019  . Iron deficiency anemia 07/07/2018  . Hyperglycemia 07/07/2018  . Rash 07/07/2018  . Recurrent syncope 11/04/2017  . Fatigue 05/13/2017  . Feels feverish 05/13/2017  . Allergic rhinitis 05/13/2017  . Weight loss 05/13/2017  . Vertigo 04/03/2016  . Right hand pain 08/30/2015  . Cough 08/30/2015  . OSA (obstructive sleep apnea) 07/27/2014  . Encounter for well adult exam with abnormal  findings 05/24/2014  . Influenza-like illness 12/05/2013  . Acute non-recurrent maxillary sinusitis 12/05/2013  . Nonsuppurative otitis media, not specified as acute or chronic 09/20/2013  . GERD (gastroesophageal reflux disease) 06/04/2013  . LAP-BAND surgery status 05/05/2012  . Herpes zoster without mention of complication 05/08/2008  . Hyperlipidemia 10/06/2007  . Morbid obesity (HCC) 10/06/2007  . ANXIETY 10/06/2007  . Depression 10/06/2007  . Essential hypertension 10/06/2007  . Headache(784.0) 10/06/2007    Past Surgical History:  Procedure Laterality Date  . BLADDER SURGERY     childhood  . breast lesion excison Right Jan 2010   Dr. Luisa Hartornett. Ductal ectasia  . lapband procedure  5-11   [facility]  . MOUTH SURGERY       OB History   No obstetric history on file.      Home Medications    Prior to Admission medications   Medication Sig Start Date End Date Taking? Authorizing Provider  Calcium 1500 MG tablet Take 1,500 mg by mouth daily.      [provider]  citalopram (CELEXA) 40 MG tablet TAKE 1 TABLET BY MOUTH EVERY DAY 07/12/19   Corwin LevinsJohn, James W, MD  [facility] ASPIRIN LOW DOSE 81 MG EC tablet TAKE 1 TABLET (81 MG TOTAL) BY MOUTH DAILY. SWALLOW WHOLE. 12/27/18   Corwin LevinsJohn, James W, MD  EPINEPHrine 0.3 mg/0.3 mL  IJ SOAJ injection Inject 0.3 mLs (0.3 mg total) into the muscle as needed for anaphylaxis. 07/12/19   [name], MD  famotidine (PEPCID) 20 MG tablet Take 1 tablet (20 mg total) by mouth 2 (two) times daily. 07/12/19   [name], MD  iron polysaccharides (NU-IRON) 150 MG capsule Take 1 capsule (150 mg total) by mouth daily. 07/12/19   [name], MD  metoprolol tartrate (LOPRESSOR) 25 MG tablet TAKE 1 TABLET (25 MG TOTAL) BY MOUTH 2 (TWO) TIMES DAILY. 07/12/19   [name], MD  multivitamin [facility]) tablet Take 1 tablet by mouth daily. Centrum [place].    [provider]  rosuvastatin (CRESTOR) 10 MG tablet Take 1 tablet (10 mg total) by mouth daily.  07/12/19   [name], MD    Family History Family History  Problem Relation Age of Onset  . Arthritis Mother   . Hypertension Mother   . Hyperlipidemia Mother   . Hypertension Father   . Hyperlipidemia Father   . Cancer Paternal Aunt        breast  . Diabetes Other     Social History Social History   Tobacco Use  . Smoking status: Never Smoker  . Smokeless tobacco: Never Used  Substance Use Topics  . Alcohol use: No  . Drug use: No     Allergies   Contrast media [iodinated diagnostic agents]   Review of Systems Review of Systems  All other systems reviewed and are negative.    Physical Exam Updated Vital Signs BP 112/74   Pulse 65   Temp 98.3 F (36.8 C) (Oral)   Resp 16   Ht 5\' 6"  (1.676 m)   Wt 86.1 kg   LMP 07/03/2019   SpO2 100%   BMI 30.63 kg/m   Physical Exam Vitals signs and nursing note reviewed.  Constitutional:      General: She is not in acute distress.    Appearance: Normal appearance. She is not ill-appearing.  HENT:     Head: Normocephalic and atraumatic.  Pulmonary:     Effort: Pulmonary effort is normal.  Musculoskeletal: Normal range of motion.  Skin:    General: Skin is warm and dry.  Neurological:     Mental Status: She is alert and oriented to person, place, and time.      ED Treatments / Results  Labs (all labs ordered are listed, but only abnormal results are displayed) Labs Reviewed  CBC WITH DIFFERENTIAL/PLATELET    EKG None  Radiology No results found.  Procedures Procedures (including critical care time)  Medications Ordered in ED Medications - No data to display   Initial Impression / Assessment and Plan / ED Course  I have reviewed the triage vital signs and the nursing notes.  Pertinent labs & imaging results that were available during my care of the patient were reviewed by me and considered in my medical decision making (see chart for details).  Patient with history of iron deficiency  anemia sent here for possible blood transfusion.  Last week, her hemoglobin was 6.7.  Today it is 6.6 and patient is basically asymptomatic.  She is not having any lightheadedness, syncope, shortness of breath, or chest pain.  Patient would like to forego a blood transfusion unless absolutely necessary.  At this point, I feel as though it is appropriate for her to go home and continue her iron tablets.  She should have a CBC repeated in the next 1 to  2 weeks.  She understands to go to the ER at [facility] if she experiences additional issues.  Final Clinical Impressions(s) / ED Diagnoses   Final diagnoses:  None    ED Discharge Orders    None       Geoffery Lyonselo, Ozzie Remmers, MD 07/17/19 1229

## 2019-07-17 NOTE — ED Notes (Signed)
Pt verbalized understanding of dc instructions.

## 2019-08-07 ENCOUNTER — Telehealth: Payer: Self-pay

## 2019-08-07 ENCOUNTER — Ambulatory Visit: Payer: BC Managed Care – PPO | Admitting: Internal Medicine

## 2019-08-07 ENCOUNTER — Encounter: Payer: Self-pay | Admitting: Internal Medicine

## 2019-08-07 VITALS — BP 90/70 | HR 80 | Temp 98.3°F | Ht 65.0 in | Wt 190.1 lb

## 2019-08-07 DIAGNOSIS — K219 Gastro-esophageal reflux disease without esophagitis: Secondary | ICD-10-CM

## 2019-08-07 DIAGNOSIS — D500 Iron deficiency anemia secondary to blood loss (chronic): Secondary | ICD-10-CM | POA: Diagnosis not present

## 2019-08-07 DIAGNOSIS — Z9884 Bariatric surgery status: Secondary | ICD-10-CM | POA: Diagnosis not present

## 2019-08-07 MED ORDER — NA SULFATE-K SULFATE-MG SULF 17.5-3.13-1.6 GM/177ML PO SOLN: 1.0000 | Freq: Once | ORAL | 0 refills | Status: AC

## 2019-08-07 MED ORDER — PANTOPRAZOLE SODIUM 40 MG PO TBEC: 40.0000 mg | DELAYED_RELEASE_TABLET | Freq: Every day | ORAL | 3 refills | Status: DC

## 2019-08-07 NOTE — Patient Instructions (Signed)

## 2019-08-07 NOTE — Progress Notes (Signed)
HISTORY OF PRESENT ILLNESS:  Katherine Tucker is a 48 y.o. female, district returning, he was sent today by her primary care provider Dr. Jonny RuizJohn regarding recurrent iron deficiency anemia.  Patient has a history of morbid obesity for which she is status post lap band procedure in May 2011.  She also has a history of chronic reflux symptoms responding best to proton pump inhibitor therapy.  I last saw the patient August 20, 2015 regarding iron deficiency anemia.  Hemoglobin at that time was 8.4 with MCV 71.1.  At that time she denied being a blood donor.  Her menstrual periods were described as regular but not heavy.  She subsequently underwent both colonoscopy and upper endoscopy October 11, 2015.  Colonoscopy including intubation of the terminal ileum was normal except for incidental cecal diverticulum.  Routine follow-up in 10 years recommended.  Upper endoscopy revealed a dilated fluid-filled esophagus and a 4 cm gastric pouch above the LAP-BAND.  Mild esophagitis.  No other abnormalities.  She does tell me that she did have some of her fluid reduced from her lap band by Dr. [facility].  She was to stay on iron supplements indefinitely as long as she was having menstrual periods.  However she states she did not tolerate iron particularly well on an empty stomach.  Her hemoglobin in 2016 was 12.  In 2017 it was 11.  In 2018 it was 9.1.  And in August 2020 was 6.7.  Low MCV.  Iron studies consistent with iron deficiency.  She tells me that she has been tired, cold,.  She denies melena or hematochezia.  She still has regular menstrual periods described as normal flow.  Interestingly, she tells me that her father has a history of has a history of iron deficiency.  She did contact them this afternoon.  He was not certain of the particular cause.  In any event, patient does tell me she is having active reflux symptoms on Pepcid.  Occasional issues with nausea and vomiting.  GI review of systems otherwise  negative.  REVIEW OF SYSTEMS:  All non-GI ROS negative unless otherwise stated in the HPI except for sinus and allergy trouble, headaches, night sweats, sleeping problems  Past Medical History:  Diagnosis Date  . Anxiety   . Chicken pox   . Depression   . Elevated prolactin level (HCC)    elevated in 2009. MRI brain normal  . GERD (gastroesophageal reflux disease)   . Headache(784.0)   . Hiatal hernia   . Hyperlipidemia   . Hypertension   . IDA (iron deficiency anemia)   . Mammary duct ectasia of right breast 2009   excision of duct - Dr. Davina Pokeornet 2010 - nonmalignant path  . Morbid obesity (HCC)   . Sleep apnea with use of continuous positive airway pressure (CPAP)     Past Surgical History:  Procedure Laterality Date  . BLADDER SURGERY     childhood  . breast lesion excison Right Jan 2010   Dr. Luisa Hartornett. Ductal ectasia  . lapband procedure  5-11   [facility]  . MOUTH SURGERY      Social History Katherine KiefKelly R Tucker  reports that she has never smoked. She has never used smokeless tobacco. She reports current alcohol use. She reports that she does not use drugs.  family history includes Arthritis in her mother; Breast cancer in her paternal aunt; Diabetes in her father, mother, and paternal grandmother; Hyperlipidemia in her father and mother; Hypertension in her father and mother; Prostate  cancer in her father.  Allergies  Allergen Reactions  . Contrast Media [Iodinated Diagnostic Agents] Nausea And Vomiting    Severe nausea and vomiting       PHYSICAL EXAMINATION: Vital signs: BP 90/70 (BP Location: Left Arm, Patient Position: Sitting, Cuff Size: Normal)   Pulse 80   Temp 98.3 F (36.8 C)   Ht 5\' 5"  (1.651 m) Comment: height measured without shoes  Wt 190 lb 2 oz (86.2 kg)   LMP 07/24/2019   BMI 31.64 kg/m   Constitutional: generally well-appearing, no acute distress Psychiatric: alert and oriented x3, cooperative Eyes: extraocular movements intact, anicteric,  conjunctiva pink Mouth: oral pharynx moist, no lesions Neck: supple no lymphadenopathy Cardiovascular: heart regular rate and rhythm, no murmur Lungs: clear to auscultation bilaterally Abdomen: soft, nontender, nondistended, no obvious ascites, no peritoneal signs, normal bowel sounds, no organomegaly Rectal: Deferred until colonoscopy Extremities: no clubbing or cyanosis.  Trace lower extremity edema bilaterally Skin: no lesions on visible extremities Neuro: No focal deficits.  Cranial nerves intact  ASSESSMENT:  1.  Recurrent iron deficiency anemia.  GI work-up 2016 as described.  Possibly related to menstrual loss.  Rule out interval concurrent GI cause. 2.  GERD.  Active symptoms on H2 receptor antagonist therapy 3.  Obesity.  Status post lap band procedure 2011 with Dr. Excell Seltzer 4.  General medical problems.  Stable  PLAN:  1.  Schedule colonoscopy to evaluate iron deficiency anemia.The nature of the procedure, as well as the risks, benefits, and alternatives were carefully and thoroughly reviewed with the patient. Ample time for discussion and questions allowed. The patient understood, was satisfied, and agreed to proceed. 2.  Schedule upper endoscopy to evaluate iron deficiency anemia.The nature of the procedure, as well as the risks, benefits, and alternatives were carefully and thoroughly reviewed with the patient. Ample time for discussion and questions allowed. The patient understood, was satisfied, and agreed to proceed. 3.  Take prescribed iron supplement twice daily 4.  Prescribe pantoprazole 40 mg daily for GERD 5.  Reflux precautions 6.  Ongoing general medical care with Dr. Jenny Reichmann

## 2019-08-07 NOTE — Telephone Encounter (Signed)
Sent Protonix per Dr. Henrene Pastor.

## 2019-08-16 ENCOUNTER — Other Ambulatory Visit: Payer: Self-pay | Admitting: Internal Medicine

## 2019-08-17 ENCOUNTER — Other Ambulatory Visit: Payer: Self-pay | Admitting: Internal Medicine

## 2019-08-22 ENCOUNTER — Other Ambulatory Visit: Payer: Self-pay | Admitting: Internal Medicine

## 2019-08-25 ENCOUNTER — Encounter: Payer: Self-pay | Admitting: Internal Medicine

## 2019-09-05 ENCOUNTER — Telehealth: Payer: Self-pay

## 2019-09-05 NOTE — Telephone Encounter (Signed)
Covid-19 screening questions   Do you now or have you had a fever in the last 14 days?  Do you have any respiratory symptoms of shortness of breath or cough now or in the last 14 days?  Do you have any family members or close contacts with diagnosed or suspected Covid-19 in the past 14 days?  Have you been tested for Covid-19 and found to be positive?       

## 2019-09-06 ENCOUNTER — Encounter: Payer: Self-pay | Admitting: Internal Medicine

## 2019-09-06 ENCOUNTER — Other Ambulatory Visit: Payer: Self-pay

## 2019-09-06 ENCOUNTER — Other Ambulatory Visit (INDEPENDENT_AMBULATORY_CARE_PROVIDER_SITE_OTHER): Payer: BC Managed Care – PPO

## 2019-09-06 ENCOUNTER — Ambulatory Visit (AMBULATORY_SURGERY_CENTER): Payer: BC Managed Care – PPO | Admitting: Internal Medicine

## 2019-09-06 VITALS — BP 124/83 | HR 63 | Temp 99.1°F | Resp 16 | Ht 65.0 in | Wt 190.0 lb

## 2019-09-06 DIAGNOSIS — K219 Gastro-esophageal reflux disease without esophagitis: Secondary | ICD-10-CM

## 2019-09-06 DIAGNOSIS — D509 Iron deficiency anemia, unspecified: Secondary | ICD-10-CM

## 2019-09-06 DIAGNOSIS — D500 Iron deficiency anemia secondary to blood loss (chronic): Secondary | ICD-10-CM

## 2019-09-06 DIAGNOSIS — K209 Esophagitis, unspecified: Secondary | ICD-10-CM | POA: Diagnosis not present

## 2019-09-06 LAB — CBC WITH DIFFERENTIAL/PLATELET
Basophils Absolute: 0 10*3/uL (ref 0.0–0.1)
Basophils Relative: 0.6 % (ref 0.0–3.0)
Eosinophils Absolute: 0.1 10*3/uL (ref 0.0–0.7)
Eosinophils Relative: 2.2 % (ref 0.0–5.0)
HCT: 31.4 % — ABNORMAL LOW (ref 36.0–46.0)
Hemoglobin: 9.5 g/dL — ABNORMAL LOW (ref 12.0–15.0)
Lymphocytes Relative: 37.7 % (ref 12.0–46.0)
Lymphs Abs: 1.1 10*3/uL (ref 0.7–4.0)
MCHC: 30.4 g/dL (ref 30.0–36.0)
MCV: 70.7 fl — ABNORMAL LOW (ref 78.0–100.0)
Monocytes Absolute: 0.3 10*3/uL (ref 0.1–1.0)
Monocytes Relative: 10.6 % (ref 3.0–12.0)
Neutro Abs: 1.4 10*3/uL (ref 1.4–7.7)
Neutrophils Relative %: 48.9 % (ref 43.0–77.0)
Platelets: 227 10*3/uL (ref 150.0–400.0)
RBC: 4.44 Mil/uL (ref 3.87–5.11)
RDW: 29.7 % — ABNORMAL HIGH (ref 11.5–15.5)
WBC: 2.8 10*3/uL — ABNORMAL LOW (ref 4.0–10.5)

## 2019-09-06 LAB — FERRITIN: Ferritin: 3.6 ng/mL — ABNORMAL LOW (ref 10.0–291.0)

## 2019-09-06 MED ORDER — SODIUM CHLORIDE 0.9 % IV SOLN: 500.0000 mL | Freq: Once | INTRAVENOUS | Status: DC

## 2019-09-06 NOTE — Op Note (Signed)
[facility] Patient Name: Katherine Tucker Procedure Date: 09/06/2019 9:50 AM MRN: [id number] Endoscopist: Wilhemina Bonito. Marina Goodell , MD Age: 48 Referring MD:  Date of Birth: 06-14-1971 Gender: Female Account #: [id number] Procedure:                Upper GI endoscopy Indications:              Iron deficiency anemia. Work-up for the same 2016 Medicines:                Monitored Anesthesia Care Procedure:                Pre-Anesthesia Assessment:                           - Prior to the procedure, a History and Physical                            was performed, and patient medications and                            allergies were reviewed. The patient's tolerance of                            previous anesthesia was also reviewed. The risks                            and benefits of the procedure and the sedation                            options and risks were discussed with the patient.                            All questions were answered, and informed consent                            was obtained. Prior Anticoagulants: The patient has                            taken no previous anticoagulant or antiplatelet                            agents. ASA Grade Assessment: II - A patient with                            mild systemic disease. After reviewing the risks                            and benefits, the patient was deemed in                            satisfactory condition to undergo the procedure.                           After obtaining informed consent, the endoscope was  passed under direct vision. Throughout the                            procedure, the patient's blood pressure, pulse, and                            oxygen saturations were monitored continuously. The                            Endoscope was introduced through the mouth, and                            advanced to the second part of duodenum. The upper                            GI  endoscopy was accomplished without difficulty.                            The patient tolerated the procedure well. Scope In: Scope Out: Findings:                 The esophagus was was diffusely dilated with                            significant amounts of fluid and a retained pill.                            As well esophagitis in the region of the                            gastroesophageal junction.                           The stomach revealed evidence of prior lap band                            surgery. The gastric pouch was approximately 3 to 4                            cm. The band itself appeared tight, requiring                            significant pressure to enter the stomach. Stomach                            was otherwise normal with evidence of the band                            without complication such as ulceration, on                            retroflex view.                           The examined  duodenum was normal.                           The cardia and gastric fundus were as described                            above on retroflexion. Complications:            No immediate complications. Estimated Blood Loss:     Estimated blood loss: none. Impression:               1. Dilated esophagus with retained fluid and feels                            secondary to tight lap band                           2. Status post lap band surgery with anatomy as                            described.                           3. Suspect iron deficiency anemia secondary to                            chronic menstrual blood loss Recommendation:           1. Continue iron supplementation daily                           2. Obtain CBC and ferritin level today                           3. If you do not tolerate oral iron or cannot                            achieve adequate iron replacement orally, then                            consider IV iron infusion therapy under the                             direction of your primary provider Dr. Jonny Ruiz                           4. If you are having any issues with swallowing or                            regurgitation, contact your bariatric surgeon to                            remove some fluid from your LAP-BAND as this may                            help  5. Return to the care of your primary provider. GI                            follow-up as needed. Docia Chuck. Henrene Pastor, MD 09/06/2019 10:40:11 AM This report has been signed electronically.

## 2019-09-06 NOTE — Progress Notes (Signed)
approx 1014, the pt, with no warning signs starts spitting out mouths full of water looking fluid thru her mouth piece.  Head immediately dropped, suction applied, sedation stopped.  Couple minutes and pt is awake enogh to swallow and clear own airway.  Over 200cc of the clear fluid was captured in the suction cannister but the towel, pillow case and side of bed was soaked (all changed).  Pt got no more sedation until Dr Henrene Pastor ready with scope in hand to do endo.  Pt was found to be s/p lap band with a very tight band and dilated esoph (which contained more fluid and a pill).  Pt allowed to wake up in procedure room and incident was reported to pt an PACU RN.  Sats 100 and BS clear in PACU

## 2019-09-06 NOTE — Patient Instructions (Signed)
We will take you to the lab today prior to leaving.    IF YOU ARE HAVING ANY ISSUES WITH SWALLOWING OR REGURGITATION, CONTACT YOUR BARIATRIC SURGEON TO REMOVE FLUID FROM YOUR LAP-BAND AS THIS MAY HELP.  YOU HAD AN ENDOSCOPIC PROCEDURE TODAY AT [facility]:   Refer to the procedure report that was given to you for any specific questions about what was found during the examination.  If the procedure report does not answer your questions, please call your gastroenterologist to clarify.  If you requested that your care partner not be given the details of your procedure findings, then the procedure report has been included in a sealed envelope for you to review at your convenience later.  YOU SHOULD EXPECT: Some feelings of bloating in the abdomen. Passage of more gas than usual.  Walking can help get rid of the air that was put into your GI tract during the procedure and reduce the bloating. If you had a lower endoscopy (such as a colonoscopy or flexible sigmoidoscopy) you may notice spotting of blood in your stool or on the toilet paper. If you underwent a bowel prep for your procedure, you may not have a normal bowel movement for a few days.  Please Note:  You might notice some irritation and congestion in your nose or some drainage.  This is from the oxygen used during your procedure.  There is no need for concern and it should clear up in a day or so.  SYMPTOMS TO REPORT IMMEDIATELY:   Following lower endoscopy (colonoscopy or flexible sigmoidoscopy):  Excessive amounts of blood in the stool  Significant tenderness or worsening of abdominal pains  Swelling of the abdomen that is new, acute  Fever of 100F or higher   Following upper endoscopy (EGD)  Vomiting of blood or coffee ground material  New chest pain or pain under the shoulder blades  Painful or persistently difficult swallowing  New shortness of breath  Fever of 100F or higher  Black, tarry-looking stools  For  urgent or emergent issues, a gastroenterologist can be reached at any hour by calling [phone].   DIET:  We do recommend a small meal at first, but then you may proceed to your regular diet.  Drink plenty of fluids but you should avoid alcoholic beverages for 24 hours.  ACTIVITY:  You should plan to take it easy for the rest of today and you should NOT DRIVE or use heavy machinery until tomorrow (because of the sedation medicines used during the test).    FOLLOW UP: Our staff will call the number listed on your records 48-72 hours following your procedure to check on you and address any questions or concerns that you may have regarding the information given to you following your procedure. If we do not reach you, we will leave a message.  We will attempt to reach you two times.  During this call, we will ask if you have developed any symptoms of COVID 19. If you develop any symptoms (ie: fever, flu-like symptoms, shortness of breath, cough etc.) before then, please call [phone].  If you test positive for Covid 19 in the 2 weeks post procedure, please call and report this information to [place].    If any biopsies were taken you will be contacted by phone or by letter within the next 1-3 weeks.  Please call us at [phone] if you have not heard about the biopsies in 3 weeks.    SIGNATURES/CONFIDENTIALITY:  You and/or your care partner have signed paperwork which will be entered into your electronic medical record.  These signatures attest to the fact that that the information above on your After Visit Summary has been reviewed and is understood.  Full responsibility of the confidentiality of this discharge information lies with you and/or your care-partner.

## 2019-09-06 NOTE — Progress Notes (Signed)
Pt's states no medical or surgical changes since previsit or office visit.  KA - temp CW - vitals 

## 2019-09-06 NOTE — Op Note (Signed)
[place] Patient Name: Katherine Tucker Procedure Date: 09/06/2019 9:50 AM MRN: [id number] Endoscopist: Docia Chuck. Henrene Pastor , MD Age: 48 Referring MD:  Date of Birth: 1970-12-29 Gender: Female Account #: [id number] Procedure:                Colonoscopy Indications:              Iron deficiency anemia. Prior work-up for the same                            2016 Medicines:                Monitored Anesthesia Care Procedure:                Pre-Anesthesia Assessment:                           - Prior to the procedure, a History and Physical                            was performed, and patient medications and                            allergies were reviewed. The patient's tolerance of                            previous anesthesia was also reviewed. The risks                            and benefits of the procedure and the sedation                            options and risks were discussed with the patient.                            All questions were answered, and informed consent                            was obtained. Prior Anticoagulants: The patient has                            taken no previous anticoagulant or antiplatelet                            agents. ASA Grade Assessment: II - A patient with                            mild systemic disease. After reviewing the risks                            and benefits, the patient was deemed in                            satisfactory condition to undergo the procedure.  After obtaining informed consent, the colonoscope                            was passed under direct vision. Throughout the                            procedure, the patient's blood pressure, pulse, and                            oxygen saturations were monitored continuously. The                            Colonoscope was introduced through the anus and                            advanced to the the cecum, identified by                             appendiceal orifice and ileocecal valve. The                            ileocecal valve, appendiceal orifice, and rectum                            were photographed. The quality of the bowel                            preparation was good. The colonoscopy was performed                            without difficulty. The patient tolerated the                            procedure well. The bowel preparation used was                            SUPREP via split dose instruction. Scope In: 10:05:25 AM Scope Out: 10:20:45 AM Scope Withdrawal Time: 0 hours 13 minutes 5 seconds  Total Procedure Duration: 0 hours 15 minutes 20 seconds  Findings:                 The entire examined colon appeared normal on direct                            and retroflexion views. Complications:            No immediate complications. Estimated blood loss:                            None. Estimated Blood Loss:     Estimated blood loss: none. Impression:               - The entire examined colon is normal on direct and  retroflexion views.                           - No specimens collected. Recommendation:           - Repeat colonoscopy in 10 years for screening                            purposes.                           - Patient has a contact number available for                            emergencies. The signs and symptoms of potential                            delayed complications were discussed with the                            patient. Return to normal activities tomorrow.                            Written discharge instructions were provided to the                            patient.                           - Resume previous diet.                           - Continue present medications.                           - EGD today. Please see report regarding findings                            and final recommendations  N. Marina Goodell, MD 09/06/2019 10:32:41  AM This report has been signed electronically.

## 2019-09-08 ENCOUNTER — Telehealth: Payer: Self-pay

## 2019-09-08 NOTE — Telephone Encounter (Signed)
Attempted to reach patient for post-procedure f/u call. No answer. Left message that we will make another attempt to reach her again later today and for her to please not hesitate to call us if she has any questions/concerns regarding her care. 

## 2019-09-08 NOTE — Telephone Encounter (Signed)
  Follow up Call-  Call back number 09/06/2019  Post procedure Call Back phone  # [phone]  Permission to leave phone message Yes  Some recent data might be hidden     Patient questions:  Do you have a fever, pain , or abdominal swelling? No. Pain Score  0 *  Have you tolerated food without any problems? Yes.    Have you been able to return to your normal activities? Yes.    Do you have any questions about your discharge instructions: Diet   No. Medications  No. Follow up visit  No.  Do you have questions or concerns about your Care? No.  Actions: * If pain score is 4 or above: No action needed, pain <4.

## 2019-09-12 ENCOUNTER — Other Ambulatory Visit: Payer: Self-pay | Admitting: Internal Medicine

## 2019-09-12 DIAGNOSIS — D509 Iron deficiency anemia, unspecified: Secondary | ICD-10-CM

## 2019-11-22 ENCOUNTER — Other Ambulatory Visit: Payer: Self-pay

## 2019-11-22 ENCOUNTER — Ambulatory Visit: Payer: BC Managed Care – PPO | Attending: Internal Medicine

## 2019-11-22 DIAGNOSIS — Z20822 Contact with and (suspected) exposure to covid-19: Secondary | ICD-10-CM

## 2019-11-24 LAB — NOVEL CORONAVIRUS, NAA: SARS-CoV-2, NAA: NOT DETECTED

## 2019-12-06 ENCOUNTER — Other Ambulatory Visit: Payer: Self-pay | Admitting: Internal Medicine

## 2019-12-15 ENCOUNTER — Ambulatory Visit: Payer: BC Managed Care – PPO | Attending: Internal Medicine

## 2019-12-15 DIAGNOSIS — Z20822 Contact with and (suspected) exposure to covid-19: Secondary | ICD-10-CM

## 2019-12-16 LAB — NOVEL CORONAVIRUS, NAA: SARS-CoV-2, NAA: NOT DETECTED

## 2020-01-26 ENCOUNTER — Ambulatory Visit: Payer: BC Managed Care – PPO | Attending: Internal Medicine

## 2020-01-26 DIAGNOSIS — Z23 Encounter for immunization: Secondary | ICD-10-CM | POA: Insufficient documentation

## 2020-01-26 NOTE — Progress Notes (Signed)
   Covid-19 Vaccination Clinic  Name:  Katherine Tucker    MRN: [id number] DOB: 11-24-1971  01/26/2020  [name] was observed post Covid-19 immunization for 15 minutes without incidence. She was provided with Vaccine Information Sheet and instruction to access the V-Safe system.   [name] was instructed to call 911 with any severe reactions post vaccine: Marland Kitchen Difficulty breathing  . Swelling of your face and throat  . A fast heartbeat  . A bad rash all over your body  . Dizziness and weakness    Immunizations Administered    Name Date Dose VIS Date Route   Pfizer COVID-19 Vaccine 01/26/2020 12:09 PM 0.3 mL 11/17/2019 Intramuscular   Manufacturer: [employer], [employer]   Lot: [id number]   NDC: [address]-2

## 2020-02-20 ENCOUNTER — Ambulatory Visit: Payer: BC Managed Care – PPO | Attending: Internal Medicine

## 2020-02-20 DIAGNOSIS — Z23 Encounter for immunization: Secondary | ICD-10-CM

## 2020-02-20 NOTE — Progress Notes (Signed)
   Covid-19 Vaccination Clinic  Name:  SAINA WAAGE    MRN: [id number] DOB: July 19, 1971  02/20/2020  Ms. Winningham was observed post Covid-19 immunization for 15 minutes without incident. She was provided with Vaccine Information Sheet and instruction to access the V-Safe system.   Ms. Helman was instructed to call 911 with any severe reactions post vaccine: Marland Kitchen Difficulty breathing  . Swelling of face and throat  . A fast heartbeat  . A bad rash all over body  . Dizziness and weakness   Immunizations Administered    Name Date Dose VIS Date Route   Pfizer COVID-19 Vaccine 02/20/2020  8:33 AM 0.3 mL 11/17/2019 Intramuscular   Manufacturer: [employer], [employer]   Lot: [id number]   NDC: [address]-1

## 2020-07-09 ENCOUNTER — Other Ambulatory Visit: Payer: Self-pay | Admitting: Internal Medicine

## 2020-07-09 NOTE — Telephone Encounter (Signed)
Please refill as per office routine med refill policy (all routine meds refilled for 3 mo or monthly per pt preference up to one year from last visit, then month to month grace period for 3 mo, then further med refills will have to be denied)  

## 2020-07-11 ENCOUNTER — Encounter: Payer: Self-pay | Admitting: Internal Medicine

## 2020-07-12 MED ORDER — FLUCONAZOLE 150 MG PO TABS: ORAL_TABLET | ORAL | 1 refills | Status: DC

## 2020-07-16 ENCOUNTER — Other Ambulatory Visit: Payer: Self-pay

## 2020-07-16 ENCOUNTER — Encounter: Payer: Self-pay | Admitting: Internal Medicine

## 2020-07-16 ENCOUNTER — Ambulatory Visit (INDEPENDENT_AMBULATORY_CARE_PROVIDER_SITE_OTHER): Payer: BC Managed Care – PPO | Admitting: Internal Medicine

## 2020-07-16 VITALS — BP 110/78 | HR 68 | Temp 98.0°F | Ht 65.0 in | Wt 204.0 lb

## 2020-07-16 DIAGNOSIS — Z0000 Encounter for general adult medical examination without abnormal findings: Secondary | ICD-10-CM | POA: Diagnosis not present

## 2020-07-16 DIAGNOSIS — D500 Iron deficiency anemia secondary to blood loss (chronic): Secondary | ICD-10-CM | POA: Diagnosis not present

## 2020-07-16 DIAGNOSIS — Z1159 Encounter for screening for other viral diseases: Secondary | ICD-10-CM | POA: Diagnosis not present

## 2020-07-16 DIAGNOSIS — E611 Iron deficiency: Secondary | ICD-10-CM

## 2020-07-16 DIAGNOSIS — E559 Vitamin D deficiency, unspecified: Secondary | ICD-10-CM

## 2020-07-16 DIAGNOSIS — R739 Hyperglycemia, unspecified: Secondary | ICD-10-CM

## 2020-07-16 DIAGNOSIS — E538 Deficiency of other specified B group vitamins: Secondary | ICD-10-CM

## 2020-07-16 MED ORDER — METOPROLOL TARTRATE 25 MG PO TABS: ORAL_TABLET | ORAL | 3 refills | Status: DC

## 2020-07-16 MED ORDER — CITALOPRAM HYDROBROMIDE 40 MG PO TABS: 40.0000 mg | ORAL_TABLET | Freq: Every day | ORAL | 3 refills | Status: DC

## 2020-07-16 MED ORDER — ROSUVASTATIN CALCIUM 10 MG PO TABS: 10.0000 mg | ORAL_TABLET | Freq: Every day | ORAL | 3 refills | Status: DC

## 2020-07-16 MED ORDER — PANTOPRAZOLE SODIUM 40 MG PO TBEC: 40.0000 mg | DELAYED_RELEASE_TABLET | Freq: Every day | ORAL | 3 refills | Status: DC

## 2020-07-16 MED ORDER — FERREX 150 150 MG PO CAPS: 150.0000 mg | ORAL_CAPSULE | Freq: Every day | ORAL | 3 refills | Status: DC

## 2020-07-16 NOTE — Progress Notes (Signed)
Subjective:    Patient ID: Katherine Tucker, female    DOB: [birth year], 49 y.o.   MRN: [id number]  HPI  Here for wellness and f/u;  Overall doing ok;  Pt denies Chest pain, worsening SOB, DOE, wheezing, orthopnea, PND, worsening LE edema, palpitations, dizziness or syncope.  Pt denies neurological change such as new headache, facial or extremity weakness.  Pt denies polydipsia, polyuria, or low sugar symptoms. Pt states overall good compliance with treatment and medications, good tolerability, and has been trying to follow appropriate diet.  Pt denies worsening depressive symptoms, suicidal ideation or panic. No fever, night sweats, wt loss, loss of appetite, or other constitutional symptoms.  Pt states good ability with ADL's, has low fall risk, home safety reviewed and adequate, no other significant changes in hearing or vision, and only occasionally active with exercise. Due for GYN f/u tomorrow.  No overt bleeding Past Medical History:  Diagnosis Date  . Anxiety   . Chicken pox   . Depression   . Elevated prolactin level    elevated in 2009. MRI brain normal  . GERD (gastroesophageal reflux disease)   . Headache(784.0)   . Hiatal hernia   . Hyperlipidemia   . Hypertension   . IDA (iron deficiency anemia)   . Mammary duct ectasia of right breast 2009   excision of duct - Dr. Davina Poke 2010 - nonmalignant path  . Morbid obesity (HCC)   . Sleep apnea with use of continuous positive airway pressure (CPAP)    Past Surgical History:  Procedure Laterality Date  . BLADDER SURGERY     childhood  . breast lesion excison Right Jan 2010   Dr. Luisa Hart. Ductal ectasia  . lapband procedure  5-11   [facility]  . MOUTH SURGERY      reports that she has never smoked. She has never used smokeless tobacco. She reports current alcohol use. She reports that she does not use drugs. family history includes Arthritis in her mother; Breast cancer in her paternal aunt; Diabetes in her father, mother, and  paternal grandmother; Hyperlipidemia in her father and mother; Hypertension in her father and mother; Prostate cancer in her father. Allergies  Allergen Reactions  . Contrast Media [Iodinated Diagnostic Agents] Nausea And Vomiting    Severe nausea and vomiting   Current Outpatient Medications on File Prior to Visit  Medication Sig Dispense Refill  . Calcium 1500 MG tablet Take 1,500 mg by mouth daily.      . [employer] ASPIRIN LOW DOSE 81 MG EC tablet TAKE 1 TABLET (81 MG TOTAL) BY MOUTH DAILY. SWALLOW WHOLE. 90 tablet 1  . fluconazole (DIFLUCAN) 150 MG tablet 1 tab by mouth every 3 days as needed 2 tablet 1  . multivitamin (THERAGRAN) tablet Take 1 tablet by mouth daily. Centrum [place].     No current facility-administered medications on file prior to visit.   Review of Systems All otherwise neg per pt    Objective:   Physical Exam BP 110/78 (BP Location: Left Arm, Patient Position: Sitting, Cuff Size: Large)   Pulse 68   Temp 98 F (36.7 C) (Oral)   Ht 5\' 5"  (1.651 m)   Wt 204 lb (92.5 kg)   SpO2 96%   BMI 33.95 kg/m  VS noted,  Constitutional: Pt appears in NAD HENT: Head: NCAT.  Right Ear: External ear normal.  Left Ear: External ear normal.  Eyes: . Pupils are equal, round, and reactive to light. Conjunctivae and EOM are normal Nose:  without d/c or deformity Neck: Neck supple. Gross normal ROM Cardiovascular: Normal rate and regular rhythm.   Pulmonary/Chest: Effort normal and breath sounds without rales or wheezing.  Abd:  Soft, NT, ND, + BS, no organomegaly Neurological: Pt is alert. At baseline orientation, motor grossly intact Skin: Skin is warm. No rashes, other new lesions, no LE edema Psychiatric: Pt behavior is normal without agitation  All otherwise neg per pt Lab Results  Component Value Date   WBC 6.1 07/16/2020   HGB 12.0 07/16/2020   HCT 37.2 07/16/2020   PLT 244 07/16/2020   GLUCOSE 85 07/12/2019   CHOL 171 07/16/2020   TRIG 80 07/16/2020   HDL 82  07/16/2020   LDLCALC 73 07/16/2020   ALT 16 07/16/2020   AST 21 07/16/2020   NA 138 07/12/2019   K 3.7 07/12/2019   CL 105 07/12/2019   CREATININE 0.80 07/12/2019   BUN 10 07/12/2019   CO2 28 07/12/2019   TSH 1.00 07/16/2020   HGBA1C 5.6 07/16/2020      Assessment & Plan:

## 2020-07-16 NOTE — Patient Instructions (Signed)

## 2020-07-17 LAB — CBC WITH DIFFERENTIAL/PLATELET
Absolute Monocytes: 586 cells/uL (ref 200–950)
Basophils Absolute: 31 cells/uL (ref 0–200)
Basophils Relative: 0.5 %
Eosinophils Absolute: 220 cells/uL (ref 15–500)
Eosinophils Relative: 3.6 %
HCT: 37.2 % (ref 35.0–45.0)
Hemoglobin: 12 g/dL (ref 11.7–15.5)
Lymphs Abs: 1299 cells/uL (ref 850–3900)
MCH: 29.3 pg (ref 27.0–33.0)
MCHC: 32.3 g/dL (ref 32.0–36.0)
MCV: 91 fL (ref 80.0–100.0)
MPV: 11.7 fL (ref 7.5–12.5)
Monocytes Relative: 9.6 %
Neutro Abs: 3965 cells/uL (ref 1500–7800)
Neutrophils Relative %: 65 %
Platelets: 244 10*3/uL (ref 140–400)
RBC: 4.09 10*6/uL (ref 3.80–5.10)
RDW: 13.9 % (ref 11.0–15.0)
Total Lymphocyte: 21.3 %
WBC: 6.1 10*3/uL (ref 3.8–10.8)

## 2020-07-17 LAB — LIPID PANEL
Cholesterol: 171 mg/dL (ref <200)
HDL: 82 mg/dL (ref >=50)
LDL Cholesterol (Calc): 73 mg/dL (calc)
Non-HDL Cholesterol (Calc): 89 mg/dL (calc) (ref <130)
Total CHOL/HDL Ratio: 2.1 (calc) (ref <5.0)
Triglycerides: 80 mg/dL (ref <150)

## 2020-07-17 LAB — HEMOGLOBIN A1C
Hgb A1c MFr Bld: 5.6 % of total Hgb (ref <5.7)
Mean Plasma Glucose: 114 (calc)
eAG (mmol/L): 6.3 (calc)

## 2020-07-17 LAB — HEPATITIS C ANTIBODY
Hepatitis C Ab: NONREACTIVE
SIGNAL TO CUT-OFF: 0.04 (ref <1.00)

## 2020-07-17 LAB — URINALYSIS, ROUTINE W REFLEX MICROSCOPIC
Bilirubin Urine: NEGATIVE
Glucose, UA: NEGATIVE
Hgb urine dipstick: NEGATIVE
Ketones, ur: NEGATIVE
Leukocytes,Ua: NEGATIVE
Nitrite: NEGATIVE
Protein, ur: NEGATIVE
Specific Gravity, Urine: 1.029 (ref 1.001–1.03)
pH: 5.5 (ref 5.0–8.0)

## 2020-07-17 LAB — HEPATIC FUNCTION PANEL
AG Ratio: 1.5 (calc) (ref 1.0–2.5)
ALT: 16 U/L (ref 6–29)
AST: 21 U/L (ref 10–35)
Albumin: 4.1 g/dL (ref 3.6–5.1)
Alkaline phosphatase (APISO): 44 U/L (ref 31–125)
Bilirubin, Direct: 0.1 mg/dL (ref 0.0–0.2)
Globulin: 2.8 g/dL (calc) (ref 1.9–3.7)
Indirect Bilirubin: 0.2 mg/dL (calc) (ref 0.2–1.2)
Total Bilirubin: 0.3 mg/dL (ref 0.2–1.2)
Total Protein: 6.9 g/dL (ref 6.1–8.1)

## 2020-07-17 LAB — VITAMIN B12: Vitamin B-12: 725 pg/mL (ref 200–1100)

## 2020-07-17 LAB — FERRITIN: Ferritin: 6 ng/mL — ABNORMAL LOW (ref 16–232)

## 2020-07-17 LAB — TRANSFERRIN: Transferrin: 280 mg/dL (ref 188–341)

## 2020-07-17 LAB — VITAMIN D 25 HYDROXY (VIT D DEFICIENCY, FRACTURES): Vit D, 25-Hydroxy: 25 ng/mL — ABNORMAL LOW (ref 30–100)

## 2020-07-17 LAB — TSH: TSH: 1 mIU/L

## 2020-07-18 ENCOUNTER — Encounter: Payer: Self-pay | Admitting: Internal Medicine

## 2020-07-18 ENCOUNTER — Other Ambulatory Visit: Payer: Self-pay | Admitting: Internal Medicine

## 2020-07-18 MED ORDER — VITAMIN D (ERGOCALCIFEROL) 1.25 MG (50000 UNIT) PO CAPS: 50000.0000 [IU] | ORAL_CAPSULE | ORAL | 0 refills | Status: DC

## 2020-07-20 ENCOUNTER — Encounter: Payer: Self-pay | Admitting: Internal Medicine

## 2020-07-20 NOTE — Assessment & Plan Note (Signed)
Or f/u lab 

## 2020-07-20 NOTE — Assessment & Plan Note (Signed)
stable overall by history and exam, recent data reviewed with pt, and pt to continue medical treatment as before,  to f/u any worsening symptoms or concerns  

## 2020-07-20 NOTE — Assessment & Plan Note (Signed)

## 2020-07-28 ENCOUNTER — Other Ambulatory Visit: Payer: Self-pay | Admitting: Internal Medicine

## 2020-08-27 ENCOUNTER — Ambulatory Visit: Payer: BC Managed Care – PPO | Admitting: Family Medicine

## 2020-08-27 ENCOUNTER — Ambulatory Visit: Payer: Self-pay

## 2020-08-27 ENCOUNTER — Other Ambulatory Visit: Payer: Self-pay

## 2020-08-27 ENCOUNTER — Encounter: Payer: Self-pay | Admitting: Family Medicine

## 2020-08-27 VITALS — BP 102/70 | HR 74 | Ht 65.0 in | Wt 206.0 lb

## 2020-08-27 DIAGNOSIS — M25562 Pain in left knee: Secondary | ICD-10-CM

## 2020-08-27 DIAGNOSIS — G8929 Other chronic pain: Secondary | ICD-10-CM

## 2020-08-27 DIAGNOSIS — M1712 Unilateral primary osteoarthritis, left knee: Secondary | ICD-10-CM | POA: Diagnosis not present

## 2020-08-27 NOTE — Progress Notes (Signed)
[name] [address] [place] [address] Phone: [phone] Subjective:   I Katherine Tucker am serving as a Neurosurgeon for Dr. Antoine Primas.  This visit occurred during the SARS-CoV-2 public health emergency.  Safety protocols were in place, including screening questions prior to the visit, additional usage of staff PPE, and extensive cleaning of exam room while observing appropriate contact time as indicated for disinfecting solutions.   I'm seeing this patient by the request  of:  Katherine Levins, MD  CC: Left knee pain  [id number]  Katherine Tucker is a 49 y.o. female coming in with complaint of left knee pain. Patient states her knee has just started to hurt for about a year. Pain every day with working out. Pain with squatting, step ups, stairs, and lunges. Patellofemoral. States she has tried topical medications that have not worked. 5/10 at its worse. Patient states she has gained weight about 20 lbs.  Patient states that the pain is not stopping her from activity but she would like to be more active.      Past Medical History:  Diagnosis Date  . Anxiety   . Chicken pox   . Depression   . Elevated prolactin level    elevated in 2009. MRI brain normal  . GERD (gastroesophageal reflux disease)   . Headache(784.0)   . Hiatal hernia   . Hyperlipidemia   . Hypertension   . IDA (iron deficiency anemia)   . Mammary duct ectasia of right breast 2009   excision of duct - Dr. Davina Poke 2010 - nonmalignant path  . Morbid obesity (HCC)   . Sleep apnea with use of continuous positive airway pressure (CPAP)    Past Surgical History:  Procedure Laterality Date  . BLADDER SURGERY     childhood  . breast lesion excison Right Jan 2010   Dr. Luisa Hart. Ductal ectasia  . lapband procedure  5-11   [facility]  . MOUTH SURGERY     Social History   Socioeconomic History  . Marital status: Single    Spouse name: Not on file  . Number of children: 0    . Years of education: 45  . Highest education level: Not on file  Occupational History  . Occupation: Scientist, research (medical): [employer]    Comment: Mudlogger  . 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 Smoking status: Never Smoker  . Smokeless tobacco: Never Used  Substance and Sexual Activity  . Alcohol use: Yes    Comment: 1 every 60 days  . Drug use: No  . Sexual activity: Never  Other Topics Concern  . Not on file  Social History Narrative   HSG, Completed Field seismologist, [employer]. 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 Remains active and in touch with her sorority sisters. Her mother lives in [place] and they are close.          Social Determinants of Health   Financial Resource Strain:   . Difficulty of Paying Living Expenses: Not on file  Food Insecurity:   . Worried About Programme researcher, broadcasting/film/video in the Last Year: Not on file  . Ran Out of Food in the Last Year: Not on file  Transportation Needs:   . Lack of Transportation (Medical): Not on file  . Lack of Transportation (Non-Medical): Not on  file  Physical Activity:   . Days of Exercise per Week: Not on file  . Minutes of Exercise per Session: Not on file  Stress:   . Feeling of Stress : Not on file  Social Connections:   . Frequency of Communication with Friends and Family: Not on file  . Frequency of Social Gatherings with Friends and Family: Not on file  . Attends Religious Services: Not on file  . Active Member of Clubs or Organizations: Not on file  . Attends Banker Meetings: Not on file  . Marital Status: Not on file   Allergies  Allergen Reactions  . Contrast Media [Iodinated Diagnostic Agents] Nausea And Vomiting    Severe nausea and vomiting   Family History  Problem Relation Age of Onset  . Arthritis Mother   . Hypertension Mother   .  Hyperlipidemia Mother   . Diabetes Mother   . Hypertension Father   . Hyperlipidemia Father   . Prostate cancer Father   . Diabetes Father   . Breast cancer Paternal Aunt   . Diabetes Paternal Grandmother   . Colon cancer Neg Hx   . Esophageal cancer Neg Hx   . Rectal cancer Neg Hx   . Stomach cancer Neg Hx      Current Outpatient Medications (Cardiovascular):  .  metoprolol tartrate (LOPRESSOR) 25 MG tablet, TAKE 1 TABLET (25 MG TOTAL) BY MOUTH 2 (TWO) TIMES DAILY. .  rosuvastatin (CRESTOR) 10 MG tablet, Take 1 tablet (10 mg total) by mouth daily.   Current Outpatient Medications (Analgesics):  Marland Kitchen  [facility] ASPIRIN LOW DOSE 81 MG EC tablet, TAKE 1 TABLET (81 MG TOTAL) BY MOUTH DAILY. SWALLOW WHOLE.  Current Outpatient Medications (Hematological):  Marland Kitchen  FERREX 150 150 MG capsule, TAKE 1 CAPSULE BY MOUTH EVERY DAY  Current Outpatient Medications (Other):  Marland Kitchen  Calcium 1500 MG tablet, Take 1,500 mg by mouth daily.   .  citalopram (CELEXA) 40 MG tablet, Take 1 tablet (40 mg total) by mouth daily. .  fluconazole (DIFLUCAN) 150 MG tablet, 1 tab by mouth every 3 days as needed .  multivitamin (THERAGRAN) tablet, Take 1 tablet by mouth daily. Centrum [place]. .  pantoprazole (PROTONIX) 40 MG tablet, Take 1 tablet (40 mg total) by mouth daily. .  Vitamin D, Ergocalciferol, (DRISDOL) 1.25 MG (50000 UNIT) CAPS capsule, Take 1 capsule (50,000 Units total) by mouth every 7 (seven) days.   Reviewed prior external information including notes and imaging from  primary care provider As well as notes that were available from care everywhere and other healthcare systems.  Past medical history, social, surgical and family history all reviewed in electronic medical record.  No pertanent information unless stated regarding to the chief complaint.   Review of Systems:  No headache, visual changes, nausea, vomiting, diarrhea, constipation, dizziness, abdominal pain, skin rash, fevers, chills, night sweats,  weight loss, swollen lymph nodes, body aches, joint swelling, chest pain, shortness of breath, mood changes. POSITIVE muscle aches  Objective  Blood pressure 102/70, pulse 74, height 5\' 5"  (1.651 m), weight 206 lb (93.4 kg), SpO2 98 %.   General: No apparent distress alert and oriented x3 mood and affect normal, dressed appropriately.  HEENT: Pupils equal, extraocular movements intact  Respiratory: Patient's speak in full sentences and does not appear short of breath  Cardiovascular: No lower extremity edema, non tender, no erythema  Gait mild antalgic  MSK: Left knee has no gross abnormality.  Patient does have lateral tracking  of the patella with severe tenderness over the lateral aspect of the patella.  Positive patellar grind test noted.  Lacks last 2 degrees of flexion of the knee.  Contralateral knee seems to be unremarkable  Limited musculoskeletal ultrasound was performed and interpreted by Judi Saa  Limited ultrasound of patient's left knee shows that patient has significant narrowing of the patellofemoral joint with a trace effusion noted.  Mild calcific changes in the area as well.  Patient's medial joint space mild narrowing as well the meniscus appear to be unremarkable. Impression: Patellofemoral arthritis    Impression and Recommendations:     The above documentation has been reviewed and is accurate and complete Judi Saa, DO       Note: This dictation was prepared with Dragon dictation along with smaller phrase technology. Any transcriptional errors that result from this process are unintentional.

## 2020-08-27 NOTE — Patient Instructions (Addendum)
Left knee Tru pull lite Exercises 3x a week Ice 20 min Elliptical and biking for cardio No lunges, no deep squats or step ups See me again in 7-8 weeks

## 2020-08-27 NOTE — Assessment & Plan Note (Signed)
Moderate on ultrasound today.  Does have lateral tracking, Tru pull lite, topical anti-inflammatories, icing regimen, over-the-counter medications discussed, importance of weight loss and low impact exercises, follow-up 6 weeks will consider injection, physical therapy and likely x-rays

## 2020-09-05 LAB — HM MAMMOGRAPHY

## 2020-10-02 ENCOUNTER — Other Ambulatory Visit: Payer: Self-pay | Admitting: Obstetrics and Gynecology

## 2020-10-02 DIAGNOSIS — R928 Other abnormal and inconclusive findings on diagnostic imaging of breast: Secondary | ICD-10-CM

## 2020-10-10 ENCOUNTER — Other Ambulatory Visit: Payer: Self-pay | Admitting: Obstetrics and Gynecology

## 2020-10-15 ENCOUNTER — Telehealth: Payer: Self-pay | Admitting: Physician Assistant

## 2020-10-15 NOTE — Telephone Encounter (Signed)
Received a new pt referral from Dr. Su Hilt at [facility] for chronic anemia. Ms. Katherine Tucker has returned my call and has been scheduled to see [name] on 11/30 at 130pm w/labs at 1pm.

## 2020-10-18 ENCOUNTER — Other Ambulatory Visit: Payer: Self-pay | Admitting: Internal Medicine

## 2020-10-22 ENCOUNTER — Encounter: Payer: Self-pay | Admitting: Family Medicine

## 2020-10-22 ENCOUNTER — Other Ambulatory Visit: Payer: Self-pay

## 2020-10-22 ENCOUNTER — Ambulatory Visit (INDEPENDENT_AMBULATORY_CARE_PROVIDER_SITE_OTHER): Payer: BC Managed Care – PPO

## 2020-10-22 ENCOUNTER — Ambulatory Visit: Payer: BC Managed Care – PPO | Admitting: Family Medicine

## 2020-10-22 VITALS — BP 110/80 | HR 64 | Ht 65.0 in | Wt 206.0 lb

## 2020-10-22 DIAGNOSIS — G8929 Other chronic pain: Secondary | ICD-10-CM

## 2020-10-22 DIAGNOSIS — M1712 Unilateral primary osteoarthritis, left knee: Secondary | ICD-10-CM | POA: Diagnosis not present

## 2020-10-22 DIAGNOSIS — M25562 Pain in left knee: Secondary | ICD-10-CM

## 2020-10-22 NOTE — Progress Notes (Signed)
[facility] [address] [place] [address] Phone: [phone] Subjective:   I Katherine Tucker am serving as a Neurosurgeon for Dr. Antoine Primas.  This visit occurred during the SARS-CoV-2 public health emergency.  Safety protocols were in place, including screening questions prior to the visit, additional usage of staff PPE, and extensive cleaning of exam room while observing appropriate contact time as indicated for disinfecting solutions.   I'm seeing this patient by the request  of:  [name], MD  CC: left knee pain follow up   [id number]   08/27/2020 Moderate on ultrasound today.  Does have lateral tracking, Tru pull lite, topical anti-inflammatories, icing regimen, over-the-counter medications discussed, importance of weight loss and low impact exercises, follow-up 6 weeks will consider injection, physical therapy and likely x-rays  Update 10/22/2020 Katherine Tucker is a 49 y.o. female coming in with complaint of left knee pain. Patient states she has been wearing the brace and it helps. Hasn't gotten any worse.  Patient has not been doing the exercises regularly.  Patient states that as long as she does wear the brace it does seem to be doing relatively well but she has not been doing the exercises regularly and has cut back on a lot of her activities so she does not know if she is improving or not.       Past Medical History:  Diagnosis Date  . Anxiety   . Chicken pox   . Depression   . Elevated prolactin level    elevated in 2009. MRI brain normal  . GERD (gastroesophageal reflux disease)   . Headache(784.0)   . Hiatal hernia   . Hyperlipidemia   . Hypertension   . IDA (iron deficiency anemia)   . Mammary duct ectasia of right breast 2009   excision of duct - Dr. Davina Poke 2010 - nonmalignant path  . Morbid obesity (HCC)   . Sleep apnea with use of continuous positive airway pressure (CPAP)    Past Surgical History:  Procedure  Laterality Date  . BLADDER SURGERY     childhood  . breast lesion excison Right Jan 2010   Dr. Luisa Hart. Ductal ectasia  . lapband procedure  5-11   [facility]  . MOUTH SURGERY     Social History   Socioeconomic History  . Marital status: Single    Spouse name: Not on file  . Number of children: 0  . Years of education: 39  . Highest education level: Not on file  Occupational History  . Occupation: Scientist, research (medical): [employer]    Comment: Mudlogger  . 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 Smoking status: Never Smoker  . Smokeless tobacco: Never Used  Substance and Sexual Activity  . Alcohol use: Yes    Comment: 1 every 60 days  . Drug use: No  . Sexual activity: Never  Other Topics Concern  . Not on file  Social History Narrative   HSG, Completed Field seismologist, [employer]. 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 Remains active and in touch with her sorority sisters. Her mother lives in [place] and they are close.          Social Determinants of Health   Financial Resource Strain:   . Difficulty of Paying Living Expenses: Not on file  Food Insecurity:   .  Worried About Programme researcher, broadcasting/film/video in the Last Year: Not on file  . Ran Out of Food in the Last Year: Not on file  Transportation Needs:   . Lack of Transportation (Medical): Not on file  . Lack of Transportation (Non-Medical): Not on file  Physical Activity:   . Days of Exercise per Week: Not on file  . Minutes of Exercise per Session: Not on file  Stress:   . Feeling of Stress : Not on file  Social Connections:   . Frequency of Communication with Friends and Family: Not on file  . Frequency of Social Gatherings with Friends and Family: Not on file  . Attends Religious Services: Not on file  . Active Member of Clubs or Organizations: Not on file  . Attends Tax inspector Meetings: Not on file  . Marital Status: Not on file   Allergies  Allergen Reactions  . Contrast Media [Iodinated Diagnostic Agents] Nausea And Vomiting    Severe nausea and vomiting   Family History  Problem Relation Age of Onset  . Arthritis Mother   . Hypertension Mother   . Hyperlipidemia Mother   . Diabetes Mother   . Hypertension Father   . Hyperlipidemia Father   . Prostate cancer Father   . Diabetes Father   . Breast cancer Paternal Aunt   . Diabetes Paternal Grandmother   . Colon cancer Neg Hx   . Esophageal cancer Neg Hx   . Rectal cancer Neg Hx   . Stomach cancer Neg Hx      Current Outpatient Medications (Cardiovascular):  .  metoprolol tartrate (LOPRESSOR) 25 MG tablet, TAKE 1 TABLET (25 MG TOTAL) BY MOUTH 2 (TWO) TIMES DAILY. .  rosuvastatin (CRESTOR) 10 MG tablet, Take 1 tablet (10 mg total) by mouth daily.   Current Outpatient Medications (Analgesics):  Marland Kitchen  [facility]S ASPIRIN LOW DOSE 81 MG EC tablet, TAKE 1 TABLET (81 MG TOTAL) BY MOUTH DAILY. SWALLOW WHOLE.  Current Outpatient Medications (Hematological):  Marland Kitchen  FERREX 150 150 MG capsule, TAKE 1 CAPSULE BY MOUTH EVERY DAY  Current Outpatient Medications (Other):  Marland Kitchen  Calcium 1500 MG tablet, Take 1,500 mg by mouth daily.   .  citalopram (CELEXA) 40 MG tablet, Take 1 tablet (40 mg total) by mouth daily. .  fluconazole (DIFLUCAN) 150 MG tablet, 1 tab by mouth every 3 days as needed .  multivitamin (THERAGRAN) tablet, Take 1 tablet by mouth daily. Centrum [place]. .  pantoprazole (PROTONIX) 40 MG tablet, Take 1 tablet (40 mg total) by mouth daily. .  Vitamin D, Ergocalciferol, (DRISDOL) 1.25 MG (50000 UNIT) CAPS capsule, Take 1 capsule (50,000 Units total) by mouth every 7 (seven) days.   Reviewed prior external information including notes and imaging from  primary care provider As well as notes that were available from care everywhere and other healthcare systems.  Past medical history, social, surgical  and family history all reviewed in electronic medical record.  No pertanent information unless stated regarding to the chief complaint.   Review of Systems:  No headache, visual changes, nausea, vomiting, diarrhea, constipation, dizziness, abdominal pain, skin rash, fevers, chills, night sweats, weight loss, swollen lymph nodes, body aches, joint swelling, chest pain, shortness of breath, mood changes. POSITIVE muscle aches  Objective  Blood pressure 110/80, pulse 64, height 5\' 5"  (1.651 m), weight 206 lb (93.4 kg), last menstrual period 10/18/2020, SpO2 96 %.   General: No apparent distress alert and oriented x3 mood and affect normal,  dressed appropriately.  HEENT: Pupils equal, extraocular movements intact  Respiratory: Patient's speak in full sentences and does not appear short of breath  Cardiovascular: No lower extremity edema, non tender, no erythema  Left knee exam still shows that patient has severe lateral tracking of the patella noted.  Positive patellar grind test noted.  Lacks last 5 degrees of flexion in the last 2 degrees of extension.  No significant instability with the rest of the knee noted today.  Y.     Impression and Recommendations:     The above documentation has been reviewed and is accurate and complete Judi Saa, DO

## 2020-10-22 NOTE — Assessment & Plan Note (Signed)
Moderate to severe overall.  X-rays are pending today.  Encourage patient to consider physical therapy which she declined.  We will start with the home exercises and reprinted the exercises today for her.  Encouraged her to use the brace on a more regular basis with any type of working on her long walks.  Follow-up with me again 6 to 8 weeks continuing to have trouble we will need to consider formal physical therapy, injections, or advanced imaging for any instability.

## 2020-10-22 NOTE — Patient Instructions (Addendum)
Good to see you Try to wear the brace as described Exercise 3 times a week Xray today See me again in 8 weeks

## 2020-10-30 ENCOUNTER — Other Ambulatory Visit: Payer: Self-pay | Admitting: Obstetrics and Gynecology

## 2020-10-30 ENCOUNTER — Ambulatory Visit: Payer: BC Managed Care – PPO

## 2020-10-30 ENCOUNTER — Other Ambulatory Visit: Payer: Self-pay

## 2020-10-30 ENCOUNTER — Ambulatory Visit
Admission: RE | Admit: 2020-10-30 | Discharge: 2020-10-30 | Disposition: A | Discharge status: Home / Self Care | Payer: BC Managed Care – PPO | Source: Ambulatory Visit | Attending: Obstetrics and Gynecology | Admitting: Obstetrics and Gynecology

## 2020-10-30 DIAGNOSIS — R928 Other abnormal and inconclusive findings on diagnostic imaging of breast: Secondary | ICD-10-CM

## 2020-11-02 NOTE — Progress Notes (Signed)
[facility] [phone]   [phone]  CONSULT NOTE  REFERRING PHYSICIAN: Dr. Su Hilt  REASON FOR CONSULTATION:  Anemia  HPI Katherine Tucker is a 49 y.o. female with a past medical history significant for hypertension, obstructive sleep apnea, GERD, herpes zoster, hyperlipidemia, depression, and anxiety is referred to the clinic for evaluation of anemia.  The patient has a history of anemia since 2011 after she had a lap band procedure under the care of Dr. Franne Grip at [facility]. Her WBCs are typically low as well. Per chart review it appears that the patient has had evidence for anemia and leukocytopenia since 2012, which is the oldest records visible to me.   The patient had a follow-up appointment with her OBGYN on 09/17/20. The patient had routine blood work performed that day which showed anemia with a hemoglobin of 7.8 and it was microcytic with a MCV of 69. Her WBC were low at 2.4. Her platelets were within normal limits at 208k. She had hemoglobin electrophoresis which was negative for sickle cell anemia or thalassemia. Interestingly, the patient had a CBC performed in August 2021 which noted a normal hemoglobin at 12.0. Per chart review, her ferrin was very low at 6 in August 2021. The patient was referred to the clinic today for further evaluation. The patient has been taking iron supplements consistently for about 1 year with Ferrex 150 mg daily.   Overall, the patient is reporting significant fatigue. She does not craving ice or starch. She denies any significant shortness of breath, chest pain, or palpitations. She does not take iron supplements due to constipation.The patient denies any abnormal bleeding or bruising including epistaxis, gingival bleeding, hemoptysis, hematemesis, melena, hematochezia, or hematuria. She denies heavy menstrual periods. She has menstrual periods every 4 weeks lasting 5 days. Her menstrual  periods are heavy on day too for which she uses 5-6 super tampons. She denies easy bruising. She denies any particular dietary habits such as being a vegan or vegetarian;however, she only eats red meat about 1 per week due  patient preference. Of note, the patient has had a colonoscopy last year (09/06/2019)  as well as endoscopy because of her anemia. Her gastroenterologist is Dr. Marina Goodell. Her endoscopy showed that her prior lap band surgery appeared too tight and this was corrected. Otherwise her exam was unremarkable for any evidence of blood loss or ulcerations. Her colonoscopy was unremarkable. She denies blood thinner use. She rarely takes NSAIDs except she has been taking some recently due to a recent dental surgery on 10/24/20.She denies any nausea or vomiting. She denies abdominal pain, fevers, chills, night sweats, or unexplained weight loss.   The patient's family history consists of a mother who has obesity, thyroid dysfunction, and diabetes mellitus.  The patient's maternal grandma reportedly had anemia.  The patient's father has diabetes mellitus, history of prostate cancer, as well as anemia although the etiology is unclear.  The patient works as an Pensions consultant.  She is single does not have any children.  She denies any tobacco or drug use.  The patient drinks approximately 1 alcoholic beverage every 3 to 4 months.  HPI  Past Medical History:  Diagnosis Date  . Anxiety   . Chicken pox   . Depression   . Elevated prolactin level    elevated in 2009. MRI brain normal  . GERD (gastroesophageal reflux disease)   . Headache(784.0)   . Hiatal hernia   . Hyperlipidemia   .  Hypertension   . IDA (iron deficiency anemia)   . Mammary duct ectasia of right breast 2009   excision of duct - Dr. Davina Poke 2010 - nonmalignant path  . Morbid obesity (HCC)   . Sleep apnea with use of continuous positive airway pressure (CPAP)     Past Surgical History:  Procedure Laterality Date  . BLADDER SURGERY      childhood  . breast lesion excison Right Jan 2010   Dr. Luisa Hart. Ductal ectasia  . lapband procedure  5-11   [facility]  . MOUTH SURGERY      Family History  Problem Relation Age of Onset  . Arthritis Mother   . Hypertension Mother   . Hyperlipidemia Mother   . Diabetes Mother   . Hypertension Father   . Hyperlipidemia Father   . Prostate cancer Father   . Diabetes Father   . Breast cancer Paternal Aunt   . Diabetes Paternal Grandmother   . Colon cancer Neg Hx   . Esophageal cancer Neg Hx   . Rectal cancer Neg Hx   . Stomach cancer Neg Hx     Social History Social History   Tobacco Use  . Smoking status: Never Smoker  . Smokeless tobacco: Never Used  Substance Use Topics  . Alcohol use: Yes    Comment: 1 every 60 days  . Drug use: No    Allergies  Allergen Reactions  . Contrast Media [Iodinated Diagnostic Agents] Nausea And Vomiting    Severe nausea and vomiting    Current Outpatient Medications  Medication Sig Dispense Refill  . Calcium 1500 MG tablet Take 1,500 mg by mouth daily.      . citalopram (CELEXA) 40 MG tablet Take 1 tablet (40 mg total) by mouth daily. 90 tablet 3  . CVS ASPIRIN LOW DOSE 81 MG EC tablet TAKE 1 TABLET (81 MG TOTAL) BY MOUTH DAILY. SWALLOW WHOLE. 90 tablet 1  . FERREX 150 150 MG capsule TAKE 1 CAPSULE BY MOUTH EVERY DAY 90 capsule 3  . fluconazole (DIFLUCAN) 150 MG tablet 1 tab by mouth every 3 days as needed 2 tablet 1  . metoprolol tartrate (LOPRESSOR) 25 MG tablet TAKE 1 TABLET (25 MG TOTAL) BY MOUTH 2 (TWO) TIMES DAILY. 180 tablet 3  . multivitamin (THERAGRAN) tablet Take 1 tablet by mouth daily. Centrum [place].    . pantoprazole (PROTONIX) 40 MG tablet Take 1 tablet (40 mg total) by mouth daily. 90 tablet 3  . rosuvastatin (CRESTOR) 10 MG tablet Take 1 tablet (10 mg total) by mouth daily. 90 tablet 3  . Vitamin D, Ergocalciferol, (DRISDOL) 1.25 MG (50000 UNIT) CAPS capsule Take 1 capsule (50,000 Units total) by mouth every 7  (seven) days. 12 capsule 0   No current facility-administered medications for this visit.    REVIEW OF SYSTEMS:   Review of Systems  Constitutional: Positive for fatigue. Negative for appetite change, chills, fatigue, fever and unexpected weight change.  HENT: Negative for mouth sores, nosebleeds, sore throat and trouble swallowing.   Eyes: Negative for eye problems and icterus.  Respiratory: Negative for cough, hemoptysis, shortness of breath and wheezing.   Cardiovascular: Negative for chest pain and leg swelling.  Gastrointestinal: Negative for abdominal pain, constipation, diarrhea, nausea and vomiting.  Genitourinary: Negative for bladder incontinence, difficulty urinating, dysuria, frequency and hematuria.   Musculoskeletal: Negative for back pain, gait problem, neck pain and neck stiffness.  Skin: Negative for itching and rash.  Neurological: Negative for dizziness, extremity weakness, gait  problem, headaches, light-headedness and seizures.  Hematological: Negative for adenopathy. Does not bruise/bleed easily.  Psychiatric/Behavioral: Negative for confusion, depression and sleep disturbance. The patient is not nervous/anxious.     PHYSICAL EXAMINATION:  Blood pressure 108/76, pulse 66, temperature 97.6 F (36.4 C), temperature source Tympanic, resp. rate 17, height  (1.651 m), weight 207 lb 8 oz (94.1 kg), last menstrual period 10/18/2020, SpO2 98 %.  ECOG PERFORMANCE STATUS: 1  Physical Exam  Constitutional: Oriented to person, place, and time and well-developed, well-nourished, and in no distress.  HENT:  Head: Normocephalic and atraumatic.  Mouth/Throat: Oropharynx is clear and moist. No oropharyngeal exudate.  Eyes: Conjunctivae are normal. Right eye exhibits no discharge. Left eye exhibits no discharge. No scleral icterus.  Neck: Normal range of motion. Neck supple.  Cardiovascular: Normal rate, regular rhythm, normal heart sounds and intact distal pulses.     Pulmonary/Chest: Effort normal and breath sounds normal. No respiratory distress. No wheezes. No rales.  Abdominal: Soft. Bowel sounds are normal. Exhibits no distension and no mass. There is no tenderness.  Musculoskeletal: Normal range of motion. Exhibits no edema.  Lymphadenopathy:    No cervical adenopathy.  Neurological: Alert and oriented to person, place, and time. Exhibits normal muscle tone. Gait normal. Coordination normal.  Skin: Skin is warm and dry. No rash noted. Not diaphoretic. No erythema. No pallor.  Psychiatric: Mood, memory and judgment normal.  Vitals reviewed.  LABORATORY DATA: Lab Results  Component Value Date   WBC 6.1 07/16/2020   HGB 12.0 07/16/2020   HCT 37.2 07/16/2020   MCV 91.0 07/16/2020   PLT 244 07/16/2020      Chemistry      Component Value Date/Time   NA 138 07/12/2019 0954   K 3.7 07/12/2019 0954   CL 105 07/12/2019 0954   CO2 28 07/12/2019 0954   BUN 10 07/12/2019 0954   CREATININE 0.80 07/12/2019 0954      Component Value Date/Time   CALCIUM 9.6 07/12/2019 0954   ALKPHOS 37 (L) 07/12/2019 0954   AST 21 07/16/2020 1525   ALT 16 07/16/2020 1525   BILITOT 0.3 07/16/2020 1525       RADIOGRAPHIC STUDIES: DG Knee Complete 4 Views Left  Result Date: 10/23/2020 CLINICAL DATA:  Chronic left knee pain, no reported injury. EXAM: LEFT KNEE - COMPLETE 4+ VIEW COMPARISON:  None. FINDINGS: No joint effusion or fracture. Tricompartment osteophytosis. Medial and lateral joint spaces appear fairly well maintained. IMPRESSION: Tricompartment osteophytosis. Electronically Signed   By: Leanna Battles M.D.   On: 10/23/2020 14:55   MM DIAG BREAST TOMO UNI RIGHT  Result Date: 10/30/2020 CLINICAL DATA:  49 year old female presenting as a recall from screening for possible right breast asymmetry. EXAM: DIGITAL DIAGNOSTIC UNILATERAL RIGHT MAMMOGRAM WITH TOMO AND CAD COMPARISON:  Previous exam(s). ACR Breast Density Category c: The breast tissue is  heterogeneously dense, which may obscure small masses. FINDINGS: Spot compression tomosynthesis MLO and full field tomosynthesis mL views of the right breast were performed for the questioned asymmetry seen on screening mammogram only on the MLO view in the inferior right breast. On the additional imaging the tissue in this area disperses without persistent asymmetry, mass or distortion. The finding on screening mammogram most likely represented normal overlapping fibroglandular tissue. IMPRESSION: Resolution of the questioned asymmetry seen on screening mammogram in the inferior right breast. RECOMMENDATION: Screening mammogram in one year.(Code:[id number]) I have discussed the findings and recommendations with the patient. If applicable, a reminder letter will be  sent to the patient regarding the next appointment. BI-RADS CATEGORY  1: Negative. Electronically Signed   By: Emmaline Kluver M.D.   On: 10/30/2020 09:13    ASSESSMENT: This is a very pleasant 49 year old African American female referred to the clinic for microcytic anemia.    PLAN: The patient had several lab studies performed today including a CBC, CMP, LDH, SPEP with immunofixation, folic acid, B12, and iron studies performed. Her CBC was unremarkable. Her Hgb was within normal limits at 12.8. Her MCV was WNL at 90.5. her WBC and  Platelets were within normal limits.  The patient's CMP is unremarkable. Her iron studies are unremarkable; however, her ferritin continues to be low at 8 despite 1 year of oral iron supplements. The patient's other lab studies are pending at this time.  I called the patient to give her the option of IV iron infusions with venofer weekly x4 vs close monitoring and continuing with oral iron supplements. Given the low iron storage despite taking oral iron supplements, Dr. Arbutus Ped believes it is reasonable to offer IV iron infusions. I called the patient to discuss this with her. She was agreeable to proceed with IV  iron.   We will see her back for a follow up in 2 months for evaluation and for a repeat CBC, iron studies, and ferritin.   She will continue to take her oral iron supplement as well.   The patient voices understanding of current disease status and treatment options and is in agreement with the current care plan.  All questions were answered. The patient knows to call the clinic with any problems, questions or concerns. We can certainly see the patient much sooner if necessary.  Thank you so much for allowing me to participate in the care of Katherine Tucker. I will continue to follow up the patient with you and assist in her care. The total time spent in the appointment was 60 minutes.  Disclaimer: This note was dictated with voice recognition software. Similar sounding words can inadvertently be transcribed and may not be corrected upon review.   Shawnee Higham L Sha Amer November 02, 2020, 10:04 PM  ADDENDUM: Hematology/Oncology Attending: I had a face-to-face encounter with the patient today. I recommended her care plan. This is a very pleasant 49 years old African-American female with history of GERD, obstructive sleep apnea, hypertension, dyslipidemia as well as depression and anxiety. The patient also has a history of anemia since 2011 when she had a lap band procedure. Her anemia was microcytic and she also had occasional leukocytopenia. She was seen recently by her gynecologist and was found to have persistent microcytic anemia. A year ago her hemoglobin was down to 6.6 on 07/17/2019. This improved to 9.5 on 09/06/2019 but the recent CBC 3 months ago showed normal hemoglobin and hematocrit but the patient continues to have low serum ferritin as well as low iron study. She is currently on oral iron tablets with Ferrex 150 mg p.o. daily. She has been consistent with her medication. She was referred to [place] today for evaluation and recommendation regarding her anemia. The patient had several  studies performed today including repeat CBC that showed normal hemoglobin and hematocrit. Her serum iron is normal but the patient had low ferritin level of 8. I recommended for the patient to receive iron infusion with Venofer 4 weekly doses. We will see her back for follow-up visit in around 2 months for evaluation and repeat CBC, iron study and ferritin. The patient  was advised to call immediately if she has any other concerning symptoms in the interval.  Disclaimer: This note was dictated with voice recognition software. Similar sounding words can inadvertently be transcribed and may be missed upon review. Lajuana MatteMohamed K Mohamed, MD 11/05/20

## 2020-11-05 ENCOUNTER — Inpatient Hospital Stay: Payer: BC Managed Care – PPO | Attending: Physician Assistant | Admitting: Physician Assistant

## 2020-11-05 ENCOUNTER — Other Ambulatory Visit: Payer: Self-pay | Admitting: Physician Assistant

## 2020-11-05 ENCOUNTER — Other Ambulatory Visit: Payer: Self-pay

## 2020-11-05 ENCOUNTER — Inpatient Hospital Stay: Payer: BC Managed Care – PPO

## 2020-11-05 VITALS — BP 108/76 | HR 66 | Temp 97.6°F | Resp 17 | Ht 65.0 in | Wt 207.5 lb

## 2020-11-05 DIAGNOSIS — Z8042 Family history of malignant neoplasm of prostate: Secondary | ICD-10-CM | POA: Diagnosis not present

## 2020-11-05 DIAGNOSIS — F32A Depression, unspecified: Secondary | ICD-10-CM | POA: Insufficient documentation

## 2020-11-05 DIAGNOSIS — Z9884 Bariatric surgery status: Secondary | ICD-10-CM | POA: Diagnosis not present

## 2020-11-05 DIAGNOSIS — K219 Gastro-esophageal reflux disease without esophagitis: Secondary | ICD-10-CM | POA: Insufficient documentation

## 2020-11-05 DIAGNOSIS — D509 Iron deficiency anemia, unspecified: Secondary | ICD-10-CM

## 2020-11-05 DIAGNOSIS — Z8261 Family history of arthritis: Secondary | ICD-10-CM

## 2020-11-05 DIAGNOSIS — D500 Iron deficiency anemia secondary to blood loss (chronic): Secondary | ICD-10-CM

## 2020-11-05 DIAGNOSIS — G4733 Obstructive sleep apnea (adult) (pediatric): Secondary | ICD-10-CM | POA: Insufficient documentation

## 2020-11-05 DIAGNOSIS — Z8249 Family history of ischemic heart disease and other diseases of the circulatory system: Secondary | ICD-10-CM | POA: Insufficient documentation

## 2020-11-05 DIAGNOSIS — Z8349 Family history of other endocrine, nutritional and metabolic diseases: Secondary | ICD-10-CM | POA: Diagnosis not present

## 2020-11-05 DIAGNOSIS — Z803 Family history of malignant neoplasm of breast: Secondary | ICD-10-CM | POA: Insufficient documentation

## 2020-11-05 DIAGNOSIS — K5900 Constipation, unspecified: Secondary | ICD-10-CM | POA: Insufficient documentation

## 2020-11-05 DIAGNOSIS — E785 Hyperlipidemia, unspecified: Secondary | ICD-10-CM | POA: Diagnosis not present

## 2020-11-05 DIAGNOSIS — N920 Excessive and frequent menstruation with regular cycle: Secondary | ICD-10-CM | POA: Insufficient documentation

## 2020-11-05 DIAGNOSIS — Z79899 Other long term (current) drug therapy: Secondary | ICD-10-CM | POA: Diagnosis not present

## 2020-11-05 DIAGNOSIS — G8929 Other chronic pain: Secondary | ICD-10-CM | POA: Diagnosis not present

## 2020-11-05 DIAGNOSIS — D72819 Decreased white blood cell count, unspecified: Secondary | ICD-10-CM | POA: Diagnosis not present

## 2020-11-05 DIAGNOSIS — I10 Essential (primary) hypertension: Secondary | ICD-10-CM | POA: Diagnosis not present

## 2020-11-05 DIAGNOSIS — Z833 Family history of diabetes mellitus: Secondary | ICD-10-CM

## 2020-11-05 LAB — CBC WITH DIFFERENTIAL (CANCER CENTER ONLY)
Abs Immature Granulocytes: 0.01 10*3/uL (ref 0.00–0.07)
Basophils Absolute: 0 10*3/uL (ref 0.0–0.1)
Basophils Relative: 0 %
Eosinophils Absolute: 0.2 10*3/uL (ref 0.0–0.5)
Eosinophils Relative: 2 %
HCT: 38 % (ref 36.0–46.0)
Hemoglobin: 12.8 g/dL (ref 12.0–15.0)
Immature Granulocytes: 0 %
Lymphocytes Relative: 15 %
Lymphs Abs: 1.1 10*3/uL (ref 0.7–4.0)
MCH: 30.5 pg (ref 26.0–34.0)
MCHC: 33.7 g/dL (ref 30.0–36.0)
MCV: 90.5 fL (ref 80.0–100.0)
Monocytes Absolute: 0.6 10*3/uL (ref 0.1–1.0)
Monocytes Relative: 8 %
Neutro Abs: 5.4 10*3/uL (ref 1.7–7.7)
Neutrophils Relative %: 75 %
Platelet Count: 198 10*3/uL (ref 150–400)
RBC: 4.2 MIL/uL (ref 3.87–5.11)
RDW: 14.1 % (ref 11.5–15.5)
WBC Count: 7.3 10*3/uL (ref 4.0–10.5)
nRBC: 0 % (ref 0.0–0.2)

## 2020-11-05 LAB — CMP (CANCER CENTER ONLY)
ALT: 15 U/L (ref 0–44)
AST: 22 U/L (ref 15–41)
Albumin: 3.8 g/dL (ref 3.5–5.0)
Alkaline Phosphatase: 43 U/L (ref 38–126)
Anion gap: 6 (ref 5–15)
BUN: 15 mg/dL (ref 6–20)
CO2: 26 mmol/L (ref 22–32)
Calcium: 10.7 mg/dL — ABNORMAL HIGH (ref 8.9–10.3)
Chloride: 104 mmol/L (ref 98–111)
Creatinine: 0.91 mg/dL (ref 0.44–1.00)
GFR, Estimated: >60 mL/min (ref >60)
Glucose, Bld: 99 mg/dL (ref 70–99)
Potassium: 3.7 mmol/L (ref 3.5–5.1)
Sodium: 136 mmol/L (ref 135–145)
Total Bilirubin: 0.5 mg/dL (ref 0.3–1.2)
Total Protein: 7.1 g/dL (ref 6.5–8.1)

## 2020-11-05 LAB — IRON AND TIBC
Iron: 135 ug/dL (ref 41–142)
Saturation Ratios: 40 % (ref 21–57)
TIBC: 333 ug/dL (ref 236–444)
UIBC: 198 ug/dL (ref 120–384)

## 2020-11-05 LAB — VITAMIN B12: Vitamin B-12: 580 pg/mL (ref 180–914)

## 2020-11-05 LAB — FERRITIN: Ferritin: 8 ng/mL — ABNORMAL LOW (ref 11–307)

## 2020-11-05 LAB — FOLATE: Folate: 18.4 ng/mL (ref >5.9)

## 2020-11-06 LAB — PROTEIN ELECTROPHORESIS, SERUM, WITH REFLEX
A/G Ratio: 1.3 (ref 0.7–1.7)
Albumin ELP: 3.7 g/dL (ref 2.9–4.4)
Alpha-1-Globulin: 0.2 g/dL (ref 0.0–0.4)
Alpha-2-Globulin: 0.6 g/dL (ref 0.4–1.0)
Beta Globulin: 0.9 g/dL (ref 0.7–1.3)
Gamma Globulin: 1.1 g/dL (ref 0.4–1.8)
Globulin, Total: 2.8 g/dL (ref 2.2–3.9)
Total Protein ELP: 6.5 g/dL (ref 6.0–8.5)

## 2020-11-07 ENCOUNTER — Telehealth: Payer: Self-pay | Admitting: Internal Medicine

## 2020-11-07 NOTE — Telephone Encounter (Signed)
Scheduled per los. Called and spoke with patient. Confirmed appts. Patient will call back to schedule 2 mth f/u.

## 2020-11-21 ENCOUNTER — Other Ambulatory Visit: Payer: Self-pay

## 2020-11-21 ENCOUNTER — Inpatient Hospital Stay: Payer: BC Managed Care – PPO | Attending: Physician Assistant

## 2020-11-21 VITALS — BP 111/76 | HR 57 | Temp 98.6°F | Resp 16

## 2020-11-21 DIAGNOSIS — D509 Iron deficiency anemia, unspecified: Secondary | ICD-10-CM | POA: Insufficient documentation

## 2020-11-21 DIAGNOSIS — D508 Other iron deficiency anemias: Secondary | ICD-10-CM

## 2020-11-21 MED ORDER — SODIUM CHLORIDE 0.9 % IV SOLN
200.0000 mg | Freq: Once | INTRAVENOUS | Status: AC
  Administered 2020-11-21: 11:00:00 200 mg via INTRAVENOUS
  Filled 2020-11-21: qty 200

## 2020-11-21 MED ORDER — ACETAMINOPHEN 325 MG PO TABS
ORAL_TABLET | ORAL | Status: AC
  Filled 2020-11-21: qty 2

## 2020-11-21 MED ORDER — SODIUM CHLORIDE 0.9 % IV SOLN
Freq: Once | INTRAVENOUS | Status: AC
  Filled 2020-11-21: qty 250

## 2020-11-21 MED ORDER — ACETAMINOPHEN 325 MG PO TABS
650.0000 mg | ORAL_TABLET | Freq: Once | ORAL | Status: AC
  Administered 2020-11-21: 650 mg via ORAL

## 2020-11-21 MED ORDER — DIPHENHYDRAMINE HCL 25 MG PO CAPS: 50.0000 mg | ORAL_CAPSULE | Freq: Once | ORAL | Status: DC

## 2020-11-21 NOTE — Patient Instructions (Signed)

## 2020-11-28 ENCOUNTER — Other Ambulatory Visit: Payer: Self-pay

## 2020-11-28 ENCOUNTER — Inpatient Hospital Stay: Payer: BC Managed Care – PPO

## 2020-11-28 VITALS — BP 121/78 | HR 57 | Temp 98.3°F | Resp 16 | Ht 66.0 in | Wt 216.0 lb

## 2020-11-28 DIAGNOSIS — D509 Iron deficiency anemia, unspecified: Secondary | ICD-10-CM | POA: Diagnosis not present

## 2020-11-28 DIAGNOSIS — D508 Other iron deficiency anemias: Secondary | ICD-10-CM

## 2020-11-28 MED ORDER — DIPHENHYDRAMINE HCL 25 MG PO CAPS
ORAL_CAPSULE | ORAL | Status: AC
  Filled 2020-11-28: qty 2

## 2020-11-28 MED ORDER — SODIUM CHLORIDE 0.9 % IV SOLN
200.0000 mg | Freq: Once | INTRAVENOUS | Status: AC
  Administered 2020-11-28: 10:00:00 200 mg via INTRAVENOUS
  Filled 2020-11-28: qty 200

## 2020-11-28 MED ORDER — ACETAMINOPHEN 325 MG PO TABS
650.0000 mg | ORAL_TABLET | Freq: Once | ORAL | Status: AC
  Administered 2020-11-28: 09:00:00 650 mg via ORAL

## 2020-11-28 MED ORDER — SODIUM CHLORIDE 0.9 % IV SOLN
Freq: Once | INTRAVENOUS | Status: AC
  Filled 2020-11-28: qty 250

## 2020-11-28 MED ORDER — DIPHENHYDRAMINE HCL 25 MG PO CAPS
50.0000 mg | ORAL_CAPSULE | Freq: Once | ORAL | Status: AC
  Administered 2020-11-28: 09:00:00 50 mg via ORAL

## 2020-11-28 MED ORDER — ACETAMINOPHEN 325 MG PO TABS
ORAL_TABLET | ORAL | Status: AC
  Filled 2020-11-28: qty 2

## 2020-11-28 NOTE — Patient Instructions (Signed)

## 2020-12-05 ENCOUNTER — Ambulatory Visit: Payer: BC Managed Care – PPO

## 2020-12-12 ENCOUNTER — Inpatient Hospital Stay: Payer: BC Managed Care – PPO | Attending: Physician Assistant

## 2020-12-12 ENCOUNTER — Other Ambulatory Visit: Payer: Self-pay

## 2020-12-12 VITALS — BP 114/79 | HR 72 | Temp 98.6°F | Resp 16

## 2020-12-12 DIAGNOSIS — G4730 Sleep apnea, unspecified: Secondary | ICD-10-CM | POA: Insufficient documentation

## 2020-12-12 DIAGNOSIS — F32A Depression, unspecified: Secondary | ICD-10-CM | POA: Insufficient documentation

## 2020-12-12 DIAGNOSIS — Z79899 Other long term (current) drug therapy: Secondary | ICD-10-CM | POA: Insufficient documentation

## 2020-12-12 DIAGNOSIS — E785 Hyperlipidemia, unspecified: Secondary | ICD-10-CM | POA: Diagnosis not present

## 2020-12-12 DIAGNOSIS — I10 Essential (primary) hypertension: Secondary | ICD-10-CM | POA: Insufficient documentation

## 2020-12-12 DIAGNOSIS — D509 Iron deficiency anemia, unspecified: Secondary | ICD-10-CM | POA: Insufficient documentation

## 2020-12-12 DIAGNOSIS — D508 Other iron deficiency anemias: Secondary | ICD-10-CM

## 2020-12-12 DIAGNOSIS — K219 Gastro-esophageal reflux disease without esophagitis: Secondary | ICD-10-CM | POA: Diagnosis not present

## 2020-12-12 MED ORDER — DIPHENHYDRAMINE HCL 25 MG PO CAPS
50.0000 mg | ORAL_CAPSULE | Freq: Once | ORAL | Status: AC
  Administered 2020-12-12: 50 mg via ORAL

## 2020-12-12 MED ORDER — ACETAMINOPHEN 325 MG PO TABS
650.0000 mg | ORAL_TABLET | Freq: Once | ORAL | Status: AC
  Administered 2020-12-12: 650 mg via ORAL

## 2020-12-12 MED ORDER — SODIUM CHLORIDE 0.9 % IV SOLN
Freq: Once | INTRAVENOUS | Status: AC
  Filled 2020-12-12: qty 250

## 2020-12-12 MED ORDER — ACETAMINOPHEN 325 MG PO TABS
ORAL_TABLET | ORAL | Status: AC
  Filled 2020-12-12: qty 2

## 2020-12-12 MED ORDER — PALONOSETRON HCL INJECTION 0.25 MG/5ML
INTRAVENOUS | Status: AC
  Filled 2020-12-12: qty 5

## 2020-12-12 MED ORDER — ATROPINE SULFATE 1 MG/ML IJ SOLN
INTRAMUSCULAR | Status: AC
  Filled 2020-12-12: qty 1

## 2020-12-12 MED ORDER — SODIUM CHLORIDE 0.9 % IV SOLN
200.0000 mg | Freq: Once | INTRAVENOUS | Status: AC
  Administered 2020-12-12: 200 mg via INTRAVENOUS
  Filled 2020-12-12: qty 200

## 2020-12-12 MED ORDER — METHYLPREDNISOLONE SODIUM SUCC 40 MG IJ SOLR
INTRAMUSCULAR | Status: AC
  Filled 2020-12-12: qty 1

## 2020-12-12 MED ORDER — ACETAMINOPHEN 325 MG PO TABS
ORAL_TABLET | ORAL | Status: AC
  Filled 2020-12-12: qty 1

## 2020-12-12 MED ORDER — DIPHENHYDRAMINE HCL 25 MG PO CAPS
ORAL_CAPSULE | ORAL | Status: AC
  Filled 2020-12-12: qty 2

## 2020-12-12 NOTE — Progress Notes (Signed)
Immediately after giving patient acetaminophen and benadryl, patient began to vomit. Denied nausea. States, "Since I had my lap band, this happens sometimes." Symptoms quickly resolved and patient returned to baseline. Informed [name] Heilingoetter, PA-C of incident. Advised to proceed with iron infusion without additional premeds. Patient tolerated infusion well with no adverse reactions. Patient remained in infusion room for 30 minutes for post-infusion observation with no adverse events.

## 2020-12-12 NOTE — Patient Instructions (Signed)

## 2020-12-15 ENCOUNTER — Other Ambulatory Visit: Payer: Self-pay | Admitting: Internal Medicine

## 2020-12-15 NOTE — Progress Notes (Signed)
[name] [address] [place] [address] Phone: [phone] Subjective:   Katherine Tucker, am serving as a scribe for Dr. Antoine Primas. This visit occurred during the SARS-CoV-2 public health emergency.  Safety protocols were in place, including screening questions prior to the visit, additional usage of staff PPE, and extensive cleaning of exam room while observing appropriate contact time as indicated for disinfecting solutions.   I'm seeing this patient by the request  of:  [name], MD  CC: Left knee pain follow-up, left foot pain  BJS:[id number]   10/22/2020 Moderate to severe overall.  X-rays are pending today.  Encourage patient to consider physical therapy which she declined.  We will start with the home exercises and reprinted the exercises today for her.  Encouraged her to use the brace on a more regular basis with any type of working on her long walks.  Follow-up with me again 6 to 8 weeks continuing to have trouble we will need to consider formal physical therapy, injections, or advanced imaging for any instability.   Update 12/16/2020 Katherine Tucker is a 50 y.o. female coming in with complaint of left knee pain. Has been wearing brace with activity. Using elliptical machine instead of walking.  Mild discomfort overall but nothing severe  Patient states that has been having pain over the 5th metatarsal. Pain only when standing on foot.  Patient states that it is on the bottom of the foot.  Does not remember any true injury.  States that it seems to be worsening over the course of time now.  Trying to stay active but finding it difficult    xrays mild OA of the medial and lateral joint space and moderate to severe of the PF space   Past Medical History:  Diagnosis Date  . Anxiety   . Chicken pox   . Depression   . Elevated prolactin level    elevated in 2009. MRI brain normal  . GERD (gastroesophageal reflux disease)   .  Headache(784.0)   . Hiatal hernia   . Hyperlipidemia   . Hypertension   . IDA (iron deficiency anemia)   . Mammary duct ectasia of right breast 2009   excision of duct - Dr. Davina Poke 2010 - nonmalignant path  . Morbid obesity (HCC)   . Sleep apnea with use of continuous positive airway pressure (CPAP)    Past Surgical History:  Procedure Laterality Date  . BLADDER SURGERY     childhood  . breast lesion excison Right Jan 2010   Dr. Luisa Hart. Ductal ectasia  . lapband procedure  5-11   [facility]  . MOUTH SURGERY     Social History   Socioeconomic History  . Marital status: Single    Spouse name: Not on file  . Number of children: 0  . Years of education: 64  . Highest education level: Not on file  Occupational History  . 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 Smoking status: Never Smoker  . Smokeless tobacco: Never Used  Substance and Sexual Activity  . Alcohol use: Yes    Comment: 1 every 60 days  . Drug use: No  . Sexual activity: Never  Other Topics Concern  . Not on file  Social History Narrative   HSG, Completed Field seismologist, [place]  [employer]. 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 Remains active and in touch with her sorority sisters. Her mother lives in [place] and they are close.          Social Determinants of Health   Financial Resource Strain: Not on file  Food Insecurity: Not on file  Transportation Needs: Not on file  Physical Activity: Not on file  Stress: Not on file  Social Connections: Not on file   Allergies  Allergen Reactions  . Contrast Media [Iodinated Diagnostic Agents] Nausea And Vomiting    Severe nausea and vomiting   Family History  Problem Relation Age of Onset  . Arthritis Mother   . Hypertension Mother   . Hyperlipidemia  Mother   . Diabetes Mother   . Hypertension Father   . Hyperlipidemia Father   . Prostate cancer Father   . Diabetes Father   . Breast cancer Paternal Aunt   . Diabetes Paternal Grandmother   . Colon cancer Neg Hx   . Esophageal cancer Neg Hx   . Rectal cancer Neg Hx   . Stomach cancer Neg Hx      Current Outpatient Medications (Cardiovascular):  .  metoprolol tartrate (LOPRESSOR) 25 MG tablet, TAKE 1 TABLET (25 MG TOTAL) BY MOUTH 2 (TWO) TIMES DAILY. .  rosuvastatin (CRESTOR) 10 MG tablet, Take 1 tablet (10 mg total) by mouth daily.   Current Outpatient Medications (Analgesics):  Marland Kitchen  CVS ASPIRIN LOW DOSE 81 MG EC tablet, TAKE 1 TABLET (81 MG TOTAL) BY MOUTH DAILY. SWALLOW WHOLE.  Current Outpatient Medications (Hematological):  Marland Kitchen  FERREX 150 150 MG capsule, TAKE 1 CAPSULE BY MOUTH EVERY DAY  Current Outpatient Medications (Other):  Marland Kitchen  Calcium 1500 MG tablet, Take 1,500 mg by mouth daily. .  citalopram (CELEXA) 40 MG tablet, Take 1 tablet (40 mg total) by mouth daily. .  fluconazole (DIFLUCAN) 150 MG tablet, 1 tab by mouth every 3 days as needed .  multivitamin (THERAGRAN) tablet, Take 1 tablet by mouth daily. Centrum [place]. .  pantoprazole (PROTONIX) 40 MG tablet, Take 1 tablet (40 mg total) by mouth daily. .  Vitamin D, Ergocalciferol, (DRISDOL) 1.25 MG (50000 UNIT) CAPS capsule, Take 1 capsule (50,000 Units total) by mouth every 7 (seven) days.   Reviewed prior external information including notes and imaging from  primary care provider As well as notes that were available from care everywhere and other healthcare systems.  Past medical history, social, surgical and family history all reviewed in electronic medical record.  No pertanent information unless stated regarding to the chief complaint.   Review of Systems:  No headache, visual changes, nausea, vomiting, diarrhea, constipation, dizziness, abdominal pain, skin rash, fevers, chills, night sweats, weight loss, swollen  lymph nodes, body aches, joint swelling, chest pain, shortness of breath, mood changes. POSITIVE muscle aches  Objective  Blood pressure 100/62, pulse 72, height 5\' 6"  (1.676 m), weight 216 lb (98 kg), SpO2 98 %.   General: No apparent distress alert and oriented x3 mood and affect normal, dressed appropriately.  HEENT: Pupils equal, extraocular movements intact  Respiratory: Patient's speak in full sentences and does not appear short of breath  Cardiovascular: No lower extremity edema, non tender, no erythema  Left knee exam shows the patient has very minimal tenderness to palpation.  Trace effusion noted the patellofemoral joint.  Mild lateral tracking of the patella noted.  No significant instability but does have some mild  pain with valgus and varus force.  Left foot exam shows the patient does have breakdown of the transverse arch.  Pes planus noted.  Patient does have what appears to be a small callus on the plantar aspect of the head of the fifth metatarsal.  No erythema, no significant skin breakdown    Impression and Recommendations:     The above documentation has been reviewed and is accurate and complete Judi Saa, DO

## 2020-12-15 NOTE — Telephone Encounter (Signed)
Please change to OTC Vitamin D3 at 2000 units per day, indefinitely.  

## 2020-12-16 ENCOUNTER — Encounter: Payer: Self-pay | Admitting: Family Medicine

## 2020-12-16 ENCOUNTER — Ambulatory Visit: Payer: Self-pay

## 2020-12-16 ENCOUNTER — Other Ambulatory Visit: Payer: Self-pay

## 2020-12-16 ENCOUNTER — Ambulatory Visit: Payer: BC Managed Care – PPO | Admitting: Family Medicine

## 2020-12-16 VITALS — BP 100/62 | HR 72 | Ht 66.0 in | Wt 216.0 lb

## 2020-12-16 DIAGNOSIS — M79672 Pain in left foot: Secondary | ICD-10-CM

## 2020-12-16 DIAGNOSIS — L84 Corns and callosities: Secondary | ICD-10-CM

## 2020-12-16 DIAGNOSIS — M1712 Unilateral primary osteoarthritis, left knee: Secondary | ICD-10-CM

## 2020-12-16 NOTE — Assessment & Plan Note (Signed)
Patient does have more of a foot callus.  We used a 10 blade and did not take some layers of it off.  Patient did have some very mild liquid nitrogen placed.  We will start wart remover cream to soften the skin to do it daily for 1 week and then try to bring down the skin somewhat.  Discussed watching for any other signs that would change.  Discussed we will need to support the metatarsals with a metatarsal pad and discussed over-the-counter orthotics.  Discussed proper lacing of shoes and follow-up with me again 4 to 6 weeks

## 2020-12-16 NOTE — Patient Instructions (Addendum)
[facility] Total Support Wart removal cream daily for one week, after one week use Ped Egg to file skin Knee looks good See me in 2 months

## 2020-12-16 NOTE — Assessment & Plan Note (Signed)
Stable at the moment.  Continues to make some improvement.  No significant change in management at this time.  Follow-up again in 2 to 3 months if having worsening pain.  Patient has done everything else right and would need to consider the possibility of MRI if this continues.

## 2020-12-19 ENCOUNTER — Inpatient Hospital Stay: Payer: BC Managed Care – PPO

## 2021-01-06 ENCOUNTER — Other Ambulatory Visit: Payer: Self-pay

## 2021-01-06 ENCOUNTER — Inpatient Hospital Stay: Payer: BC Managed Care – PPO | Admitting: Internal Medicine

## 2021-01-06 ENCOUNTER — Inpatient Hospital Stay: Payer: BC Managed Care – PPO

## 2021-01-06 VITALS — BP 103/66 | HR 64 | Temp 98.8°F | Resp 13 | Wt 201.5 lb

## 2021-01-06 DIAGNOSIS — D500 Iron deficiency anemia secondary to blood loss (chronic): Secondary | ICD-10-CM

## 2021-01-06 DIAGNOSIS — D509 Iron deficiency anemia, unspecified: Secondary | ICD-10-CM | POA: Diagnosis not present

## 2021-01-06 LAB — IRON AND TIBC
Iron: 133 ug/dL (ref 41–142)
Saturation Ratios: 52 % (ref 21–57)
TIBC: 256 ug/dL (ref 236–444)
UIBC: 124 ug/dL (ref 120–384)

## 2021-01-06 LAB — FERRITIN: Ferritin: 71 ng/mL (ref 11–307)

## 2021-01-06 LAB — CBC WITH DIFFERENTIAL (CANCER CENTER ONLY)
Abs Immature Granulocytes: 0 10*3/uL (ref 0.00–0.07)
Basophils Absolute: 0 10*3/uL (ref 0.0–0.1)
Basophils Relative: 1 %
Eosinophils Absolute: 0.2 10*3/uL (ref 0.0–0.5)
Eosinophils Relative: 3 %
HCT: 40.6 % (ref 36.0–46.0)
Hemoglobin: 13.2 g/dL (ref 12.0–15.0)
Immature Granulocytes: 0 %
Lymphocytes Relative: 28 %
Lymphs Abs: 1.3 10*3/uL (ref 0.7–4.0)
MCH: 30.7 pg (ref 26.0–34.0)
MCHC: 32.5 g/dL (ref 30.0–36.0)
MCV: 94.4 fL (ref 80.0–100.0)
Monocytes Absolute: 0.6 10*3/uL (ref 0.1–1.0)
Monocytes Relative: 13 %
Neutro Abs: 2.6 10*3/uL (ref 1.7–7.7)
Neutrophils Relative %: 55 %
Platelet Count: 210 10*3/uL (ref 150–400)
RBC: 4.3 MIL/uL (ref 3.87–5.11)
RDW: 13.9 % (ref 11.5–15.5)
WBC Count: 4.7 10*3/uL (ref 4.0–10.5)
nRBC: 0 % (ref 0.0–0.2)

## 2021-01-06 NOTE — Progress Notes (Signed)
[facility] [phone]   [phone]  OFFICE PROGRESS NOTE  [name], MD [address] [place] [address]  DIAGNOSIS: Persistent microcytic anemia  PRIOR THERAPY: Iron infusion with Venofer weekly x3 doses.  CURRENT THERAPY: Ferrex 150 mg p.o. daily  INTERVAL HISTORY: Katherine Tucker 50 y.o. female returns to the clinic today for follow-up visit.  The patient is feeling fine today with no concerning complaints.  She completed 3 out of the 4 doses of iron infusion with Venofer.  The patient denied having any significant fatigue or weakness.  She has no bleeding issues.  She has no nausea, vomiting, diarrhea or constipation.  She has no chest pain, shortness of breath, cough or hemoptysis.  She continues to tolerate her treatment with Ferrex fairly well.  She is here today for evaluation and repeat blood work.  MEDICAL HISTORY: Past Medical History:  Diagnosis Date  . Anxiety   . Chicken pox   . Depression   . Elevated prolactin level    elevated in 2009. MRI brain normal  . GERD (gastroesophageal reflux disease)   . Headache(784.0)   . Hiatal hernia   . Hyperlipidemia   . Hypertension   . IDA (iron deficiency anemia)   . Mammary duct ectasia of right breast 2009   excision of duct - Dr. Davina Poke 2010 - nonmalignant path  . Morbid obesity (HCC)   . Sleep apnea with use of continuous positive airway pressure (CPAP)     ALLERGIES:  is allergic to contrast media [iodinated diagnostic agents].  MEDICATIONS:  Current Outpatient Medications  Medication Sig Dispense Refill  . Calcium 1500 MG tablet Take 1,500 mg by mouth daily.    . citalopram (CELEXA) 40 MG tablet Take 1 tablet (40 mg total) by mouth daily. 90 tablet 3  . CVS ASPIRIN LOW DOSE 81 MG EC tablet TAKE 1 TABLET (81 MG TOTAL) BY MOUTH DAILY. SWALLOW WHOLE. 90 tablet 1  . FERREX 150 150 MG capsule TAKE 1 CAPSULE BY MOUTH EVERY DAY 90 capsule 3  . fluconazole  (DIFLUCAN) 150 MG tablet 1 tab by mouth every 3 days as needed 2 tablet 1  . metoprolol tartrate (LOPRESSOR) 25 MG tablet TAKE 1 TABLET (25 MG TOTAL) BY MOUTH 2 (TWO) TIMES DAILY. 180 tablet 3  . multivitamin (THERAGRAN) tablet Take 1 tablet by mouth daily. Centrum [place].    . pantoprazole (PROTONIX) 40 MG tablet Take 1 tablet (40 mg total) by mouth daily. 90 tablet 3  . rosuvastatin (CRESTOR) 10 MG tablet Take 1 tablet (10 mg total) by mouth daily. 90 tablet 3  . Vitamin D, Ergocalciferol, (DRISDOL) 1.25 MG (50000 UNIT) CAPS capsule Take 1 capsule (50,000 Units total) by mouth every 7 (seven) days. 12 capsule 0   No current facility-administered medications for this visit.    SURGICAL HISTORY:  Past Surgical History:  Procedure Laterality Date  . BLADDER SURGERY     childhood  . breast lesion excison Right Jan 2010   Dr. Luisa Hart. Ductal ectasia  . lapband procedure  5-11   [facility]  . MOUTH SURGERY      REVIEW OF SYSTEMS:  A comprehensive review of systems was negative.   PHYSICAL EXAMINATION: General appearance: alert, cooperative and no distress Head: Normocephalic, without obvious abnormality, atraumatic Neck: no adenopathy, no JVD, supple, symmetrical, trachea midline and thyroid not enlarged, symmetric, no tenderness/mass/nodules Lymph nodes: Cervical, supraclavicular, and axillary nodes normal. Resp: clear to  auscultation bilaterally Back: symmetric, no curvature. ROM normal. No CVA tenderness. Cardio: regular rate and rhythm, S1, S2 normal, no murmur, click, rub or gallop GI: soft, non-tender; bowel sounds normal; no masses,  no organomegaly Extremities: extremities normal, atraumatic, no cyanosis or edema  ECOG PERFORMANCE STATUS: 0 - Asymptomatic  Blood pressure 103/66, pulse 64, temperature 98.8 F (37.1 C), temperature source Tympanic, resp. rate 13, weight 201 lb 8 oz (91.4 kg), SpO2 98 %.  LABORATORY DATA: Lab Results  Component Value Date   WBC 7.3 11/05/2020    HGB 12.8 11/05/2020   HCT 38.0 11/05/2020   MCV 90.5 11/05/2020   PLT 198 11/05/2020      Chemistry      Component Value Date/Time   NA 136 11/05/2020 1324   K 3.7 11/05/2020 1324   CL 104 11/05/2020 1324   CO2 26 11/05/2020 1324   BUN 15 11/05/2020 1324   CREATININE 0.91 11/05/2020 1324      Component Value Date/Time   CALCIUM 10.7 (H) 11/05/2020 1324   ALKPHOS 43 11/05/2020 1324   AST 22 11/05/2020 1324   ALT 15 11/05/2020 1324   BILITOT 0.5 11/05/2020 1324       RADIOGRAPHIC STUDIES: [place] LIMITED JOINT SPACE STRUCTURES LOW LEFT(NO LINKED CHARGES)  Result Date: 12/20/2020 Left foot exam shows the patient does have breakdown of the transverse arch.  Pes planus noted.  Patient does have what appears to be a small callus on the plantar aspect of the head of the fifth metatarsal.  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  The patient was treated with iron infusion with Venofer for 3 doses and tolerated it fairly well.  She is also on iron supplement with Ferrex 150 mg p.o. daily and tolerating it well. Her CBC today showed normal hemoglobin and hematocrit but the iron study and ferritin are still pending. I recommended for the patient to continue on observation with repeat CBC, iron study and ferritin in 6 months for evaluation of her disease.  She was also advised to call immediately if she has any concerning symptoms in the interval. The patient voices understanding of current disease status and treatment options and is in agreement with the current care plan.  All questions were answered. The patient knows to call the clinic with any problems, questions or concerns. We can certainly see the patient much sooner if necessary.  Disclaimer: This note was dictated with voice recognition software. Similar  sounding words can inadvertently be transcribed and may not be corrected upon review.

## 2021-01-08 ENCOUNTER — Telehealth: Payer: Self-pay | Admitting: Internal Medicine

## 2021-01-08 NOTE — Telephone Encounter (Signed)
Scheduled per 1/31 los. Called and spoke with pt, confirmed 7/27 appts

## 2021-02-12 NOTE — Progress Notes (Signed)
[facility] [address] [place] [address] Phone: [phone] Subjective:   Katherine Tucker, am serving as a scribe for Dr. Antoine Primas. This visit occurred during the SARS-CoV-2 public health emergency.  Safety protocols were in place, including screening questions prior to the visit, additional usage of staff PPE, and extensive cleaning of exam room while observing appropriate contact time as indicated for disinfecting solutions.   I'm seeing this patient by the request  of:  [name], MD  CC: left foot pain and knee pain follow up   [id number]   12/16/2020 Stable at the moment.  Continues to make some improvement.  No significant change in management at this time.  Follow-up again in 2 to 3 months if having worsening pain.  Patient has done everything else right and would need to consider the possibility of MRI if this continues.  Patient does have more of a foot callus.  We used a 10 blade and did not take some layers of it off.  Patient did have some very mild liquid nitrogen placed.  We will start wart remover cream to soften the skin to do it daily for 1 week and then try to bring down the skin somewhat.  Discussed watching for any other signs that would change.  Discussed we will need to support the metatarsals with a metatarsal pad and discussed over-the-counter orthotics.  Discussed proper lacing of shoes and follow-up with me again 4 to 6 weeks  Update 02/13/2021 Katherine Tucker is a 50 y.o. female coming in with complaint of left knee and left foot pain. Foot has improved since last visit.   Has to ice knee after workout due to soreness. Has been wearing brace for workouts. Knee pain is about the same as last visit but she has just started to workout again.   Also having pain in right elbow, over lateral epicondyle.  Notices when she picks up a heavy bin of files at work or lifts her coffee mug overhand with fingers abducted that she  has pain in elbow.   Left knee xray 10/22/2020  IMPRESSION: Tricompartment osteophytosis.     Past Medical History:  Diagnosis Date  . Anxiety   . Chicken pox   . Depression   . Elevated prolactin level    elevated in 2009. MRI brain normal  . GERD (gastroesophageal reflux disease)   . Headache(784.0)   . Hiatal hernia   . Hyperlipidemia   . Hypertension   . IDA (iron deficiency anemia)   . Mammary duct ectasia of right breast 2009   excision of duct - Dr. Davina Poke 2010 - nonmalignant path  . Morbid obesity (HCC)   . Sleep apnea with use of continuous positive airway pressure (CPAP)    Past Surgical History:  Procedure Laterality Date  . BLADDER SURGERY     childhood  . breast lesion excison Right Jan 2010   Dr. Luisa Hart. Ductal ectasia  . lapband procedure  5-11   [facility]  . MOUTH SURGERY     Social History   Socioeconomic History  . Marital status: Single    Spouse name: Not on file  . Number of children: 0  . Years of education: 45  . Highest education level: Not on file  Occupational History  . 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 Smoking status: Never Smoker  . Smokeless tobacco: Never Used  Substance and Sexual Activity  . Alcohol use: Yes    Comment: 1 every 60 days  . Drug use: No  . Sexual activity: Never  Other Topics Concern  . Not on file  Social History Narrative   HSG, Completed Field seismologist, [employer]. 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 Remains active and in touch with her sorority sisters. Her mother lives in [place] and they are close.          Social Determinants of Health   Financial Resource Strain: Not on file  Food Insecurity: Not on file  Transportation Needs: Not on file  Physical Activity: Not on file   Stress: Not on file  Social Connections: Not on file   Allergies  Allergen Reactions  . Contrast Media [Iodinated Diagnostic Agents] Nausea And Vomiting    Severe nausea and vomiting   Family History  Problem Relation Age of Onset  . Arthritis Mother   . Hypertension Mother   . Hyperlipidemia Mother   . Diabetes Mother   . Hypertension Father   . Hyperlipidemia Father   . Prostate cancer Father   . Diabetes Father   . Breast cancer Paternal Aunt   . Diabetes Paternal Grandmother   . Colon cancer Neg Hx   . Esophageal cancer Neg Hx   . Rectal cancer Neg Hx   . Stomach cancer Neg Hx      Current Outpatient Medications (Cardiovascular):  .  metoprolol tartrate (LOPRESSOR) 25 MG tablet, TAKE 1 TABLET (25 MG TOTAL) BY MOUTH 2 (TWO) TIMES DAILY. .  rosuvastatin (CRESTOR) 10 MG tablet, Take 1 tablet (10 mg total) by mouth daily.   Current Outpatient Medications (Analgesics):  Marland Kitchen  CVS ASPIRIN LOW DOSE 81 MG EC tablet, TAKE 1 TABLET (81 MG TOTAL) BY MOUTH DAILY. SWALLOW WHOLE.  Current Outpatient Medications (Hematological):  Marland Kitchen  FERREX 150 150 MG capsule, TAKE 1 CAPSULE BY MOUTH EVERY DAY  Current Outpatient Medications (Other):  Marland Kitchen  Calcium 1500 MG tablet, Take 1,500 mg by mouth daily. .  citalopram (CELEXA) 40 MG tablet, Take 1 tablet (40 mg total) by mouth daily. .  fluconazole (DIFLUCAN) 150 MG tablet, 1 tab by mouth every 3 days as needed .  multivitamin (THERAGRAN) tablet, Take 1 tablet by mouth daily. Centrum [place]. .  pantoprazole (PROTONIX) 40 MG tablet, Take 1 tablet (40 mg total) by mouth daily. .  Vitamin D, Ergocalciferol, (DRISDOL) 1.25 MG (50000 UNIT) CAPS capsule, Take 1 capsule (50,000 Units total) by mouth every 7 (seven) days.   Reviewed prior external information including notes and imaging from  primary care provider As well as notes that were available from care everywhere and other healthcare systems.  Past medical history, social, surgical and family  history all reviewed in electronic medical record.  No pertanent information unless stated regarding to the chief complaint.   Review of Systems:  No headache, visual changes, nausea, vomiting, diarrhea, constipation, dizziness, abdominal pain, skin rash, fevers, chills, night sweats, weight loss, swollen lymph nodes, body aches, joint swelling, chest pain, shortness of breath, mood changes. POSITIVE muscle aches  Objective  Blood pressure 96/68, pulse 74, height 5\' 6"  (1.676 m), weight 204 lb (92.5 kg), SpO2 97 %.   General: No apparent distress alert and oriented x3 mood and affect normal, dressed appropriately.  HEENT: Pupils  equal, extraocular movements intact  Respiratory: Patient's speak in full sentences and does not appear short of breath  Cardiovascular: No lower extremity edema, non tender, no erythema  Gait normal with good balance and coordination.  MSK:   Left knee exam still has some mild lateral tracking of the patella noted.  Mild positive patellar grind test.  Patient has very mild discomfort with McMurray's.  Full range of motion of all of the knee noted.  Right elbow exam shows the patient is tender to palpation over the lateral epicondylar region.  Patient does have some pain with resisted wrist extension.  Full range of motion of the elbow no noted otherwise.    Impression and Recommendations:     The above documentation has been reviewed and is accurate and complete Judi Saa, DO

## 2021-02-13 ENCOUNTER — Other Ambulatory Visit: Payer: Self-pay

## 2021-02-13 ENCOUNTER — Encounter: Payer: Self-pay | Admitting: Family Medicine

## 2021-02-13 ENCOUNTER — Ambulatory Visit: Payer: BC Managed Care – PPO | Admitting: Family Medicine

## 2021-02-13 DIAGNOSIS — M1712 Unilateral primary osteoarthritis, left knee: Secondary | ICD-10-CM | POA: Diagnosis not present

## 2021-02-13 DIAGNOSIS — M7711 Lateral epicondylitis, right elbow: Secondary | ICD-10-CM

## 2021-02-13 NOTE — Assessment & Plan Note (Signed)
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  Patient though is able to workout fairly regularly but does need to ice.  We discussed different treatment options including formal physical therapy, injections or even MRI.  Patient declined all of these and would like to continue with conservative therapy and will follow up again in 2 weeks.

## 2021-02-13 NOTE — Assessment & Plan Note (Signed)
Elbow anatomy was reviewed, and tendinopathy was explained.  Pt. given a home rehab program. Start with isometrics and ROM, then a series of concentric and eccentric exercises should be done starting with no weight, work up to 1 lb, hammer, etc.  Use counterforce strap if working or using hands.  Formal PT would be beneficial. Emphasized stretching an cross-friction massage Emphasized proper palms up lifting biomechanics to unload ECRB Patient given exercises today.  Patient will hold on any type of physical therapy at this point.  Will consider in the future.  Follow-up again in 2 months

## 2021-02-13 NOTE — Patient Instructions (Addendum)
Underhand lifting or have your partner carry the files Ice 20 min 2x a day Stretch after lifting Give knee more time Follow up in 6-8 weeks

## 2021-03-02 ENCOUNTER — Encounter: Payer: Self-pay | Admitting: Family Medicine

## 2021-03-03 ENCOUNTER — Encounter: Payer: Self-pay | Admitting: Internal Medicine

## 2021-03-03 ENCOUNTER — Telehealth (INDEPENDENT_AMBULATORY_CARE_PROVIDER_SITE_OTHER): Payer: BC Managed Care – PPO | Admitting: Internal Medicine

## 2021-03-03 DIAGNOSIS — R5383 Other fatigue: Secondary | ICD-10-CM | POA: Diagnosis not present

## 2021-03-03 DIAGNOSIS — J029 Acute pharyngitis, unspecified: Secondary | ICD-10-CM | POA: Diagnosis not present

## 2021-03-03 DIAGNOSIS — M2550 Pain in unspecified joint: Secondary | ICD-10-CM | POA: Diagnosis not present

## 2021-03-03 MED ORDER — AMOXICILLIN-POT CLAVULANATE 875-125 MG PO TABS: 1.0000 | ORAL_TABLET | Freq: Two times a day (BID) | ORAL | 0 refills | Status: DC

## 2021-03-03 MED ORDER — FLUCONAZOLE 150 MG PO TABS: 150.0000 mg | ORAL_TABLET | Freq: Once | ORAL | 0 refills | Status: AC

## 2021-03-03 MED ORDER — METHYLPREDNISOLONE 4 MG PO TBPK: ORAL_TABLET | ORAL | 0 refills | Status: DC

## 2021-03-03 MED ORDER — HYDROCOD POLST-CPM POLST ER 10-8 MG/5ML PO SUER: 5.0000 mL | Freq: Two times a day (BID) | ORAL | 0 refills | Status: DC | PRN

## 2021-03-03 NOTE — Progress Notes (Signed)
Virtual Visit via Video Note  I connected with Katherine Tucker on 03/03/21 at  1:45 PM EDT by a video enabled telemedicine application and verified that I am speaking with the correct person using two identifiers.   I discussed the limitations of evaluation and management by telemedicine and the availability of in person appointments. The patient expressed understanding and agreed to proceed.  Present for the visit:  Myself, Dr Cheryll Cockayne, Thomas Hoff.  The patient is currently at home and I am in the office.    No referring provider.    History of Present Illness: This is an acute visit for sore throat and body aches.  Her symptoms started 4 days ago.  Every joint in her body hurts.  From her jaw to her feet.  She has a sore throat that she can not get rid of.  She tried taking theraflu three days w/o improvement.   She took a covid home test and it was neg.    She has pain with swallowing.  She can only drink hot fluids.  It hurts a lot to swallow.  She had to take aleve to get her joints to work.  All her joints feel swollen.    She works in the court house.  Mask mandate just ended - she is not sure who she was exposed to.  No obvious sick contact.    Review of Systems  Constitutional: Positive for malaise/fatigue. Negative for chills and fever (feeling hot/cold).       No appetite  HENT: Positive for sore throat. Negative for congestion and sinus pain.   Respiratory: Positive for cough and sputum production (at times).   Cardiovascular: Negative for chest pain (sore - feels muscular).  Gastrointestinal: Positive for nausea.  Musculoskeletal: Positive for joint pain.  Skin: Negative for rash.  Neurological: Positive for headaches.      Social History   Socioeconomic History  . Marital status: Single    Spouse name: Not on file  . Number of children: 0  . Years of education: 31  . Highest education level: Not on file  Occupational History  . 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 Smoking status: Never Smoker  . Smokeless tobacco: Never Used  Substance and Sexual Activity  . Alcohol use: Yes    Comment: 1 every 60 days  . Drug use: No  . Sexual activity: Never  Other Topics Concern  . Not on file  Social History Narrative   HSG, Completed Field seismologist, [employer]. 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 Remains active and in touch with her sorority sisters. Her mother lives in [place] and they are close.          Social Determinants of Health   Financial Resource Strain: Not on file  Food Insecurity: Not on file  Transportation Needs: Not on file  Physical Activity: Not on file  Stress: Not on file  Social Connections: Not on file     Observations/Objective: Appears well in NAD Breathing normally  Assessment and Plan:  Acute pharyngitis, joint pain, fatigue: Acute Symptoms more severe than typical URI Concern for strep or other infectious disease Unfortunately since this is a virtual visit we are limited in what w/u we can do today  Start augmentin 875-125 mg BID x 10 days, medrol dose pak,  tussionex cough syrup If no improvement in next 24-48 hours she will need to come in for further evaluation   Follow Up Instructions:    I discussed the assessment and treatment plan with the patient. The patient was provided an opportunity to ask questions and all were answered. The patient agreed with the plan and demonstrated an understanding of the instructions.   The patient was advised to call back or seek an in-person evaluation if the symptoms worsen or if the condition fails to improve as anticipated.    Pincus Sanes, MD

## 2021-04-09 ENCOUNTER — Ambulatory Visit: Payer: BC Managed Care – PPO | Admitting: Family Medicine

## 2021-04-09 NOTE — Progress Notes (Deleted)
[facility] [address] [place] [address] Phone: [phone] Subjective:    I'm seeing this patient by the request  of:  [name], MD  CC:   [id number]   02/13/2021 Elbow anatomy was reviewed, and tendinopathy was explained.  Pt. given a home rehab program. Start with isometrics and ROM, then a series of concentric and eccentric exercises should be done starting with no weight, work up to 1 lb, hammer, etc.  Use counterforce strap if working or using hands.  Formal PT would be beneficial. Emphasized stretching an cross-friction massage Emphasized proper palms up lifting biomechanics to unload ECRB Patient given exercises today.  Patient will hold on any type of physical therapy at this point.  Will consider in the future.  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  Patient though is able to workout fairly regularly but does need to ice.  We discussed different treatment options including formal physical therapy, injections or even MRI.  Patient declined all of these and would like to continue with conservative therapy and will follow up again in 2 weeks.  Update 04/09/2021 [name] is a 50 y.o. female coming in with complaint of L knee and R elbow pain. Patient states   Onset-  Location Duration-  Character- Aggravating factors- Reliving factors-  Therapies tried-  Severity-     Past Medical History:  Diagnosis Date  . Anxiety   . Chicken pox   . Depression   . Elevated prolactin level    elevated in 2009. MRI brain normal  . GERD (gastroesophageal reflux disease)   . Headache(784.0)   . Hiatal hernia   . Hyperlipidemia   . Hypertension   . IDA (iron deficiency anemia)   . Mammary duct ectasia of right breast 2009   excision of duct - Dr. Davina Poke 2010 - nonmalignant path  . Morbid obesity (HCC)   . Sleep apnea with use of continuous positive airway pressure (CPAP)     Past Surgical History:  Procedure Laterality Date  . BLADDER SURGERY     childhood  . breast lesion excison Right Jan 2010   Dr. Luisa Hart. Ductal ectasia  . lapband procedure  5-11   [facility]  . MOUTH SURGERY     Social History   Socioeconomic History  . Marital status: Single    Spouse name: Not on file  . Number of children: 0  . Years of education: 56  . Highest education level: Not on file  Occupational History  . Occupation: Scientist, research (medical): [employer]    Comment: Mudlogger  . 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 Smoking status: Never Smoker  . Smokeless tobacco: Never Used  Substance and Sexual Activity  . Alcohol use: Yes    Comment: 1 every 60 days  . Drug use: No  . Sexual activity: Never  Other Topics Concern  . Not on file  Social History Narrative   HSG, Completed Field seismologist, [employer]. 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 Remains active and in touch with her sorority sisters. Her mother lives in [place] and they are close.          Social Determinants of Health   Financial Resource Strain: Not on file  Food Insecurity: Not on file  Transportation Needs:  Not on file  Physical Activity: Not on file  Stress: Not on file  Social Connections: Not on file   Allergies  Allergen Reactions  . Contrast Media [Iodinated Diagnostic Agents] Nausea And Vomiting    Severe nausea and vomiting   Family History  Problem Relation Age of Onset  . Arthritis Mother   . Hypertension Mother   . Hyperlipidemia Mother   . Diabetes Mother   . Hypertension Father   . Hyperlipidemia Father   . Prostate cancer Father   . Diabetes Father   . Breast cancer Paternal Aunt   . Diabetes Paternal Grandmother   . Colon cancer Neg Hx   . Esophageal cancer Neg Hx   . Rectal cancer Neg Hx   . Stomach cancer Neg Hx      Current Outpatient Medications (Endocrine & Metabolic):  .  methylPREDNISolone (MEDROL DOSEPAK) 4 MG TBPK tablet, 24 mg PO on day 1, then decr. by 4 mg/day x5 days  Current Outpatient Medications (Cardiovascular):  .  metoprolol tartrate (LOPRESSOR) 25 MG tablet, TAKE 1 TABLET (25 MG TOTAL) BY MOUTH 2 (TWO) TIMES DAILY. .  rosuvastatin (CRESTOR) 10 MG tablet, Take 1 tablet (10 mg total) by mouth daily.  Current Outpatient Medications (Respiratory):  .  chlorpheniramine-HYDROcodone (TUSSIONEX PENNKINETIC ER) 10-8 MG/5ML SUER, Take 5 mLs by mouth every 12 (twelve) hours as needed for cough.  Current Outpatient Medications (Analgesics):  Marland Kitchen  CVS ASPIRIN LOW DOSE 81 MG EC tablet, TAKE 1 TABLET (81 MG TOTAL) BY MOUTH DAILY. SWALLOW WHOLE.  Current Outpatient Medications (Hematological):  Marland Kitchen  FERREX 150 150 MG capsule, TAKE 1 CAPSULE BY MOUTH EVERY DAY  Current Outpatient Medications (Other):  .  amoxicillin-clavulanate (AUGMENTIN) 875-125 MG tablet, Take 1 tablet by mouth 2 (two) times daily. .  Calcium 1500 MG tablet, Take 1,500 mg by mouth daily. .  citalopram (CELEXA) 40 MG tablet, Take 1 tablet (40 mg total) by mouth daily. .  fluconazole (DIFLUCAN) 150 MG tablet, 1 tab by mouth every 3 days as needed .  multivitamin (THERAGRAN) tablet, Take 1 tablet by mouth daily. Centrum [place]. .  pantoprazole (PROTONIX) 40 MG tablet, Take 1 tablet (40 mg total) by mouth daily. .  Vitamin D, Ergocalciferol, (DRISDOL) 1.25 MG (50000 UNIT) CAPS capsule, Take 1 capsule (50,000 Units total) by mouth every 7 (seven) days.   Reviewed prior external information including notes and imaging from  primary care provider As well as notes that were available from care everywhere and other healthcare systems.  Past medical history, social, surgical and family history all reviewed in electronic medical record.  No pertanent information unless stated regarding to the chief complaint.   Review of Systems:  No  headache, visual changes, nausea, vomiting, diarrhea, constipation, dizziness, abdominal pain, skin rash, fevers, chills, night sweats, weight loss, swollen lymph nodes, body aches, joint swelling, chest pain, shortness of breath, mood changes. POSITIVE muscle aches  Objective  There were no vitals taken for this visit.   General: No apparent distress alert and oriented x3 mood and affect normal, dressed appropriately.  HEENT: Pupils equal, extraocular movements intact  Respiratory: Patient's speak in full sentences and does not appear short of breath  Cardiovascular: No lower extremity edema, non tender, no erythema  Gait normal with good balance and coordination.  MSK:  Non tender with full range of motion and good stability and symmetric strength and tone of shoulders, elbows, wrist, hip, knee and ankles bilaterally.     Impression  and Recommendations:     The above documentation has been reviewed and is accurate and complete Wilford Grist

## 2021-06-06 ENCOUNTER — Ambulatory Visit (HOSPITAL_COMMUNITY): Admit: 2021-06-06 | Discharge status: Against Medical Advice | Payer: BC Managed Care – PPO

## 2021-06-17 ENCOUNTER — Telehealth: Payer: Self-pay | Admitting: Internal Medicine

## 2021-06-17 NOTE — Telephone Encounter (Signed)
R/s 727 appt due to provider on call schedule. Called and spoke with patient. Confirmed new date and time

## 2021-06-19 ENCOUNTER — Other Ambulatory Visit: Payer: Self-pay | Admitting: Obstetrics and Gynecology

## 2021-07-02 ENCOUNTER — Ambulatory Visit: Payer: BC Managed Care – PPO | Admitting: Internal Medicine

## 2021-07-02 ENCOUNTER — Other Ambulatory Visit: Payer: BC Managed Care – PPO

## 2021-07-07 ENCOUNTER — Inpatient Hospital Stay: Payer: BC Managed Care – PPO | Admitting: Internal Medicine

## 2021-07-07 ENCOUNTER — Other Ambulatory Visit: Payer: Self-pay

## 2021-07-07 ENCOUNTER — Inpatient Hospital Stay: Payer: BC Managed Care – PPO | Attending: Internal Medicine

## 2021-07-07 ENCOUNTER — Encounter: Payer: Self-pay | Admitting: Internal Medicine

## 2021-07-07 VITALS — BP 107/73 | HR 74 | Temp 99.2°F | Resp 19 | Ht 66.0 in | Wt 192.3 lb

## 2021-07-07 DIAGNOSIS — D508 Other iron deficiency anemias: Secondary | ICD-10-CM

## 2021-07-07 DIAGNOSIS — Z79899 Other long term (current) drug therapy: Secondary | ICD-10-CM | POA: Diagnosis not present

## 2021-07-07 DIAGNOSIS — D509 Iron deficiency anemia, unspecified: Secondary | ICD-10-CM | POA: Insufficient documentation

## 2021-07-07 DIAGNOSIS — D500 Iron deficiency anemia secondary to blood loss (chronic): Secondary | ICD-10-CM

## 2021-07-07 DIAGNOSIS — I10 Essential (primary) hypertension: Secondary | ICD-10-CM | POA: Insufficient documentation

## 2021-07-07 DIAGNOSIS — Z7982 Long term (current) use of aspirin: Secondary | ICD-10-CM | POA: Diagnosis not present

## 2021-07-07 LAB — CBC WITH DIFFERENTIAL (CANCER CENTER ONLY)
Abs Immature Granulocytes: 0.01 10*3/uL (ref 0.00–0.07)
Basophils Absolute: 0 10*3/uL (ref 0.0–0.1)
Basophils Relative: 0 %
Eosinophils Absolute: 0.1 10*3/uL (ref 0.0–0.5)
Eosinophils Relative: 1 %
HCT: 36.4 % (ref 36.0–46.0)
Hemoglobin: 12.2 g/dL (ref 12.0–15.0)
Immature Granulocytes: 0 %
Lymphocytes Relative: 7 %
Lymphs Abs: 0.3 10*3/uL — ABNORMAL LOW (ref 0.7–4.0)
MCH: 30.9 pg (ref 26.0–34.0)
MCHC: 33.5 g/dL (ref 30.0–36.0)
MCV: 92.2 fL (ref 80.0–100.0)
Monocytes Absolute: 0.4 10*3/uL (ref 0.1–1.0)
Monocytes Relative: 8 %
Neutro Abs: 4.1 10*3/uL (ref 1.7–7.7)
Neutrophils Relative %: 84 %
Platelet Count: 168 10*3/uL (ref 150–400)
RBC: 3.95 MIL/uL (ref 3.87–5.11)
RDW: 13.6 % (ref 11.5–15.5)
WBC Count: 5 10*3/uL (ref 4.0–10.5)
nRBC: 0 % (ref 0.0–0.2)

## 2021-07-07 NOTE — Progress Notes (Signed)
[facility] Telephone:([phone]) [phone]   Fax:(336) [phone]  OFFICE PROGRESS NOTE  [name], MD [address] [place] [address]  DIAGNOSIS: Persistent microcytic anemia  PRIOR THERAPY: Iron infusion with Venofer weekly x3 doses.  CURRENT THERAPY: Ferrex 150 mg p.o. daily  INTERVAL HISTORY: Katherine Tucker 50 y.o. female returns to the clinic today for 25-month follow-up visit.  The patient is feeling fine today with no concerning complaints.  She denied having any current chest pain, shortness of breath, cough or hemoptysis.  She has no nausea, vomiting, diarrhea or constipation.  She has occasional mild fatigue and dizziness.  She has no fever or chills.  She has no headache or visual changes.  She continues to tolerate her treatment with Peridex fairly well.  The patient is here today for evaluation with repeat CBC, iron study and ferritin.  MEDICAL HISTORY: Past Medical History:  Diagnosis Date   Anxiety    Chicken pox    Depression    Elevated prolactin level    elevated in 2009. MRI brain normal   GERD (gastroesophageal reflux disease)    Headache(784.0)    Hiatal hernia    Hyperlipidemia    Hypertension    IDA (iron deficiency anemia)    Mammary duct ectasia of right breast 2009   excision of duct - Dr. Davina Poke 2010 - nonmalignant path   Morbid obesity (HCC)    Sleep apnea with use of continuous positive airway pressure (CPAP)     ALLERGIES:  is allergic to contrast media [iodinated diagnostic agents].  MEDICATIONS:  Current Outpatient Medications  Medication Sig Dispense Refill   Calcium 1500 MG tablet Take 1,500 mg by mouth daily.     citalopram (CELEXA) 40 MG tablet Take 1 tablet (40 mg total) by mouth daily. 90 tablet 3   CVS ASPIRIN LOW DOSE 81 MG EC tablet TAKE 1 TABLET (81 MG TOTAL) BY MOUTH DAILY. SWALLOW WHOLE. 90 tablet 1   FERREX 150 150 MG capsule TAKE 1 CAPSULE BY MOUTH EVERY DAY 90 capsule 3   fluconazole (DIFLUCAN) 150  MG tablet 1 tab by mouth every 3 days as needed 2 tablet 1   metoprolol tartrate (LOPRESSOR) 25 MG tablet TAKE 1 TABLET (25 MG TOTAL) BY MOUTH 2 (TWO) TIMES DAILY. 180 tablet 3   multivitamin (THERAGRAN) tablet Take 1 tablet by mouth daily. Centrum [place].     pantoprazole (PROTONIX) 40 MG tablet Take 1 tablet (40 mg total) by mouth daily. 90 tablet 3   rosuvastatin (CRESTOR) 10 MG tablet Take 1 tablet (10 mg total) by mouth daily. 90 tablet 3   Vitamin D, Ergocalciferol, (DRISDOL) 1.25 MG (50000 UNIT) CAPS capsule Take 1 capsule (50,000 Units total) by mouth every 7 (seven) days. 12 capsule 0   No current facility-administered medications for this visit.    SURGICAL HISTORY:  Past Surgical History:  Procedure Laterality Date   BLADDER SURGERY     childhood   breast lesion excison Right Jan 2010   Dr. Luisa Hart. Ductal ectasia   lapband procedure  5-11   [facility]   MOUTH SURGERY      REVIEW OF SYSTEMS:  A comprehensive review of systems was negative except for: Constitutional: positive for fatigue   PHYSICAL EXAMINATION: General appearance: alert, cooperative, and no distress Head: Normocephalic, without obvious abnormality, atraumatic Neck: no adenopathy, no JVD, supple, symmetrical, trachea midline, and thyroid not enlarged, symmetric, no tenderness/mass/nodules Lymph nodes: Cervical, supraclavicular, and  axillary nodes normal. Resp: clear to auscultation bilaterally Back: symmetric, no curvature. ROM normal. No CVA tenderness. Cardio: regular rate and rhythm, S1, S2 normal, no murmur, click, rub or gallop GI: soft, non-tender; bowel sounds normal; no masses,  no organomegaly Extremities: extremities normal, atraumatic, no cyanosis or edema  ECOG PERFORMANCE STATUS: 0 - Asymptomatic  Blood pressure 107/73, pulse 74, temperature 99.2 F (37.3 C), temperature source Tympanic, resp. rate 19, height 5\' 6"  (1.676 m), weight 192 lb 4.8 oz (87.2 kg), SpO2 100 %.  LABORATORY DATA: Lab  Results  Component Value Date   WBC 5.0 07/07/2021   HGB 12.2 07/07/2021   HCT 36.4 07/07/2021   MCV 92.2 07/07/2021   PLT 168 07/07/2021      Chemistry      Component Value Date/Time   NA 136 11/05/2020 1324   K 3.7 11/05/2020 1324   CL 104 11/05/2020 1324   CO2 26 11/05/2020 1324   BUN 15 11/05/2020 1324   CREATININE 0.91 11/05/2020 1324      Component Value Date/Time   CALCIUM 10.7 (H) 11/05/2020 1324   ALKPHOS 43 11/05/2020 1324   AST 22 11/05/2020 1324   ALT 15 11/05/2020 1324   BILITOT 0.5 11/05/2020 1324       RADIOGRAPHIC STUDIES: No results found.   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  The patient was treated with iron infusion with Venofer for 3 doses and tolerated it fairly well.  She is also on iron supplement with Ferrex 150 mg p.o. daily. She continues to tolerate her treatment with Ferrex fairly well. Repeat CBC today showed normal hemoglobin and hematocrit.  Iron study and ferritin are still pending. I recommended for the patient to continue her current treatment with Ferrex 150 mg p.o. daily and depending on the iron study we will may consider increasing her dose if needed. I will see her back for follow-up visit in 6 months with repeat CBC, iron study and ferritin. She was advised to call if she has any concerning symptoms in the interval especially any significant fatigue. The patient voices understanding of current disease status and treatment options and is in agreement with the current care plan.  All questions were answered. The patient knows to call the clinic with any problems, questions or concerns. We can certainly see the patient much sooner if necessary.  Disclaimer: This note was dictated with voice recognition software. Similar sounding words can inadvertently be transcribed and may not be corrected upon  review.

## 2021-07-08 LAB — IRON AND TIBC
Iron: 64 ug/dL (ref 41–142)
Saturation Ratios: 24 % (ref 21–57)
TIBC: 268 ug/dL (ref 236–444)
UIBC: 204 ug/dL (ref 120–384)

## 2021-07-09 LAB — FERRITIN: Ferritin: 25 ng/mL (ref 11–307)

## 2021-07-10 ENCOUNTER — Encounter: Payer: Self-pay | Admitting: Internal Medicine

## 2021-07-10 ENCOUNTER — Telehealth: Payer: Self-pay | Admitting: Internal Medicine

## 2021-07-10 NOTE — Telephone Encounter (Signed)
Scheduled per los. Called, not able to leave msg.  

## 2021-07-18 ENCOUNTER — Encounter: Payer: BC Managed Care – PPO | Admitting: Internal Medicine

## 2021-07-18 ENCOUNTER — Other Ambulatory Visit: Payer: Self-pay | Admitting: Internal Medicine

## 2021-07-19 NOTE — Telephone Encounter (Signed)
All meds refilled x 1 mo  Please to contact pt - for rov for further refils

## 2021-08-02 ENCOUNTER — Other Ambulatory Visit: Payer: Self-pay | Admitting: Internal Medicine

## 2021-08-02 NOTE — Telephone Encounter (Signed)
Please refill as per office routine med refill policy (all routine meds to be refilled for 3 mo or monthly (per pt preference) up to one year from last visit, then month to month grace period for 3 mo, then further med refills will have to be denied) ? ?

## 2021-08-04 ENCOUNTER — Encounter: Payer: Self-pay | Admitting: Internal Medicine

## 2021-08-04 ENCOUNTER — Ambulatory Visit (INDEPENDENT_AMBULATORY_CARE_PROVIDER_SITE_OTHER): Payer: BC Managed Care – PPO | Admitting: Internal Medicine

## 2021-08-04 ENCOUNTER — Other Ambulatory Visit: Payer: Self-pay

## 2021-08-04 VITALS — BP 110/60 | HR 64 | Temp 99.0°F | Ht 66.0 in | Wt 185.0 lb

## 2021-08-04 DIAGNOSIS — E559 Vitamin D deficiency, unspecified: Secondary | ICD-10-CM | POA: Insufficient documentation

## 2021-08-04 DIAGNOSIS — R739 Hyperglycemia, unspecified: Secondary | ICD-10-CM | POA: Diagnosis not present

## 2021-08-04 DIAGNOSIS — D508 Other iron deficiency anemias: Secondary | ICD-10-CM | POA: Diagnosis not present

## 2021-08-04 DIAGNOSIS — Z0001 Encounter for general adult medical examination with abnormal findings: Secondary | ICD-10-CM

## 2021-08-04 DIAGNOSIS — E7800 Pure hypercholesterolemia, unspecified: Secondary | ICD-10-CM

## 2021-08-04 DIAGNOSIS — E538 Deficiency of other specified B group vitamins: Secondary | ICD-10-CM

## 2021-08-04 LAB — LIPID PANEL
Cholesterol: 134 mg/dL (ref 0–200)
HDL: 64.6 mg/dL (ref >39.00)
LDL Cholesterol: 61 mg/dL (ref 0–99)
NonHDL: 69.34
Total CHOL/HDL Ratio: 2
Triglycerides: 41 mg/dL (ref 0.0–149.0)
VLDL: 8.2 mg/dL (ref 0.0–40.0)

## 2021-08-04 LAB — HEPATIC FUNCTION PANEL
ALT: 11 U/L (ref 0–35)
AST: 18 U/L (ref 0–37)
Albumin: 3.9 g/dL (ref 3.5–5.2)
Alkaline Phosphatase: 38 U/L — ABNORMAL LOW (ref 39–117)
Bilirubin, Direct: 0.1 mg/dL (ref 0.0–0.3)
Total Bilirubin: 0.4 mg/dL (ref 0.2–1.2)
Total Protein: 6.9 g/dL (ref 6.0–8.3)

## 2021-08-04 LAB — BASIC METABOLIC PANEL
BUN: 13 mg/dL (ref 6–23)
CO2: 30 mEq/L (ref 19–32)
Calcium: 10.6 mg/dL — ABNORMAL HIGH (ref 8.4–10.5)
Chloride: 100 mEq/L (ref 96–112)
Creatinine, Ser: 1.07 mg/dL (ref 0.40–1.20)
GFR: 61.71 mL/min (ref >60.00)
Glucose, Bld: 85 mg/dL (ref 70–99)
Potassium: 3.7 mEq/L (ref 3.5–5.1)
Sodium: 137 mEq/L (ref 135–145)

## 2021-08-04 LAB — CBC WITH DIFFERENTIAL/PLATELET
Basophils Absolute: 0 10*3/uL (ref 0.0–0.1)
Basophils Relative: 0.6 % (ref 0.0–3.0)
Eosinophils Absolute: 0.2 10*3/uL (ref 0.0–0.7)
Eosinophils Relative: 4.9 % (ref 0.0–5.0)
HCT: 36.6 % (ref 36.0–46.0)
Hemoglobin: 12.5 g/dL (ref 12.0–15.0)
Lymphocytes Relative: 28.7 % (ref 12.0–46.0)
Lymphs Abs: 1.1 10*3/uL (ref 0.7–4.0)
MCHC: 34.1 g/dL (ref 30.0–36.0)
MCV: 90.8 fl (ref 78.0–100.0)
Monocytes Absolute: 0.6 10*3/uL (ref 0.1–1.0)
Monocytes Relative: 15.7 % — ABNORMAL HIGH (ref 3.0–12.0)
Neutro Abs: 1.9 10*3/uL (ref 1.4–7.7)
Neutrophils Relative %: 50.1 % (ref 43.0–77.0)
Platelets: 214 10*3/uL (ref 150.0–400.0)
RBC: 4.04 Mil/uL (ref 3.87–5.11)
RDW: 14.7 % (ref 11.5–15.5)
WBC: 3.7 10*3/uL — ABNORMAL LOW (ref 4.0–10.5)

## 2021-08-04 LAB — URINALYSIS, ROUTINE W REFLEX MICROSCOPIC
Bilirubin Urine: NEGATIVE
Hgb urine dipstick: NEGATIVE
Ketones, ur: NEGATIVE
Leukocytes,Ua: NEGATIVE
Nitrite: NEGATIVE
RBC / HPF: NONE SEEN (ref 0–?)
Specific Gravity, Urine: 1.02 (ref 1.000–1.030)
Total Protein, Urine: NEGATIVE
Urine Glucose: NEGATIVE
Urobilinogen, UA: 0.2 (ref 0.0–1.0)
pH: 8 (ref 5.0–8.0)

## 2021-08-04 LAB — VITAMIN D 25 HYDROXY (VIT D DEFICIENCY, FRACTURES): VITD: 45.21 ng/mL (ref 30.00–100.00)

## 2021-08-04 LAB — HEMOGLOBIN A1C: Hgb A1c MFr Bld: 5.9 % (ref 4.6–6.5)

## 2021-08-04 LAB — VITAMIN B12: Vitamin B-12: >1550 pg/mL — ABNORMAL HIGH (ref 211–911)

## 2021-08-04 LAB — TSH: TSH: 1.05 u[IU]/mL (ref 0.35–5.50)

## 2021-08-04 MED ORDER — POLYSACCHARIDE IRON COMPLEX 150 MG PO CAPS: ORAL_CAPSULE | ORAL | 3 refills | Status: DC

## 2021-08-04 MED ORDER — ROSUVASTATIN CALCIUM 10 MG PO TABS: 10.0000 mg | ORAL_TABLET | Freq: Every day | ORAL | 3 refills | Status: DC

## 2021-08-04 MED ORDER — CITALOPRAM HYDROBROMIDE 40 MG PO TABS: 40.0000 mg | ORAL_TABLET | Freq: Every day | ORAL | 3 refills | Status: DC

## 2021-08-04 MED ORDER — METOPROLOL TARTRATE 25 MG PO TABS: ORAL_TABLET | ORAL | 3 refills | Status: DC

## 2021-08-04 MED ORDER — PANTOPRAZOLE SODIUM 40 MG PO TBEC: 40.0000 mg | DELAYED_RELEASE_TABLET | Freq: Every day | ORAL | 3 refills | Status: DC

## 2021-08-04 NOTE — Assessment & Plan Note (Signed)
Improved overall in the past yr but drifting lower again - ok for change niferex to 1 qod/2 qod, and f/u heme as planned

## 2021-08-04 NOTE — Assessment & Plan Note (Signed)
Last vitamin D Lab Results  Component Value Date   VD25OH 25 (L) 07/16/2020   Low, to start oral replacement

## 2021-08-04 NOTE — Patient Instructions (Signed)

## 2021-08-04 NOTE — Progress Notes (Signed)
Patient ID: Katherine [facility], female   DOB: [birth year], 50 y.o.   MRN: [id number]         Chief Complaint:: wellness exam and f/u low vit d, hyperglycemia, iron deficiency, recent covid infection       HPI:  Katherine Tucker is a 50 y.o. female here for wellness exam; declines covid booster, flu shot, pneumovax, o/ w up to date with preventvie referrals and immunizations                        Also s/p covid infection early aug 2022.  Taking 1500 units Vit D  Pt denies chest pain, increased sob or doe, wheezing, orthopnea, PND, increased LE swelling, palpitations, dizziness or syncope.   Pt denies polydipsia, polyuria, or new focal neuro s/s.  No overt bleeding but has ongoing low iron.   Pt denies fever, wt loss, night sweats, loss of appetite, or other constitutional symptoms  No other new complaint   Wt Readings from Last 3 Encounters:  08/04/21 185 lb (83.9 kg)  07/07/21 192 lb 4.8 oz (87.2 kg)  02/13/21 204 lb (92.5 kg)   BP Readings from Last 3 Encounters:  08/04/21 110/60  07/07/21 107/73  02/13/21 96/68   Immunization History  Administered Date(s) Administered   Influenza Split 09/06/2014   Influenza,inj,Quad PF,6+ Mos 11/04/2017, 11/08/2018, 09/23/2020   PFIZER(Purple Top)SARS-COV-2 Vaccination 01/26/2020, 02/20/2020, 09/23/2020   Tdap 03/29/2013   There are no preventive care reminders to display for this patient.     Past Medical History:  Diagnosis Date   Anxiety    Chicken pox    Depression    Elevated prolactin level    elevated in 2009. MRI brain normal   GERD (gastroesophageal reflux disease)    Headache(784.0)    Hiatal hernia    Hyperlipidemia    Hypertension    IDA (iron deficiency anemia)    Mammary duct ectasia of right breast 2009   excision of duct - Dr. Davina Poke 2010 - nonmalignant path   Morbid obesity (HCC)    Sleep apnea with use of continuous positive airway pressure (CPAP)    Past Surgical History:  Procedure Laterality Date   BLADDER  SURGERY     childhood   breast lesion excison Right Jan 2010   Dr. Luisa Hart. Ductal ectasia   lapband procedure  5-11   [facility]   MOUTH SURGERY      reports that she has never smoked. She has never used smokeless tobacco. She reports current alcohol use. She reports that she does not use drugs. family history includes Arthritis in her mother; Breast cancer in her paternal aunt; Diabetes in her father, mother, and paternal grandmother; Hyperlipidemia in her father and mother; Hypertension in her father and mother; Prostate cancer in her father. Allergies  Allergen Reactions   Contrast Media [Iodinated Diagnostic Agents] Nausea And Vomiting    Severe nausea and vomiting   Current Outpatient Medications on File Prior to Visit  Medication Sig Dispense Refill   Calcium 1500 MG tablet Take 1,500 mg by mouth daily.     CVS ASPIRIN LOW DOSE 81 MG EC tablet TAKE 1 TABLET (81 MG TOTAL) BY MOUTH DAILY. SWALLOW WHOLE. 90 tablet 1   fluconazole (DIFLUCAN) 150 MG tablet 1 tab by mouth every 3 days as needed 2 tablet 1   multivitamin (THERAGRAN) tablet Take 1 tablet by mouth daily. Centrum [place].     No current facility-administered medications on file  prior to visit.        ROS:  All others reviewed and negative.  Objective        PE:  BP 110/60 (BP Location: Right Arm, Patient Position: Sitting, Cuff Size: Large)   Pulse 64   Temp 99 F (37.2 C) (Oral)   Ht 5\' 6"  (1.676 m)   Wt 185 lb (83.9 kg)   SpO2 97%   BMI 29.86 kg/m                 Constitutional: Pt appears in NAD               HENT: Head: NCAT.                Right Ear: External ear normal.                 Left Ear: External ear normal.                Eyes: . Pupils are equal, round, and reactive to light. Conjunctivae and EOM are normal               Nose: without d/c or deformity               Neck: Neck supple. Gross normal ROM               Cardiovascular: Normal rate and regular rhythm.                 Pulmonary/Chest:  Effort normal and breath sounds without rales or wheezing.                Abd:  Soft, NT, ND, + BS, no organomegaly               Neurological: Pt is alert. At baseline orientation, motor grossly intact               Skin: Skin is warm. No rashes, no other new lesions, LE edema - none               Psychiatric: Pt behavior is normal without agitation   Micro: none  Cardiac tracings I have personally interpreted today:  none  Pertinent Radiological findings (summarize): none   Lab Results  Component Value Date   WBC 3.7 (L) 08/04/2021   HGB 12.5 08/04/2021   HCT 36.6 08/04/2021   PLT 214.0 08/04/2021   GLUCOSE 85 08/04/2021   CHOL 134 08/04/2021   TRIG 41.0 08/04/2021   HDL 64.60 08/04/2021   LDLCALC 61 08/04/2021   ALT 11 08/04/2021   AST 18 08/04/2021   NA 137 08/04/2021   K 3.7 08/04/2021   CL 100 08/04/2021   CREATININE 1.07 08/04/2021   BUN 13 08/04/2021   CO2 30 08/04/2021   TSH 1.05 08/04/2021   HGBA1C 5.9 08/04/2021   Assessment/Plan:  Katherine Tucker is a 50 y.o. Black or African American [2] female with  has a past medical history of Anxiety, Chicken pox, Depression, Elevated prolactin level, GERD (gastroesophageal reflux disease), Headache(784.0), Hiatal hernia, Hyperlipidemia, Hypertension, IDA (iron deficiency anemia), Mammary duct ectasia of right breast (2009), Morbid obesity (HCC), and Sleep apnea with use of continuous positive airway pressure (CPAP).  Vitamin D deficiency Last vitamin D Lab Results  Component Value Date   VD25OH 25 (L) 07/16/2020   Low, to start oral replacement   Iron deficiency anemia Improved overall in the past yr but drifting lower again - ok  for change niferex to 1 qod/2 qod, and f/u heme as planned  Encounter for well adult exam with abnormal findings Age and sex appropriate education and counseling updated with regular exercise and diet Referrals for preventative services - none needed Immunizations addressed - declines  covid booster, flu shot, pneumovax Smoking counseling  - none needed Evidence for depression or other mood disorder - none significant Most recent labs reviewed. I have personally reviewed and have noted: 1) the patient's medical and social history 2) The patient's current medications and supplements 3) The patient's height, weight, and BMI have been recorded in the chart   Hyperlipidemia Lab Results  Component Value Date   LDLCALC 61 08/04/2021   Stable, pt to continue current statin crestor 10   Hyperglycemia Lab Results  Component Value Date   HGBA1C 5.9 08/04/2021   Stable, pt to continue current medical treatment  - diet  Followup: Return in about 1 year (around 08/04/2022).  Oliver Barre, MD 08/07/2021 9:51 PM [facility]

## 2021-08-07 ENCOUNTER — Encounter: Payer: Self-pay | Admitting: Internal Medicine

## 2021-08-07 NOTE — Assessment & Plan Note (Signed)
Age and sex appropriate education and counseling updated with regular exercise and diet Referrals for preventative services - none needed Immunizations addressed - declines covid booster, flu shot, pneumovax Smoking counseling  - none needed Evidence for depression or other mood disorder - none significant Most recent labs reviewed. I have personally reviewed and have noted: 1) the patient's medical and social history 2) The patient's current medications and supplements 3) The patient's height, weight, and BMI have been recorded in the chart

## 2021-08-07 NOTE — Assessment & Plan Note (Signed)
Lab Results  Component Value Date   HGBA1C 5.9 08/04/2021   Stable, pt to continue current medical treatment  - diet

## 2021-08-07 NOTE — Assessment & Plan Note (Signed)
Lab Results  Component Value Date   LDLCALC 61 08/04/2021   Stable, pt to continue current statin crestor 10

## 2021-08-08 ENCOUNTER — Other Ambulatory Visit: Payer: Self-pay | Admitting: Obstetrics and Gynecology

## 2021-08-17 ENCOUNTER — Other Ambulatory Visit: Payer: Self-pay | Admitting: Internal Medicine

## 2022-01-07 ENCOUNTER — Inpatient Hospital Stay: Payer: BC Managed Care – PPO | Attending: Internal Medicine | Admitting: Internal Medicine

## 2022-01-07 ENCOUNTER — Other Ambulatory Visit: Payer: Self-pay | Admitting: Medical Oncology

## 2022-01-07 ENCOUNTER — Other Ambulatory Visit: Payer: Self-pay

## 2022-01-07 ENCOUNTER — Inpatient Hospital Stay: Payer: BC Managed Care – PPO

## 2022-01-07 VITALS — BP 101/70 | HR 63 | Temp 97.8°F | Resp 20 | Ht 66.0 in | Wt 176.1 lb

## 2022-01-07 DIAGNOSIS — Z7982 Long term (current) use of aspirin: Secondary | ICD-10-CM | POA: Insufficient documentation

## 2022-01-07 DIAGNOSIS — K909 Intestinal malabsorption, unspecified: Secondary | ICD-10-CM | POA: Diagnosis present

## 2022-01-07 DIAGNOSIS — Z79899 Other long term (current) drug therapy: Secondary | ICD-10-CM | POA: Insufficient documentation

## 2022-01-07 DIAGNOSIS — D508 Other iron deficiency anemias: Secondary | ICD-10-CM | POA: Diagnosis not present

## 2022-01-07 DIAGNOSIS — R5383 Other fatigue: Secondary | ICD-10-CM | POA: Diagnosis not present

## 2022-01-07 LAB — CBC WITH DIFFERENTIAL (CANCER CENTER ONLY)
Abs Immature Granulocytes: 0 10*3/uL (ref 0.00–0.07)
Basophils Absolute: 0 10*3/uL (ref 0.0–0.1)
Basophils Relative: 1 %
Eosinophils Absolute: 0.3 10*3/uL (ref 0.0–0.5)
Eosinophils Relative: 6 %
HCT: 38 % (ref 36.0–46.0)
Hemoglobin: 12.3 g/dL (ref 12.0–15.0)
Immature Granulocytes: 0 %
Lymphocytes Relative: 29 %
Lymphs Abs: 1.3 10*3/uL (ref 0.7–4.0)
MCH: 30 pg (ref 26.0–34.0)
MCHC: 32.4 g/dL (ref 30.0–36.0)
MCV: 92.7 fL (ref 80.0–100.0)
Monocytes Absolute: 0.6 10*3/uL (ref 0.1–1.0)
Monocytes Relative: 14 %
Neutro Abs: 2.3 10*3/uL (ref 1.7–7.7)
Neutrophils Relative %: 50 %
Platelet Count: 216 10*3/uL (ref 150–400)
RBC: 4.1 MIL/uL (ref 3.87–5.11)
RDW: 13.3 % (ref 11.5–15.5)
WBC Count: 4.5 10*3/uL (ref 4.0–10.5)
nRBC: 0 % (ref 0.0–0.2)

## 2022-01-07 LAB — IRON AND IRON BINDING CAPACITY (CC-WL,HP ONLY)
Iron: 73 ug/dL (ref 28–170)
Saturation Ratios: 25 % (ref 10.4–31.8)
TIBC: 297 ug/dL (ref 250–450)
UIBC: 224 ug/dL

## 2022-01-07 NOTE — Progress Notes (Signed)
[place] [phone]   [phone]  OFFICE PROGRESS NOTE  [name], MD [place] [address]  DIAGNOSIS: History of microcytic anemia  PRIOR THERAPY: Iron infusion with Venofer weekly x3 doses.  CURRENT THERAPY: Ferrex 150 mg p.o. daily  INTERVAL HISTORY: Katherine Tucker 51 y.o. female returns to the clinic today for follow-up visit.  The patient is feeling fine today with no concerning complaints except for very mild fatigue.  She also lost around 10 pounds since her last visit but this was intentional after her Lap band surgery.  She denied having any current chest pain, shortness breath, cough or hemoptysis.  She denies having any fever or chills.  She has no nausea, vomiting, diarrhea or constipation.  She continues to tolerate her oral iron tablets fairly well.  She has been on 2 tablets daily recently.  She is here today for evaluation and repeat blood work.   MEDICAL HISTORY: Past Medical History:  Diagnosis Date   Anxiety    Chicken pox    Depression    Elevated prolactin level    elevated in 2009. MRI brain normal   GERD (gastroesophageal reflux disease)    Headache(784.0)    Hiatal hernia    Hyperlipidemia    Hypertension    IDA (iron deficiency anemia)    Mammary duct ectasia of right breast 2009   excision of duct - Dr. Malachi Paradise 2010 - nonmalignant path   Morbid obesity ([place])    Sleep apnea with use of continuous positive airway pressure (CPAP)     ALLERGIES:  is allergic to contrast media [iodinated contrast media].  MEDICATIONS:  Current Outpatient Medications  Medication Sig Dispense Refill   Calcium 1500 MG tablet Take 1,500 mg by mouth daily.     citalopram (CELEXA) 40 MG tablet Take 1 tablet (40 mg total) by mouth daily. 90 tablet 3   [facility] ASPIRIN LOW DOSE 81 MG EC tablet TAKE 1 TABLET (81 MG TOTAL) BY MOUTH DAILY. SWALLOW WHOLE. 90 tablet 1   fluconazole (DIFLUCAN) 150 MG tablet 1 tab by  mouth every 3 days as needed 2 tablet 1   iron polysaccharides (NIFEREX) 150 MG capsule TAKE 1 CAPSULE BY MOUTH EVERY DAY 30 capsule 5   metoprolol tartrate (LOPRESSOR) 25 MG tablet TAKE 1 TABLET BY MOUTH TWICE A DAY 180 tablet 3   multivitamin (THERAGRAN) tablet Take 1 tablet by mouth daily. Centrum Chew.     pantoprazole (PROTONIX) 40 MG tablet Take 1 tablet (40 mg total) by mouth daily. 90 tablet 3   rosuvastatin (CRESTOR) 10 MG tablet Take 1 tablet (10 mg total) by mouth daily. 90 tablet 3   No current facility-administered medications for this visit.    SURGICAL HISTORY:  Past Surgical History:  Procedure Laterality Date   BLADDER SURGERY     childhood   breast lesion excison Right Jan 2010   Dr. Brantley Stage. Ductal ectasia   lapband procedure  5-11   [facility]   MOUTH SURGERY      REVIEW OF SYSTEMS:  A comprehensive review of systems was negative except for: Constitutional: positive for fatigue   PHYSICAL EXAMINATION: General appearance: alert, cooperative, and no distress Head: Normocephalic, without obvious abnormality, atraumatic Neck: no adenopathy, no JVD, supple, symmetrical, trachea midline, and thyroid not enlarged, symmetric, no tenderness/mass/nodules Lymph nodes: Cervical, supraclavicular, and axillary nodes normal. Resp: clear to auscultation bilaterally Back: symmetric, no curvature. ROM normal. No  CVA tenderness. Cardio: regular rate and rhythm, S1, S2 normal, no murmur, click, rub or gallop GI: soft, non-tender; bowel sounds normal; no masses,  no organomegaly Extremities: extremities normal, atraumatic, no cyanosis or edema  ECOG PERFORMANCE STATUS: 0 - Asymptomatic  Blood pressure 101/70, pulse 63, temperature 97.8 F (36.6 C), temperature source Tympanic, resp. rate 20, height 5\' 6"  (1.676 m), weight 176 lb 1.6 oz (79.9 kg), SpO2 98 %.  LABORATORY DATA: Lab Results  Component Value Date   WBC 4.5 01/07/2022   HGB 12.3 01/07/2022   HCT 38.0 01/07/2022    MCV 92.7 01/07/2022   PLT 216 01/07/2022      Chemistry      Component Value Date/Time   NA 137 08/04/2021 0929   K 3.7 08/04/2021 0929   CL 100 08/04/2021 0929   CO2 30 08/04/2021 0929   BUN 13 08/04/2021 0929   CREATININE 1.07 08/04/2021 0929   CREATININE 0.91 11/05/2020 1324      Component Value Date/Time   CALCIUM 10.6 (H) 08/04/2021 0929   ALKPHOS 38 (L) 08/04/2021 0929   AST 18 08/04/2021 0929   AST 22 11/05/2020 1324   ALT 11 08/04/2021 0929   ALT 15 11/05/2020 1324   BILITOT 0.4 08/04/2021 0929   BILITOT 0.5 11/05/2020 1324       RADIOGRAPHIC STUDIES: No results found.   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  The patient was treated with iron infusion with Venofer for 3 doses and tolerated it fairly well.  She is also on iron supplement with Ferrex 150 mg p.o. daily. Her Ferrex dose was increased to 2 tablets daily over the last few months. Repeat CBC today showed normal hemoglobin of 12.6 and hematocrit 38.0.  The patient has normal white blood count and platelet count. Iron study and ferritin are still pending. I recommended for the patient to continue her current treatment with Ferrex but only once daily for now unless the iron studies showed any concerning and significant iron deficiency and may increase her dose or consider her for IV iron infusion. The patient will come back for follow-up visit in 6 months for reevaluation and repeat blood work. She was advised to call immediately if she has any other concerning symptoms in the interval. The patient voices understanding of current disease status and treatment options and is in agreement with the current care plan.  All questions were answered. The patient knows to call the clinic with any problems, questions or concerns. We can certainly see the patient much sooner if  necessary.  Disclaimer: This note was dictated with voice recognition software. Similar sounding words can inadvertently be transcribed and may not be corrected upon review.

## 2022-01-08 LAB — FERRITIN: Ferritin: 23 ng/mL (ref 11–307)

## 2022-07-08 ENCOUNTER — Inpatient Hospital Stay: Payer: BC Managed Care – PPO | Attending: Internal Medicine

## 2022-07-08 ENCOUNTER — Inpatient Hospital Stay: Payer: BC Managed Care – PPO | Admitting: Internal Medicine

## 2022-08-05 ENCOUNTER — Other Ambulatory Visit: Payer: Self-pay | Admitting: Internal Medicine

## 2022-08-11 ENCOUNTER — Other Ambulatory Visit: Payer: Self-pay | Admitting: Internal Medicine

## 2022-08-11 NOTE — Telephone Encounter (Signed)
Ok to let pt know -   Ok to hold on iron supplement for now, and we will  plan to recheck at her visit later this month.   As she may not need further  thanks

## 2022-08-11 NOTE — Telephone Encounter (Signed)
Left message regarding refill refusal in detail.

## 2022-08-17 ENCOUNTER — Other Ambulatory Visit: Payer: Self-pay | Admitting: Internal Medicine

## 2022-08-17 NOTE — Telephone Encounter (Signed)
Please refill as per office routine med refill policy (all routine meds to be refilled for 3 mo or monthly (per pt preference) up to one year from last visit, then month to month grace period for 3 mo, then further med refills will have to be denied) ? ?

## 2022-08-19 ENCOUNTER — Other Ambulatory Visit: Payer: Self-pay | Admitting: Internal Medicine

## 2022-08-19 NOTE — Telephone Encounter (Signed)
Please refill as per office routine med refill policy (all routine meds to be refilled for 3 mo or monthly (per pt preference) up to one year from last visit, then month to month grace period for 3 mo, then further med refills will have to be denied) ? ?

## 2022-08-25 ENCOUNTER — Encounter: Payer: Self-pay | Admitting: Internal Medicine

## 2022-08-25 ENCOUNTER — Ambulatory Visit (INDEPENDENT_AMBULATORY_CARE_PROVIDER_SITE_OTHER): Payer: BC Managed Care – PPO | Admitting: Internal Medicine

## 2022-08-25 VITALS — BP 122/68 | HR 76 | Temp 98.8°F | Ht 66.0 in | Wt 177.0 lb

## 2022-08-25 DIAGNOSIS — L989 Disorder of the skin and subcutaneous tissue, unspecified: Secondary | ICD-10-CM | POA: Insufficient documentation

## 2022-08-25 DIAGNOSIS — R739 Hyperglycemia, unspecified: Secondary | ICD-10-CM

## 2022-08-25 DIAGNOSIS — E538 Deficiency of other specified B group vitamins: Secondary | ICD-10-CM | POA: Diagnosis not present

## 2022-08-25 DIAGNOSIS — D508 Other iron deficiency anemias: Secondary | ICD-10-CM

## 2022-08-25 DIAGNOSIS — Z23 Encounter for immunization: Secondary | ICD-10-CM

## 2022-08-25 DIAGNOSIS — E7800 Pure hypercholesterolemia, unspecified: Secondary | ICD-10-CM

## 2022-08-25 DIAGNOSIS — E559 Vitamin D deficiency, unspecified: Secondary | ICD-10-CM | POA: Diagnosis not present

## 2022-08-25 DIAGNOSIS — Z0001 Encounter for general adult medical examination with abnormal findings: Secondary | ICD-10-CM

## 2022-08-25 DIAGNOSIS — I10 Essential (primary) hypertension: Secondary | ICD-10-CM

## 2022-08-25 LAB — URINALYSIS, ROUTINE W REFLEX MICROSCOPIC
Bilirubin Urine: NEGATIVE
Ketones, ur: NEGATIVE
Leukocytes,Ua: NEGATIVE
Nitrite: NEGATIVE
Specific Gravity, Urine: 1.02 (ref 1.000–1.030)
Total Protein, Urine: NEGATIVE
Urine Glucose: NEGATIVE
Urobilinogen, UA: 0.2 (ref 0.0–1.0)
pH: 6.5 (ref 5.0–8.0)

## 2022-08-25 LAB — CBC WITH DIFFERENTIAL/PLATELET
Basophils Absolute: 0 10*3/uL (ref 0.0–0.1)
Basophils Relative: 0.5 % (ref 0.0–3.0)
Eosinophils Absolute: 0.1 10*3/uL (ref 0.0–0.7)
Eosinophils Relative: 2.5 % (ref 0.0–5.0)
HCT: 39.3 % (ref 36.0–46.0)
Hemoglobin: 13.1 g/dL (ref 12.0–15.0)
Lymphocytes Relative: 29.7 % (ref 12.0–46.0)
Lymphs Abs: 1.4 10*3/uL (ref 0.7–4.0)
MCHC: 33.2 g/dL (ref 30.0–36.0)
MCV: 91.7 fl (ref 78.0–100.0)
Monocytes Absolute: 0.5 10*3/uL (ref 0.1–1.0)
Monocytes Relative: 11 % (ref 3.0–12.0)
Neutro Abs: 2.6 10*3/uL (ref 1.4–7.7)
Neutrophils Relative %: 56.3 % (ref 43.0–77.0)
Platelets: 195 10*3/uL (ref 150.0–400.0)
RBC: 4.29 Mil/uL (ref 3.87–5.11)
RDW: 14.1 % (ref 11.5–15.5)
WBC: 4.7 10*3/uL (ref 4.0–10.5)

## 2022-08-25 LAB — LIPID PANEL
Cholesterol: 148 mg/dL (ref 0–200)
HDL: 77.5 mg/dL (ref >39.00)
LDL Cholesterol: 61 mg/dL (ref 0–99)
NonHDL: 70.17
Total CHOL/HDL Ratio: 2
Triglycerides: 48 mg/dL (ref 0.0–149.0)
VLDL: 9.6 mg/dL (ref 0.0–40.0)

## 2022-08-25 LAB — HEPATIC FUNCTION PANEL
ALT: 13 U/L (ref 0–35)
AST: 22 U/L (ref 0–37)
Albumin: 4 g/dL (ref 3.5–5.2)
Alkaline Phosphatase: 40 U/L (ref 39–117)
Bilirubin, Direct: 0.1 mg/dL (ref 0.0–0.3)
Total Bilirubin: 0.4 mg/dL (ref 0.2–1.2)
Total Protein: 7.2 g/dL (ref 6.0–8.3)

## 2022-08-25 LAB — VITAMIN B12: Vitamin B-12: >1500 pg/mL — ABNORMAL HIGH (ref 211–911)

## 2022-08-25 LAB — IBC PANEL
Iron: 135 ug/dL (ref 42–145)
Saturation Ratios: 42.9 % (ref 20.0–50.0)
TIBC: 315 ug/dL (ref 250.0–450.0)
Transferrin: 225 mg/dL (ref 212.0–360.0)

## 2022-08-25 LAB — BASIC METABOLIC PANEL
BUN: 12 mg/dL (ref 6–23)
CO2: 31 mEq/L (ref 19–32)
Calcium: 10.5 mg/dL (ref 8.4–10.5)
Chloride: 99 mEq/L (ref 96–112)
Creatinine, Ser: 0.98 mg/dL (ref 0.40–1.20)
GFR: 68.06 mL/min (ref >60.00)
Glucose, Bld: 87 mg/dL (ref 70–99)
Potassium: 4 mEq/L (ref 3.5–5.1)
Sodium: 134 mEq/L — ABNORMAL LOW (ref 135–145)

## 2022-08-25 LAB — TSH: TSH: 1.07 u[IU]/mL (ref 0.35–5.50)

## 2022-08-25 LAB — FERRITIN: Ferritin: 8.9 ng/mL — ABNORMAL LOW (ref 10.0–291.0)

## 2022-08-25 LAB — VITAMIN D 25 HYDROXY (VIT D DEFICIENCY, FRACTURES): VITD: 37.83 ng/mL (ref 30.00–100.00)

## 2022-08-25 LAB — HEMOGLOBIN A1C: Hgb A1c MFr Bld: 5.7 % (ref 4.6–6.5)

## 2022-08-25 NOTE — Assessment & Plan Note (Signed)
Lab Results  Component Value Date   LDLCALC 61 08/25/2022   Stable, pt to continue current statin crestor 10 qd

## 2022-08-25 NOTE — Assessment & Plan Note (Signed)
Last vitamin D Lab Results  Component Value Date   VD25OH 37.83 08/25/2022   Low, to start oral replacement

## 2022-08-25 NOTE — Assessment & Plan Note (Signed)
Good compliance with bid iron suppleement, also for iron lab

## 2022-08-25 NOTE — Assessment & Plan Note (Signed)

## 2022-08-25 NOTE — Patient Instructions (Signed)
You had the flu shot today  Please continue all other medications as before, and refills have been done if requested.  Please have the pharmacy call with any other refills you may need.  Please continue your efforts at being more active, low cholesterol diet, and weight control.  You are otherwise up to date with prevention measures today.  Please keep your appointments with your specialists as you may have planned  Please go to the LAB at the blood drawing area for the tests to be done  You will be contacted by phone if any changes need to be made immediately.  Otherwise, you will receive a letter about your results with an explanation, but please check with MyChart first.  Please remember to sign up for MyChart if you have not done so, as this will be important to you in the future with finding out test results, communicating by private email, and scheduling acute appointments online when needed.  Please make an Appointment to return for your 1 year visit, or sooner if needed 

## 2022-08-25 NOTE — Assessment & Plan Note (Signed)
C/w benign very small subq nodule lesion, low suspicion for malignancy, ok to follow

## 2022-08-25 NOTE — Assessment & Plan Note (Signed)
Lab Results  Component Value Date   HGBA1C 5.7 08/25/2022   Stable, pt to continue current medical treatment  - diet, wt control, excercise

## 2022-08-25 NOTE — Assessment & Plan Note (Signed)
BP Readings from Last 3 Encounters:  08/25/22 122/68  01/07/22 101/70  08/04/21 110/60   Stable, pt to continue medical treatment lopressor 25 bid

## 2022-08-25 NOTE — Assessment & Plan Note (Signed)
Possibly related to supplement use, but check PTH level

## 2022-08-25 NOTE — Progress Notes (Signed)
Patient ID: Katherine Tucker, female   DOB: 06/23/1971, 51 y.o.   MRN: [id number]         Chief Complaint:: wellness exam and skin nodule, hypercalcemia, htn, hyperglycemia       HPI:  Katherine Tucker is a 51 y.o. female here for wellness exam; for flu shot, declines covid booster, o/w up to date                        Also Pt denies chest pain, increased sob or doe, wheezing, orthopnea, PND, increased LE swelling, palpitations, dizziness or syncope.   Pt denies polydipsia, polyuria, or new focal neuro s/s.    Pt denies fever, wt loss, night sweats, loss of appetite, or other constitutional symptoms  Has 2 recent very mild elevated calcium.  Does also have a very small bead like subq nodule to left wrist area.     Wt Readings from Last 3 Encounters:  08/25/22 177 lb (80.3 kg)  01/07/22 176 lb 1.6 oz (79.9 kg)  08/04/21 185 lb (83.9 kg)   BP Readings from Last 3 Encounters:  08/25/22 122/68  01/07/22 101/70  08/04/21 110/60   Immunization History  Administered Date(s) Administered   Influenza Split 09/06/2014   Influenza,inj,Quad PF,6+ Mos 11/04/2017, 11/08/2018, 09/23/2020, 08/25/2022   PFIZER(Purple Top)SARS-COV-2 Vaccination 01/26/2020, 02/20/2020, 09/23/2020   Tdap 03/29/2013  There are no preventive care reminders to display for this patient.    Past Medical History:  Diagnosis Date   Anxiety    Chicken pox    Depression    Elevated prolactin level    elevated in 2009. MRI brain normal   GERD (gastroesophageal reflux disease)    Headache(784.0)    Hiatal hernia    Hyperlipidemia    Hypertension    IDA (iron deficiency anemia)    Mammary duct ectasia of right breast 2009   excision of duct - Dr. Malachi Paradise 2010 - nonmalignant path   Morbid obesity ([place])    Sleep apnea with use of continuous positive airway pressure (CPAP)    Past Surgical History:  Procedure Laterality Date   BLADDER SURGERY     childhood   breast lesion excison Right Jan 2010   Dr. Brantley Stage. Ductal  ectasia   lapband procedure  5-11   [facility]   MOUTH SURGERY      reports that she has never smoked. She has never used smokeless tobacco. She reports current alcohol use. She reports that she does not use drugs. family history includes Arthritis in her mother; Breast cancer in her paternal aunt; Diabetes in her father, mother, and paternal grandmother; Hyperlipidemia in her father and mother; Hypertension in her father and mother; Prostate cancer in her father. Allergies  Allergen Reactions   Contrast Media [Iodinated Contrast Media] Nausea And Vomiting    Severe nausea and vomiting   Current Outpatient Medications on File Prior to Visit  Medication Sig Dispense Refill   Calcium 1500 MG tablet Take 1,500 mg by mouth daily.     citalopram (CELEXA) 40 MG tablet TAKE 1 TABLET BY MOUTH EVERY DAY 30 tablet 0   [facility] ASPIRIN LOW DOSE 81 MG EC tablet TAKE 1 TABLET (81 MG TOTAL) BY MOUTH DAILY. SWALLOW WHOLE. 90 tablet 1   fluconazole (DIFLUCAN) 150 MG tablet 1 tab by mouth every 3 days as needed 2 tablet 1   iron polysaccharides (NIFEREX) 150 MG capsule TAKE 1 CAPSULE BY MOUTH EVERY DAY 30 capsule 5  metoprolol tartrate (LOPRESSOR) 25 MG tablet TAKE 1 TABLET BY MOUTH TWICE A DAY/ Patient needs appointment for further refills. 30 tablet 0   multivitamin (THERAGRAN) tablet Take 1 tablet by mouth daily. Centrum Chew.     pantoprazole (PROTONIX) 40 MG tablet TAKE 1 TABLET BY MOUTH EVERY DAY 30 tablet 0   rosuvastatin (CRESTOR) 10 MG tablet TAKE 1 TABLET BY MOUTH EVERY DAY 30 tablet 0   No current facility-administered medications on file prior to visit.        ROS:  All others reviewed and negative.  Objective        PE:  BP 122/68 (BP Location: Right Arm, Patient Position: Sitting, Cuff Size: Large)   Pulse 76   Temp 98.8 F (37.1 C) (Oral)   Ht 5\' 6"  (1.676 m)   Wt 177 lb (80.3 kg)   SpO2 95%   BMI 28.57 kg/m                 Constitutional: Pt appears in NAD               HENT: Head:  NCAT.                Right Ear: External ear normal.                 Left Ear: External ear normal.                Eyes: . Pupils are equal, round, and reactive to light. Conjunctivae and EOM are normal               Nose: without d/c or deformity               Neck: Neck supple. Gross normal ROM               Cardiovascular: Normal rate and regular rhythm.                 Pulmonary/Chest: Effort normal and breath sounds without rales or wheezing.                Abd:  Soft, NT, ND, + BS, no organomegaly               Neurological: Pt is alert. At baseline orientation, motor grossly intact               Skin: Skin is warm. Left wrist sub q skin nodule 5 mm, firm, mobile, regular LE edema - none               Psychiatric: Pt behavior is normal without agitation   Micro: none  Cardiac tracings I have personally interpreted today:  none  Pertinent Radiological findings (summarize): none   Lab Results  Component Value Date   WBC 4.7 08/25/2022   HGB 13.1 08/25/2022   HCT 39.3 08/25/2022   PLT 195.0 08/25/2022   GLUCOSE 87 08/25/2022   CHOL 148 08/25/2022   TRIG 48.0 08/25/2022   HDL 77.50 08/25/2022   LDLCALC 61 08/25/2022   ALT 13 08/25/2022   AST 22 08/25/2022   NA 134 (L) 08/25/2022   K 4.0 08/25/2022   CL 99 08/25/2022   CREATININE 0.98 08/25/2022   BUN 12 08/25/2022   CO2 31 08/25/2022   TSH 1.07 08/25/2022   HGBA1C 5.7 08/25/2022   Assessment/Plan:  Katherine Tucker is a 51 y.o. Black or African American [2] female with  has a past medical history  of Anxiety, Chicken pox, Depression, Elevated prolactin level, GERD (gastroesophageal reflux disease), Headache(784.0), Hiatal hernia, Hyperlipidemia, Hypertension, IDA (iron deficiency anemia), Mammary duct ectasia of right breast (2009), Morbid obesity ([place]), and Sleep apnea with use of continuous positive airway pressure (CPAP).  Hypercalcemia Possibly related to supplement use, but check PTH level  Encounter for well  adult exam with abnormal findings Age and sex appropriate education and counseling updated with regular exercise and diet Referrals for preventative services - none needed Immunizations addressed - declines covid booster Smoking counseling  - none needed Evidence for depression or other mood disorder - none significant Most recent labs reviewed. I have personally reviewed and have noted: 1) the patient's medical and social history 2) The patient's current medications and supplements 3) The patient's height, weight, and BMI have been recorded in the chart   Essential hypertension BP Readings from Last 3 Encounters:  08/25/22 122/68  01/07/22 101/70  08/04/21 110/60   Stable, pt to continue medical treatment lopressor 25 bid   Hyperglycemia Lab Results  Component Value Date   HGBA1C 5.7 08/25/2022   Stable, pt to continue current medical treatment  - diet, wt control, excercise   Hyperlipidemia Lab Results  Component Value Date   LDLCALC 61 08/25/2022   Stable, pt to continue current statin crestor 10 qd   Iron deficiency anemia Good compliance with bid iron suppleement, also for iron lab  Vitamin D deficiency Last vitamin D Lab Results  Component Value Date   VD25OH 37.83 08/25/2022   Low, to start oral replacement   Skin lesion of left arm C/w benign very small subq nodule lesion, low suspicion for malignancy, ok to follow Followup: Return in about 1 year (around 08/26/2023).  Cathlean Cower, MD 08/25/2022 9:31 PM [facility]

## 2022-08-28 LAB — PTH, INTACT AND CALCIUM
Calcium: 10.8 mg/dL — ABNORMAL HIGH (ref 8.6–10.2)
PTH: 21 pg/mL (ref 16–77)

## 2022-08-29 ENCOUNTER — Other Ambulatory Visit: Payer: Self-pay | Admitting: Internal Medicine

## 2022-09-03 ENCOUNTER — Other Ambulatory Visit: Payer: Self-pay | Admitting: Internal Medicine

## 2022-09-13 ENCOUNTER — Other Ambulatory Visit: Payer: Self-pay | Admitting: Internal Medicine

## 2022-09-14 NOTE — Telephone Encounter (Signed)
Please refill as per office routine med refill policy (all routine meds to be refilled for 3 mo or monthly (per pt preference) up to one year from last visit, then month to month grace period for 3 mo, then further med refills will have to be denied) ? ?

## 2022-11-19 ENCOUNTER — Other Ambulatory Visit: Payer: Self-pay | Admitting: Internal Medicine

## 2023-09-06 ENCOUNTER — Ambulatory Visit: Payer: BC Managed Care – PPO | Admitting: Internal Medicine

## 2023-09-06 ENCOUNTER — Encounter: Payer: Self-pay | Admitting: Internal Medicine

## 2023-09-06 VITALS — BP 120/72 | HR 70 | Temp 99.1°F | Ht 66.0 in | Wt 190.0 lb

## 2023-09-06 DIAGNOSIS — R739 Hyperglycemia, unspecified: Secondary | ICD-10-CM

## 2023-09-06 DIAGNOSIS — Z23 Encounter for immunization: Secondary | ICD-10-CM

## 2023-09-06 DIAGNOSIS — Z0001 Encounter for general adult medical examination with abnormal findings: Secondary | ICD-10-CM | POA: Diagnosis not present

## 2023-09-06 DIAGNOSIS — E559 Vitamin D deficiency, unspecified: Secondary | ICD-10-CM | POA: Diagnosis not present

## 2023-09-06 DIAGNOSIS — E538 Deficiency of other specified B group vitamins: Secondary | ICD-10-CM | POA: Diagnosis not present

## 2023-09-06 DIAGNOSIS — I10 Essential (primary) hypertension: Secondary | ICD-10-CM

## 2023-09-06 DIAGNOSIS — E7800 Pure hypercholesterolemia, unspecified: Secondary | ICD-10-CM

## 2023-09-06 DIAGNOSIS — D508 Other iron deficiency anemias: Secondary | ICD-10-CM

## 2023-09-06 LAB — URINALYSIS, ROUTINE W REFLEX MICROSCOPIC
Bilirubin Urine: NEGATIVE
Ketones, ur: NEGATIVE
Leukocytes,Ua: NEGATIVE
Nitrite: NEGATIVE
Specific Gravity, Urine: 1.02 (ref 1.000–1.030)
Total Protein, Urine: NEGATIVE
Urine Glucose: NEGATIVE
Urobilinogen, UA: 0.2 (ref 0.0–1.0)
pH: 6.5 (ref 5.0–8.0)

## 2023-09-06 LAB — MICROALBUMIN / CREATININE URINE RATIO
Creatinine,U: 183.2 mg/dL
Microalb Creat Ratio: 0.4 mg/g (ref 0.0–30.0)
Microalb, Ur: <0.7 mg/dL (ref 0.0–1.9)

## 2023-09-06 LAB — CBC WITH DIFFERENTIAL/PLATELET
Basophils Absolute: 0 10*3/uL (ref 0.0–0.1)
Basophils Relative: 0.9 % (ref 0.0–3.0)
Eosinophils Absolute: 0.1 10*3/uL (ref 0.0–0.7)
Eosinophils Relative: 4.4 % (ref 0.0–5.0)
HCT: 38.2 % (ref 36.0–46.0)
Hemoglobin: 12.4 g/dL (ref 12.0–15.0)
Lymphocytes Relative: 32.5 % (ref 12.0–46.0)
Lymphs Abs: 1.1 10*3/uL (ref 0.7–4.0)
MCHC: 32.6 g/dL (ref 30.0–36.0)
MCV: 93.2 fL (ref 78.0–100.0)
Monocytes Absolute: 0.6 10*3/uL (ref 0.1–1.0)
Monocytes Relative: 18.1 % — ABNORMAL HIGH (ref 3.0–12.0)
Neutro Abs: 1.4 10*3/uL (ref 1.4–7.7)
Neutrophils Relative %: 44.1 % (ref 43.0–77.0)
Platelets: 199 10*3/uL (ref 150.0–400.0)
RBC: 4.1 Mil/uL (ref 3.87–5.11)
RDW: 13.8 % (ref 11.5–15.5)
WBC: 3.3 10*3/uL — ABNORMAL LOW (ref 4.0–10.5)

## 2023-09-06 LAB — HEMOGLOBIN A1C: Hgb A1c MFr Bld: 5.4 % (ref 4.6–6.5)

## 2023-09-06 LAB — BASIC METABOLIC PANEL
BUN: 17 mg/dL (ref 6–23)
CO2: 29 meq/L (ref 19–32)
Calcium: 10.2 mg/dL (ref 8.4–10.5)
Chloride: 103 meq/L (ref 96–112)
Creatinine, Ser: 1.07 mg/dL (ref 0.40–1.20)
GFR: 60.81 mL/min (ref >60.00)
Glucose, Bld: 84 mg/dL (ref 70–99)
Potassium: 4.3 meq/L (ref 3.5–5.1)
Sodium: 138 meq/L (ref 135–145)

## 2023-09-06 LAB — VITAMIN B12: Vitamin B-12: 995 pg/mL — ABNORMAL HIGH (ref 211–911)

## 2023-09-06 LAB — VITAMIN D 25 HYDROXY (VIT D DEFICIENCY, FRACTURES): VITD: 35.72 ng/mL (ref 30.00–100.00)

## 2023-09-06 LAB — IBC PANEL
Iron: 56 ug/dL (ref 42–145)
Saturation Ratios: 15.6 % — ABNORMAL LOW (ref 20.0–50.0)
TIBC: 358.4 ug/dL (ref 250.0–450.0)
Transferrin: 256 mg/dL (ref 212.0–360.0)

## 2023-09-06 LAB — HEPATIC FUNCTION PANEL
ALT: 13 U/L (ref 0–35)
AST: 26 U/L (ref 0–37)
Albumin: 3.8 g/dL (ref 3.5–5.2)
Alkaline Phosphatase: 34 U/L — ABNORMAL LOW (ref 39–117)
Bilirubin, Direct: 0.1 mg/dL (ref 0.0–0.3)
Total Bilirubin: 0.4 mg/dL (ref 0.2–1.2)
Total Protein: 6.7 g/dL (ref 6.0–8.3)

## 2023-09-06 LAB — LIPID PANEL
Cholesterol: 187 mg/dL (ref 0–200)
HDL: 85.2 mg/dL (ref >39.00)
LDL Cholesterol: 95 mg/dL (ref 0–99)
NonHDL: 102.02
Total CHOL/HDL Ratio: 2
Triglycerides: 37 mg/dL (ref 0.0–149.0)
VLDL: 7.4 mg/dL (ref 0.0–40.0)

## 2023-09-06 LAB — TSH: TSH: 1.09 u[IU]/mL (ref 0.35–5.50)

## 2023-09-06 LAB — FERRITIN: Ferritin: 5 ng/mL — ABNORMAL LOW (ref 10.0–291.0)

## 2023-09-06 MED ORDER — CITALOPRAM HYDROBROMIDE 40 MG PO TABS: 40.0000 mg | ORAL_TABLET | Freq: Every day | ORAL | 3 refills | Status: DC

## 2023-09-06 MED ORDER — POLYSACCHARIDE IRON COMPLEX 150 MG PO CAPS: ORAL_CAPSULE | ORAL | 3 refills | Status: DC

## 2023-09-06 MED ORDER — ROSUVASTATIN CALCIUM 10 MG PO TABS: 10.0000 mg | ORAL_TABLET | Freq: Every day | ORAL | 3 refills | Status: DC

## 2023-09-06 MED ORDER — METOPROLOL TARTRATE 25 MG PO TABS: ORAL_TABLET | ORAL | 3 refills | Status: DC

## 2023-09-06 MED ORDER — PANTOPRAZOLE SODIUM 40 MG PO TBEC: 40.0000 mg | DELAYED_RELEASE_TABLET | Freq: Every day | ORAL | 3 refills | Status: DC

## 2023-09-06 NOTE — Assessment & Plan Note (Signed)
Overall stable, for f/u iron lab

## 2023-09-06 NOTE — Assessment & Plan Note (Signed)
Lab Results  Component Value Date   HGBA1C 5.7 08/25/2022   Stable, pt to continue current medical treatment  - diet, wt control

## 2023-09-06 NOTE — Patient Instructions (Addendum)
Please continue all other medications as before, and refills have been done if requested.  Please have the pharmacy call with any other refills you may need.  Please continue your efforts at being more active, low cholesterol diet, and weight control.  You are otherwise up to date with prevention measures today.  Please keep your appointments with your specialists as you may have planned  Please go to the LAB at the blood drawing area for the tests to be done  You will be contacted by phone if any changes need to be made immediately.  Otherwise, you will receive a letter about your results with an explanation, but please check with MyChart first.  Please make an Appointment to return for your 1 year visit, or sooner if needed 

## 2023-09-06 NOTE — Assessment & Plan Note (Signed)
Age and sex appropriate education and counseling updated with regular exercise and diet Referrals for preventative services - none needed Immunizations addressed - declines covid booster, for tdap at pharmacy Smoking counseling  - none needed Evidence for depression or other mood disorder - none significant Most recent labs reviewed. I have personally reviewed and have noted: 1) the patient's medical and social history 2) The patient's current medications and supplements 3) The patient's height, weight, and BMI have been recorded in the chart  

## 2023-09-06 NOTE — Assessment & Plan Note (Signed)
Last vitamin D Lab Results  Component Value Date   VD25OH 37.83 08/25/2022   Low, to start oral replacement

## 2023-09-06 NOTE — Assessment & Plan Note (Signed)
Lab Results  Component Value Date   LDLCALC 61 08/25/2022   Stable, pt to continue current statin crestor 10 qd

## 2023-09-06 NOTE — Progress Notes (Signed)
Patient ID: Katherine Tucker, female   DOB: 1971/04/17, 52 y.o.   MRN: [id number]         Chief Complaint:: wellness exam and fatigue lower stamina, htn, hld, hyperglycemia, iron def anemia, low vit d       HPI:  Katherine Tucker is a 52 y.o. female here for wellness exam; declines covid booster, for tdap at pharmacy, plans to see GYN soon, o/w up to date                        Also Pt denies chest pain, increased sob or doe, wheezing, orthopnea, PND, increased LE swelling, palpitations, dizziness or syncope.   Pt denies polydipsia, polyuria, or new focal neuro s/s.    Pt denies fever, wt loss, night sweats, loss of appetite, or other constitutional symptoms  Does c/o ongoing fatigue, but denies signficant daytime hypersomnolence.  No recent overt bleeding or bruising  Sees provider yearly for lap band follow up.  Has gained several lbs recently.  Still deals with high stress job   Wt Readings from Last 3 Encounters:  09/06/23 190 lb (86.2 kg)  08/25/22 177 lb (80.3 kg)  01/07/22 176 lb 1.6 oz (79.9 kg)   BP Readings from Last 3 Encounters:  09/06/23 120/72  08/25/22 122/68  01/07/22 101/70   Immunization History  Administered Date(s) Administered   Influenza Split 09/06/2014   Influenza, Seasonal, Injecte, Preservative Fre 09/06/2023   Influenza,inj,Quad PF,6+ Mos 11/04/2017, 11/08/2018, 09/23/2020, 08/25/2022   PFIZER(Purple Top)SARS-COV-2 Vaccination 01/26/2020, 02/20/2020, 09/23/2020   Tdap 03/29/2013   Health Maintenance Due  Topic Date Due   Cervical Cancer Screening (HPV/Pap Cotest)  03/13/2016   DTaP/Tdap/Td (2 - Td or Tdap) 03/30/2023   COVID-19 Vaccine (4 - 2023-24 season) 08/08/2023      Past Medical History:  Diagnosis Date   Anxiety    Chicken pox    Depression    Elevated prolactin level    elevated in 2009. MRI brain normal   GERD (gastroesophageal reflux disease)    Headache(784.0)    Hiatal hernia    Hyperlipidemia    Hypertension    IDA (iron  deficiency anemia)    Mammary duct ectasia of right breast 2009   excision of duct - Dr. Davina Poke 2010 - nonmalignant path   Morbid obesity (HCC)    Sleep apnea with use of continuous positive airway pressure (CPAP)    Past Surgical History:  Procedure Laterality Date   BLADDER SURGERY     childhood   breast lesion excison Right Jan 2010   Dr. Luisa Hart. Ductal ectasia   lapband procedure  5-11   [facility]   MOUTH SURGERY      reports that she has never smoked. She has never used smokeless tobacco. She reports current alcohol use. She reports that she does not use drugs. family history includes Arthritis in her mother; Breast cancer in her paternal aunt; Diabetes in her father, mother, and paternal grandmother; Hyperlipidemia in her father and mother; Hypertension in her father and mother; Prostate cancer in her father. Allergies  Allergen Reactions   Contrast Media [Iodinated Contrast Media] Nausea And Vomiting    Severe nausea and vomiting   Current Outpatient Medications on File Prior to Visit  Medication Sig Dispense Refill   Calcium 1500 MG tablet Take 1,500 mg by mouth daily.     [facility] ASPIRIN LOW DOSE 81 MG EC tablet TAKE 1 TABLET (81 MG TOTAL) BY MOUTH  DAILY. SWALLOW WHOLE. 90 tablet 1   fluconazole (DIFLUCAN) 150 MG tablet 1 tab by mouth every 3 days as needed 2 tablet 1   multivitamin (THERAGRAN) tablet Take 1 tablet by mouth daily. Centrum [place].     No current facility-administered medications on file prior to visit.        ROS:  All others reviewed and negative.  Objective        PE:  BP 120/72 (BP Location: Right Arm, Patient Position: Sitting, Cuff Size: Normal)   Pulse 70   Temp 99.1 F (37.3 C) (Oral)   Ht 5\' 6"  (1.676 m)   Wt 190 lb (86.2 kg)   LMP 09/04/2023 (Exact Date)   SpO2 99%   BMI 30.67 kg/m                 Constitutional: Pt appears in NAD               HENT: Head: NCAT.                Right Ear: External ear normal.                 Left Ear:  External ear normal.                Eyes: . Pupils are equal, round, and reactive to light. Conjunctivae and EOM are normal               Nose: without d/c or deformity               Neck: Neck supple. Gross normal ROM               Cardiovascular: Normal rate and regular rhythm.                 Pulmonary/Chest: Effort normal and breath sounds without rales or wheezing.                Abd:  Soft, NT, ND, + BS, no organomegaly               Neurological: Pt is alert. At baseline orientation, motor grossly intact               Skin: Skin is warm. No rashes, no other new lesions, LE edema - none               Psychiatric: Pt behavior is normal without agitation   Micro: none  Cardiac tracings I have personally interpreted today:  none  Pertinent Radiological findings (summarize): none   Lab Results  Component Value Date   WBC 4.7 08/25/2022   HGB 13.1 08/25/2022   HCT 39.3 08/25/2022   PLT 195.0 08/25/2022   GLUCOSE 87 08/25/2022   CHOL 148 08/25/2022   TRIG 48.0 08/25/2022   HDL 77.50 08/25/2022   LDLCALC 61 08/25/2022   ALT 13 08/25/2022   AST 22 08/25/2022   NA 134 (L) 08/25/2022   K 4.0 08/25/2022   CL 99 08/25/2022   CREATININE 0.98 08/25/2022   BUN 12 08/25/2022   CO2 31 08/25/2022   TSH 1.07 08/25/2022   HGBA1C 5.7 08/25/2022   Assessment/Plan:  Katherine Tucker is a 52 y.o. Black or African American [2] female with  has a past medical history of Anxiety, Chicken pox, Depression, Elevated prolactin level, GERD (gastroesophageal reflux disease), Headache(784.0), Hiatal hernia, Hyperlipidemia, Hypertension, IDA (iron deficiency anemia), Mammary duct ectasia of right breast (2009), Morbid obesity (HCC),  and Sleep apnea with use of continuous positive airway pressure (CPAP).  Encounter for well adult exam with abnormal findings .Age and sex appropriate education and counseling updated with regular exercise and diet Referrals for preventative services - none  needed Immunizations addressed - declines covid booster, for tdap at pharmacy Smoking counseling  - none needed Evidence for depression or other mood disorder - none significant Most recent labs reviewed. I have personally reviewed and have noted: 1) the patient's medical and social history 2) The patient's current medications and supplements 3) The patient's height, weight, and BMI have been recorded in the chart   Essential hypertension BP Readings from Last 3 Encounters:  09/06/23 120/72  08/25/22 122/68  01/07/22 101/70   Stable, pt to continue medical treatment lopressor 25 bid   Hyperglycemia Lab Results  Component Value Date   HGBA1C 5.7 08/25/2022   Stable, pt to continue current medical treatment  - diet, wt control   Hyperlipidemia Lab Results  Component Value Date   LDLCALC 61 08/25/2022   Stable, pt to continue current statin crestor 10 qd   Iron deficiency anemia Overall stable, for f/u iron lab  Vitamin D deficiency Last vitamin D Lab Results  Component Value Date   VD25OH 37.83 08/25/2022   Low, to start oral replacement  Followup: Return in about 1 year (around 09/05/2024).  Oliver Barre, MD 09/06/2023 12:23 PM [facility]

## 2023-09-06 NOTE — Assessment & Plan Note (Signed)
BP Readings from Last 3 Encounters:  09/06/23 120/72  08/25/22 122/68  01/07/22 101/70   Stable, pt to continue medical treatment lopressor 25 bid

## 2023-09-22 NOTE — Progress Notes (Unsigned)
[name] [address] [place] [address] Phone: [phone] Subjective:   [name], am serving as a scribe for Dr. Antoine Primas.  I'm seeing this patient by the request  of:  [name], MD  CC: Left knee pain follow-up  BJY:[id number]  02/13/2021 Elbow anatomy was reviewed, and tendinopathy was explained.   Pt. given a home rehab program. Start with isometrics and ROM, then a series of concentric and eccentric exercises should be done starting with no weight, work up to 1 lb, hammer, etc.   Use counterforce strap if working or using hands.   Formal PT would be beneficial. Emphasized stretching an cross-friction massage Emphasized proper palms up lifting biomechanics to unload ECRB Patient given exercises today.  Patient will hold on any type of physical therapy at this point.  Will consider in the future.  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  Patient though is able to workout fairly regularly but does need to ice.  We discussed different treatment options including formal physical therapy, injections or even MRI.  Patient declined all of these and would like to continue with conservative therapy and will follow up again in 2 weeks.     Updated 09/23/2023 ARNIE MAIOLO is a 52 y.o. 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  Started with conservative therapy.  Patient states that she has been using collagen peptides which have been helpful. Stairs are painful and she has also done a lot of workouts recently.       Past Medical History:  Diagnosis Date   Anxiety    Chicken pox    Depression    Elevated prolactin level    elevated in 2009. MRI brain normal   GERD (gastroesophageal reflux disease)    Headache(784.0)    Hiatal hernia    Hyperlipidemia    Hypertension    IDA (iron deficiency anemia)    Mammary duct ectasia of right  breast 2009   excision of duct - Dr. Davina Poke 2010 - nonmalignant path   Morbid obesity (HCC)    Sleep apnea with use of continuous positive airway pressure (CPAP)    Past Surgical History:  Procedure Laterality Date   BLADDER SURGERY     childhood   breast lesion excison Right Jan 2010   Dr. Luisa Hart. 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 Remains active and in touch with her sorority sisters. Her  mother lives in [place] and they are close.          Social Determinants of Health   Financial Resource Strain: Not on file  Food Insecurity: Not on file  Transportation Needs: Not on file  Physical Activity: Not on file  Stress: Not on file  Social Connections: Not on file   Allergies  Allergen Reactions   Contrast Media [Iodinated Contrast Media] Nausea And Vomiting    Severe nausea and vomiting   Family History  Problem Relation Age of Onset   Arthritis Mother    Hypertension Mother    Hyperlipidemia Mother    Diabetes Mother    Hypertension Father    Hyperlipidemia Father    Prostate cancer Father    Diabetes Father    Breast cancer Paternal Aunt    Diabetes Paternal  Grandmother    Colon cancer Neg Hx    Esophageal cancer Neg Hx    Rectal cancer Neg Hx    Stomach cancer Neg Hx      Current Outpatient Medications (Cardiovascular):    metoprolol tartrate (LOPRESSOR) 25 MG tablet, TAKE 1 TABLET BY MOUTH TWICE A DAY   rosuvastatin (CRESTOR) 10 MG tablet, Take 1 tablet (10 mg total) by mouth daily.   Current Outpatient Medications (Analgesics):    CVS ASPIRIN LOW DOSE 81 MG EC tablet, TAKE 1 TABLET (81 MG TOTAL) BY MOUTH DAILY. SWALLOW WHOLE.  Current Outpatient Medications (Hematological):    iron polysaccharides (NIFEREX) 150 MG capsule, TAKE 2 CAPSULES BY MOUTH EVERY DAY  Current Outpatient Medications (Other):    Calcium 1500 MG tablet, Take 1,500 mg by mouth daily.   citalopram (CELEXA) 40 MG tablet, Take 1 tablet (40 mg total) by mouth daily.   fluconazole (DIFLUCAN) 150 MG tablet, 1 tab by mouth every 3 days as needed   multivitamin (THERAGRAN) tablet, Take 1 tablet by mouth daily. Centrum Chew.   pantoprazole (PROTONIX) 40 MG tablet, Take 1 tablet (40 mg total) by mouth daily.   Reviewed prior external information including notes and imaging from  primary care provider As well as notes that were available from care everywhere and other healthcare systems.  Past medical history, social, surgical and family history all reviewed in electronic medical record.  No pertanent information unless stated regarding to the chief complaint.   Review of Systems:  No headache, visual changes, nausea, vomiting, diarrhea, constipation, dizziness, abdominal pain, skin rash, fevers, chills, night sweats, weight loss, swollen lymph nodes, body aches, joint swelling, chest pain, shortness of breath, mood changes. POSITIVE muscle aches  Objective  Blood pressure 110/62, pulse 80, height 5\' 6"  (1.676 m), weight 190 lb (86.2 kg), last menstrual period 09/04/2023, SpO2 97%.   General: No apparent distress alert and oriented x3 mood and affect normal, dressed  appropriately.  HEENT: Pupils equal, extraocular movements intact  Respiratory: Patient's speak in full sentences and does not appear short of breath  Cardiovascular: No lower extremity edema, non tender, no erythema  Left knee exam shows patient does have lateral tracking of the patella noted.  Patient does have positive grind test noted.  No instability of the knee though itself.  Trace effusion noted.     Impression and Recommendations:    The above documentation has been reviewed and is accurate and complete Judi Saa, DO

## 2023-09-23 ENCOUNTER — Encounter: Payer: Self-pay | Admitting: Family Medicine

## 2023-09-23 ENCOUNTER — Ambulatory Visit: Payer: BC Managed Care – PPO | Admitting: Family Medicine

## 2023-09-23 VITALS — BP 110/62 | HR 80 | Ht 66.0 in | Wt 190.0 lb

## 2023-09-23 DIAGNOSIS — M1712 Unilateral primary osteoarthritis, left knee: Secondary | ICD-10-CM | POA: Diagnosis not present

## 2023-09-23 NOTE — Patient Instructions (Signed)
Tru pull lite on L knee Voltaren  OOFOS HOKA recovery sandals Ice See me in 7-8 weeks

## 2023-09-23 NOTE — Assessment & Plan Note (Signed)
Patient has been doing relatively well overall.  Discussed with patient that the quick this is a little exacerbation to get a new brace with her seeming to not give the stability that she was used to.  We discussed the importance of the VMO strengthening as well.  We discussed which activities to do and which ones to avoid.  Increasing activity slowly.  Follow-up with me again in 6 to 8 weeks worsening pain will consider the possibility of an injection

## 2023-11-08 NOTE — Progress Notes (Unsigned)
[facility] [address] [place] [address] Phone: [phone] Subjective:   Katherine Tucker, am serving as a scribe for Dr. Antoine Primas.  I'm seeing this patient by the request  of:  [name], MD  CC:   BJY:[id number]  09/23/2023 Patient has been doing relatively well overall. Discussed with patient that the quick this is a little exacerbation to get a new brace with her seeming to not give the stability that she was used to. We discussed the importance of the VMO strengthening as well. We discussed which activities to do and which ones to avoid. Increasing activity slowly. Follow-up with me again in 6 to 8 weeks worsening pain will consider the possibility of an injection   Updated 11/11/2023 Katherine Tucker is a 52 y.o. female coming in with complaint of L knee pain. L shoulder pain is new on the anterior side. Started around the beginning of Nov. Knee is doing okay. Voltaren has helped a bunch.       Past Medical History:  Diagnosis Date   Anxiety    Chicken pox    Depression    Elevated prolactin level    elevated in 2009. MRI brain normal   GERD (gastroesophageal reflux disease)    Headache(784.0)    Hiatal hernia    Hyperlipidemia    Hypertension    IDA (iron deficiency anemia)    Mammary duct ectasia of right breast 2009   excision of duct - Dr. Davina Poke 2010 - nonmalignant path   Morbid obesity (HCC)    Sleep apnea with use of continuous positive airway pressure (CPAP)    Past Surgical History:  Procedure Laterality Date   BLADDER SURGERY     childhood   breast lesion excison Right Jan 2010   Dr. Luisa Hart. 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 Remains active and in touch with her sorority sisters. Her mother lives in [place] and they are close.          Social Determinants of Health   Financial Resource Strain: Not on file  Food Insecurity: Not on file  Transportation Needs: Not on file  Physical Activity: Not on file  Stress: Not on file  Social Connections: Not on file   Allergies  Allergen Reactions   Contrast Media [Iodinated Contrast Media] Nausea And Vomiting    Severe nausea and vomiting   Family History  Problem Relation Age of Onset   Arthritis Mother    Hypertension Mother  Hyperlipidemia Mother    Diabetes Mother    Hypertension Father    Hyperlipidemia Father    Prostate cancer Father    Diabetes Father    Breast cancer Paternal Aunt    Diabetes Paternal Grandmother    Colon cancer Neg Hx    Esophageal cancer Neg Hx    Rectal cancer Neg Hx    Stomach cancer Neg Hx      Current Outpatient Medications (Cardiovascular):    metoprolol tartrate (LOPRESSOR) 25 MG tablet, TAKE 1 TABLET BY MOUTH TWICE A DAY   rosuvastatin (CRESTOR) 10 MG tablet, Take 1 tablet (10 mg total) by mouth daily.   Current Outpatient Medications (Analgesics):    meloxicam (MOBIC) 15 MG tablet, Take 1 tablet (15 mg total) by mouth daily.   CVS ASPIRIN LOW DOSE 81 MG EC tablet, TAKE 1 TABLET (81 MG TOTAL) BY MOUTH DAILY. SWALLOW WHOLE.  Current Outpatient Medications (Hematological):    iron  polysaccharides (NIFEREX) 150 MG capsule, TAKE 2 CAPSULES BY MOUTH EVERY DAY  Current Outpatient Medications (Other):    Calcium 1500 MG tablet, Take 1,500 mg by mouth daily.   citalopram (CELEXA) 40 MG tablet, Take 1 tablet (40 mg total) by mouth daily.   fluconazole (DIFLUCAN) 150 MG tablet, 1 tab by mouth every 3 days as needed   multivitamin (THERAGRAN) tablet, Take 1 tablet by mouth daily. Centrum Chew.   pantoprazole (PROTONIX) 40 MG tablet, Take 1 tablet (40 mg total) by mouth daily.   Reviewed prior external information including notes and imaging from  primary care provider As well as notes that were available from care everywhere and other healthcare systems.  Past medical history, social, surgical and family history all reviewed in electronic medical record.  No pertanent information unless stated regarding to the chief complaint.   Review of Systems:  No headache, visual changes, nausea, vomiting, diarrhea, constipation, dizziness, abdominal pain, skin rash, fevers, chills, night sweats, weight loss, swollen lymph nodes, body aches, joint swelling, chest pain, shortness of breath, mood changes. POSITIVE muscle aches  Objective  Blood pressure 110/80, pulse 87, height 5\' 6"  (1.676 m), SpO2 97%.   General: No apparent distress alert and oriented x3 mood and affect normal, dressed appropriately.  HEENT: Pupils equal, extraocular movements intact  Respiratory: Patient's speak in full sentences and does not appear short of breath  Cardiovascular: No lower extremity edema, non tender, no erythema  Left arm shows the patient does have mild positive impingement.  Positive Speed and Jurgenson sign noted.  Rotator cuff strength is intact.  [id number]; 15 additional minutes spent for Therapeutic exercises as stated in above notes.  This included exercises focusing on stretching, strengthening, with significant focus on eccentric aspects.   Long term goals include an improvement in range of  motion, strength, endurance as well as avoiding reinjury. Patient's frequency would include in 1-2 times a day, 3-5 times a week for a duration of 6-12 weeks. Shoulder Exercises that included:  Basic scapular stabilization to include adduction and depression of scapula Scaption, focusing on proper movement and good control Internal and External rotation utilizing a theraband, with elbow tucked at side entire time Rows with theraband    Proper technique shown and discussed handout in great detail with ATC.  All questions were discussed and answered.     Impression and Recommendations:      The above documentation has been reviewed and is accurate and complete Judi Saa, DO

## 2023-11-11 ENCOUNTER — Encounter: Payer: Self-pay | Admitting: Family Medicine

## 2023-11-11 ENCOUNTER — Ambulatory Visit: Payer: BC Managed Care – PPO | Admitting: Family Medicine

## 2023-11-11 VITALS — BP 110/80 | HR 87 | Ht 66.0 in

## 2023-11-11 DIAGNOSIS — M7522 Bicipital tendinitis, left shoulder: Secondary | ICD-10-CM | POA: Diagnosis not present

## 2023-11-11 MED ORDER — MELOXICAM 15 MG PO TABS: 15.0000 mg | ORAL_TABLET | Freq: Every day | ORAL | 0 refills | Status: DC

## 2023-11-11 NOTE — Assessment & Plan Note (Signed)
Left bicep tendinitis noted.  Discussed icing regimen and home exercises.  Discussed compression sleeve, meloxicam prescribed 15 mg for the next 10 days then as needed., increase activity slowly.  Worsening pain will consider injection and further imaging.  Follow-up again in 6 to 8 weeks

## 2023-11-11 NOTE — Patient Instructions (Addendum)
Bicep tendonitis Do prescribed exercises at least 3x a week Meloxicam 15mg  for 10 days Ice and Voltaren Compression sleeve See you again in 2 months

## 2023-12-06 ENCOUNTER — Encounter: Payer: Self-pay | Admitting: Physician Assistant

## 2024-02-21 ENCOUNTER — Other Ambulatory Visit: Payer: Self-pay | Admitting: Family Medicine

## 2024-07-06 ENCOUNTER — Encounter: Payer: Self-pay | Admitting: Physician Assistant

## 2024-07-07 ENCOUNTER — Encounter: Payer: Self-pay | Admitting: Internal Medicine

## 2024-07-07 ENCOUNTER — Ambulatory Visit (INDEPENDENT_AMBULATORY_CARE_PROVIDER_SITE_OTHER): Admitting: Internal Medicine

## 2024-07-07 VITALS — BP 98/68 | Ht 66.0 in | Wt 186.8 lb

## 2024-07-07 DIAGNOSIS — I10 Essential (primary) hypertension: Secondary | ICD-10-CM

## 2024-07-07 DIAGNOSIS — R739 Hyperglycemia, unspecified: Secondary | ICD-10-CM | POA: Diagnosis not present

## 2024-07-07 DIAGNOSIS — G43909 Migraine, unspecified, not intractable, without status migrainosus: Secondary | ICD-10-CM | POA: Diagnosis not present

## 2024-07-07 DIAGNOSIS — E559 Vitamin D deficiency, unspecified: Secondary | ICD-10-CM

## 2024-07-07 MED ORDER — KETOROLAC TROMETHAMINE 30 MG/ML IJ SOLN
30.0000 mg | Freq: Once | INTRAMUSCULAR | Status: AC
  Administered 2024-07-07: 30 mg via INTRAMUSCULAR

## 2024-07-07 MED ORDER — NURTEC 75 MG PO TBDP: ORAL_TABLET | ORAL | 11 refills | Status: AC

## 2024-07-07 NOTE — Patient Instructions (Addendum)
 We need to check your BP before leaving today  You had the pain shot today - Toradol 30 mg  Please take all new medication as prescribed - the Nurtec samples that are meant to be used 1 every other day, or an extra one if needed  You can still take the naproxyn intermittently for headache, just not on a regular basis  Please continue all other medications as before, and refills have been done if requested.  Please have the pharmacy call with any other refills you may need.  Please keep your appointments with your specialists as you may have planned

## 2024-07-07 NOTE — Progress Notes (Signed)
 Patient ID: Katherine Tucker, female   DOB: Oct 05, 1971, 53 y.o.   MRN: [id number]        Chief Complaint: follow up daily HA x 2 wks, low vit d, hyperglycemia, htn       HPI:  Katherine Tucker is a 53 y.o. female here with c/o bitemporal constant hard HA with some radiation to the crown and even the post head and neck, without trauma, rash, swelling.  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  Naproxyn resolves this, but does not want to take this long term, Pt denies chest pain, increased sob or doe, wheezing, orthopnea, PND, increased LE swelling, palpitations, dizziness or syncope.   Pt denies polydipsia, polyuria, or new focal neuro s/s.          Wt Readings from Last 3 Encounters:  07/07/24 186 lb 12.8 oz (84.7 kg)  09/23/23 190 lb (86.2 kg)  09/06/23 190 lb (86.2 kg)   BP Readings from Last 3 Encounters:  07/07/24 98/68  11/11/23 110/80  09/23/23 110/62         Past Medical History:  Diagnosis Date   Anxiety    Chicken pox    Depression    Elevated prolactin level    elevated in 2009. MRI brain normal   GERD (gastroesophageal reflux disease)    Headache(784.0)    Hiatal hernia    Hyperlipidemia    Hypertension    IDA (iron  deficiency anemia)    Mammary duct ectasia of right breast 2009   excision of duct - Dr. Ananias 2010 - nonmalignant path   Morbid obesity (HCC)    Sleep apnea with use of continuous positive airway pressure (CPAP)    Past Surgical History:  Procedure Laterality Date   BLADDER SURGERY     childhood   breast lesion excison Right Jan 2010   Dr. Vanderbilt. Ductal ectasia   lapband procedure  5-11   [facility]   MOUTH SURGERY      reports that she has never smoked. She has never used smokeless tobacco. She reports current alcohol use. She reports that she does not use drugs. family history includes Arthritis in her mother; Breast cancer in her paternal aunt; Diabetes in her father, mother, and  paternal grandmother; Hyperlipidemia in her father and mother; Hypertension in her father and mother; Prostate cancer in her father. Allergies  Allergen Reactions   Contrast Media [Iodinated Contrast Media] Nausea And Vomiting    Severe nausea and vomiting   Current Outpatient Medications on File Prior to Visit  Medication Sig Dispense Refill   Calcium  1500 MG tablet Take 1,500 mg by mouth daily.     citalopram  (CELEXA ) 40 MG tablet Take 1 tablet (40 mg total) by mouth daily. 90 tablet 3   [facility] ASPIRIN  LOW DOSE 81 MG EC tablet TAKE 1 TABLET (81 MG TOTAL) BY MOUTH DAILY. SWALLOW WHOLE. 90 tablet 1   fluconazole  (DIFLUCAN ) 150 MG tablet 1 tab by mouth every 3 days as needed 2 tablet 1   iron  polysaccharides (NIFEREX) 150 MG capsule TAKE 2 CAPSULES BY MOUTH EVERY DAY 180 capsule 3   meloxicam  (MOBIC ) 15 MG tablet TAKE 1 TABLET (15 MG TOTAL) BY MOUTH DAILY. 30 tablet 0   metoprolol  tartrate (LOPRESSOR ) 25 MG tablet TAKE 1 TABLET BY MOUTH TWICE A DAY 180 tablet 3   multivitamin (THERAGRAN) tablet Take 1 tablet by mouth daily. Centrum Chew.     pantoprazole  (PROTONIX )  40 MG tablet Take 1 tablet (40 mg total) by mouth daily. 90 tablet 3   rosuvastatin  (CRESTOR ) 10 MG tablet Take 1 tablet (10 mg total) by mouth daily. 90 tablet 3   No current facility-administered medications on file prior to visit.        ROS:  All others reviewed and negative.  Objective        PE:  BP 98/68   Ht 5' 6 (1.676 m)   Wt 186 lb 12.8 oz (84.7 kg)   BMI 30.15 kg/m                 Constitutional: Pt appears in NAD               HENT: Head: NCAT.                Right Ear: External ear normal.                 Left Ear: External ear normal.                Eyes: . Pupils are equal, round, and reactive to light. Conjunctivae and EOM are normal               Nose: without d/c or deformity               Neck: Neck supple. Gross normal ROM               Cardiovascular: Normal rate and regular rhythm.                  Pulmonary/Chest: Effort normal and breath sounds without rales or wheezing.                Abd:  Soft, NT, ND, + BS, no organomegaly               Neurological: Pt is alert. At baseline orientation, motor grossly intact, cn 2-12 intact               Skin: Skin is warm. No rashes, no other new lesions, LE edema - none               Psychiatric: Pt behavior is normal without agitation   Micro: none  Cardiac tracings I have personally interpreted today:  none  Pertinent Radiological findings (summarize): none   Lab Results  Component Value Date   WBC 3.3 (L) 09/06/2023   HGB 12.4 09/06/2023   HCT 38.2 09/06/2023   PLT 199.0 09/06/2023   GLUCOSE 84 09/06/2023   CHOL 187 09/06/2023   TRIG 37.0 09/06/2023   HDL 85.20 09/06/2023   LDLCALC 95 09/06/2023   ALT 13 09/06/2023   AST 26 09/06/2023   NA 138 09/06/2023   K 4.3 09/06/2023   CL 103 09/06/2023   CREATININE 1.07 09/06/2023   BUN 17 09/06/2023   CO2 29 09/06/2023   TSH 1.09 09/06/2023   HGBA1C 5.4 09/06/2023   Assessment/Plan:  Katherine Tucker is a 53 y.o. Black or African American [2] female with  has a past medical history of Anxiety, Chicken pox, Depression, Elevated prolactin level, GERD (gastroesophageal reflux disease), Headache(784.0), Hiatal hernia, Hyperlipidemia, Hypertension, IDA (iron  deficiency anemia), Mammary duct ectasia of right breast (2009), Morbid obesity (HCC), and Sleep apnea with use of continuous positive airway pressure (CPAP).  Vitamin D  deficiency Last vitamin D  Lab Results  Component Value Date   VD25OH 35.72 09/06/2023   Low, to  start oral replacement   Hyperglycemia Lab Results  Component Value Date   HGBA1C 5.4 09/06/2023   Stable, pt to continue current medical treatment  - diet, wt control   Essential hypertension BP Readings from Last 3 Encounters:  07/07/24 98/68  11/11/23 110/80  09/23/23 110/62   Stable, pt to continue medical treatment lopressor  25 bid,    Acute  migraine Atypical but appaers to be now transformed HA, has pain now - for toradol 30 mg IM, also start nurtec 75 every other day, and naproxyn prn  Followup: Return if symptoms worsen or fail to improve.  [name], MD 07/07/2024 7:17 PM [facility] Internal Medicine

## 2024-07-07 NOTE — Assessment & Plan Note (Signed)
 Last vitamin D  Lab Results  Component Value Date   VD25OH 35.72 09/06/2023   Low, to start oral replacement

## 2024-07-07 NOTE — Assessment & Plan Note (Signed)
 BP Readings from Last 3 Encounters:  07/07/24 98/68  11/11/23 110/80  09/23/23 110/62   Stable, pt to continue medical treatment lopressor  25 bid,

## 2024-07-07 NOTE — Assessment & Plan Note (Signed)
 Atypical but appaers to be now transformed HA, has pain now - for toradol 30 mg IM, also start nurtec 75 every other day, and naproxyn prn

## 2024-07-07 NOTE — Assessment & Plan Note (Signed)
 Lab Results  Component Value Date   HGBA1C 5.4 09/06/2023   Stable, pt to continue current medical treatment  - diet, wt control

## 2024-08-01 ENCOUNTER — Telehealth: Payer: Self-pay

## 2024-08-01 ENCOUNTER — Encounter: Payer: Self-pay | Admitting: Sleep Medicine

## 2024-08-01 ENCOUNTER — Ambulatory Visit (INDEPENDENT_AMBULATORY_CARE_PROVIDER_SITE_OTHER): Admitting: Sleep Medicine

## 2024-08-01 VITALS — BP 110/70 | HR 71 | Temp 97.5°F | Ht 66.0 in | Wt 188.8 lb

## 2024-08-01 DIAGNOSIS — G4730 Sleep apnea, unspecified: Secondary | ICD-10-CM

## 2024-08-01 DIAGNOSIS — I10 Essential (primary) hypertension: Secondary | ICD-10-CM

## 2024-08-01 DIAGNOSIS — G4733 Obstructive sleep apnea (adult) (pediatric): Secondary | ICD-10-CM

## 2024-08-01 NOTE — Patient Instructions (Signed)
 Katherine Tucker

## 2024-08-01 NOTE — Telephone Encounter (Signed)
 Noted. Nothing further needed.

## 2024-08-01 NOTE — Telephone Encounter (Signed)
 Need to know if she is using her CPAP machine? If so who her durable medical equipment company and can she bring the CPAP to her appt.  LMTCB. E2C2 please advise when patient calls back.

## 2024-08-01 NOTE — Progress Notes (Unsigned)
 Name:Katherine Tucker MRN: [id number] DOB: 02/18/1971   CHIEF COMPLAINT:  REASSESSMENT OF OSA   HISTORY OF PRESENT ILLNESS: Ms. Zieske is a 53 y.o. w/ a h/o OSA, HTN, GERD, depression, migraine headaches, hyperlipidemia and obesity who presents for reassessment of OSA. Reports that she was initially diagnosed with OSA several years ago and was subsequently started on CPAP therapy. States that her CPAP device malfunctioned last week and has been without it. Report loud snoring, gasping and excessive daytime sleepiness. Reports nocturnal awakenings due to unclear reasons or due to nocturia, however does not have difficulty falling back to sleep. Reports a 100 lb weight loss over the last few years. Admits to morning headaches. Denies RLS symptoms, dream enactment, cataplexy, hypnagogic or hypnapompic hallucinations. Reports a family history of sleep apnea. Denies drowsy driving. Drinks 8-12 ounces of coffee and 1 cup of tea daily, occasional alcohol use, denies tobacco or illicit drug use.   Bedtime 10 pm Sleep onset 20 mins Rise time 5:30-6 am   EPWORTH SLEEP SCORE 11    08/01/2024    3:20 PM  Results of the Epworth flowsheet  Sitting and reading 3  Watching TV 3  Sitting, inactive in a public place (e.g. a theatre or a meeting) 0  As a passenger in a car for an hour without a break 0  Lying down to rest in the afternoon when circumstances permit 3  Sitting and talking to someone 0  Sitting quietly after a lunch without alcohol 2  In a car, while stopped for a few minutes in traffic 0  Total score 11     PAST MEDICAL HISTORY :   has a past medical history of Anxiety, Chicken pox, Depression, Elevated prolactin level, GERD (gastroesophageal reflux disease), Headache(784.0), Hiatal hernia, Hyperlipidemia, Hypertension, IDA (iron  deficiency anemia), Mammary duct ectasia of right breast (2009), Morbid obesity (HCC), and Sleep apnea with use of continuous positive airway  pressure (CPAP).  has a past surgical history that includes Bladder surgery; lapband procedure (5-11); breast lesion excison (Right, Jan 2010); and Mouth surgery. Prior to Admission medications   Medication Sig Start Date End Date Taking? Authorizing Provider  Calcium  1500 MG tablet Take 1,500 mg by mouth daily.   Yes [provider]  citalopram  (CELEXA ) 40 MG tablet Take 1 tablet (40 mg total) by mouth daily. 09/06/23  Yes Norleen Lynwood ORN, MD  [facility] ASPIRIN  LOW DOSE 81 MG EC tablet TAKE 1 TABLET (81 MG TOTAL) BY MOUTH DAILY. SWALLOW WHOLE. 12/27/18  Yes Norleen Lynwood ORN, MD  fluconazole  (DIFLUCAN ) 150 MG tablet 1 tab by mouth every 3 days as needed 07/12/20  Yes Norleen Lynwood ORN, MD  iron  polysaccharides (NIFEREX) 150 MG capsule TAKE 2 CAPSULES BY MOUTH EVERY DAY 09/06/23  Yes Norleen Lynwood ORN, MD  meloxicam  (MOBIC ) 15 MG tablet TAKE 1 TABLET (15 MG TOTAL) BY MOUTH DAILY. 02/22/24  Yes Claudene Arthea HERO, DO  metoprolol  tartrate (LOPRESSOR ) 25 MG tablet TAKE 1 TABLET BY MOUTH TWICE A DAY 09/06/23  Yes Norleen Lynwood ORN, MD  multivitamin [facility]) tablet Take 1 tablet by mouth daily. Centrum Chew.   Yes [provider]  pantoprazole  (PROTONIX ) 40 MG tablet Take 1 tablet (40 mg total) by mouth daily. 09/06/23  Yes Norleen Lynwood ORN, MD  Rimegepant Sulfate (NURTEC) 75 MG TBDP 1 tab by mouth once every other day for prevention 07/07/24  Yes Norleen Lynwood ORN, MD  rosuvastatin  (CRESTOR ) 10 MG tablet Take 1  tablet (10 mg total) by mouth daily. 09/06/23  Yes Norleen Lynwood ORN, MD   Allergies  Allergen Reactions   Contrast Media [Iodinated Contrast Media] Nausea And Vomiting    Severe nausea and vomiting    FAMILY HISTORY:  family history includes Arthritis in her mother; Breast cancer in her paternal aunt; Diabetes in her father, mother, and paternal grandmother; Hyperlipidemia in her father and mother; Hypertension in her father and mother; Prostate cancer in her father. SOCIAL HISTORY:  reports that she has never  smoked. She has never used smokeless tobacco. She reports current alcohol use. She reports that she does not use drugs.   Review of Systems:  Gen:  Denies  fever, sweats, chills weight loss  HEENT: Denies blurred vision, double vision, ear pain, eye pain, hearing loss, nose bleeds, sore throat Cardiac:  No dizziness, chest pain or heaviness, chest tightness,edema, No JVD Resp:   No cough, -sputum production, -shortness of breath,-wheezing, -hemoptysis,  Gi: Denies swallowing difficulty, stomach pain, nausea or vomiting, diarrhea, constipation, bowel incontinence Gu:  Denies bladder incontinence, burning urine Ext:   Denies Joint pain, stiffness or swelling Skin: Denies  skin rash, easy bruising or bleeding or hives Endoc:  Denies polyuria, polydipsia , polyphagia or weight change Psych:   Denies depression, insomnia or hallucinations  Other:  All other systems negative  VITAL SIGNS: BP 110/70   Pulse 71   Temp (!) 97.5 F (36.4 C)   Ht 5' 6 (1.676 m)   Wt 188 lb 12.8 oz (85.6 kg)   SpO2 98%   BMI 30.47 kg/m    Physical Examination:   General Appearance: No distress  EYES PERRLA, EOM intact.   NECK Supple, No JVD Pulmonary: normal breath sounds, No wheezing.  CardiovascularNormal S1,S2.  No m/r/g.   Abdomen: Benign, Soft, non-tender. Skin:   warm, no rashes, no ecchymosis  Extremities: normal, no cyanosis, clubbing. Neuro:without focal findings,  speech normal  PSYCHIATRIC: Mood, affect within normal limits.   ASSESSMENT AND PLAN  OSA Will reassess apnea with HST and follow up to review results. Will complete order for a replacement CPAP device after reviewing results. Due to patient being without a CPAP device currently, also completed order for a travel CPAP. Discussed the consequences of untreated sleep apnea. Advised not to drive drowsy for safety of patient and others. Will follow up in 3 months.     HTN Stable, on current management. Following with PCP.     MEDICATION ADJUSTMENTS/LABS AND TESTS ORDERED: Recommend Sleep Study   Patient  satisfied with Plan of action and management. All questions answered  Follow up to review HST results and treatment plan.   I spent a total of 57 minutes reviewing chart data, face-to-face evaluation with the patient, counseling and coordination of care as detailed above.    Mattison Golay, M.D.  Sleep Medicine [facility]

## 2024-08-01 NOTE — Telephone Encounter (Signed)
 Copied from CRM #[id number]. Topic: Clinical - Order For Equipment >> Aug 01, 2024  8:56 AM Leila BROCKS wrote: Reason for CRM: Patient [phone] is returning Doroteo Nickolson's call. Patient informed 08/01/24 telephone call. Patient states is using cpap machine, and the cpap machine is broken and over 51 years old. Patient is using [employer] company. Patient will bring cpap machine for office visit today.

## 2024-09-04 ENCOUNTER — Encounter: Admitting: Internal Medicine

## 2024-09-05 ENCOUNTER — Encounter: Payer: BC Managed Care – PPO | Admitting: Internal Medicine

## 2024-09-17 ENCOUNTER — Other Ambulatory Visit: Payer: Self-pay | Admitting: Internal Medicine

## 2024-09-22 ENCOUNTER — Other Ambulatory Visit: Payer: Self-pay | Admitting: Student

## 2024-09-22 DIAGNOSIS — Z9884 Bariatric surgery status: Secondary | ICD-10-CM

## 2024-09-25 ENCOUNTER — Ambulatory Visit (INDEPENDENT_AMBULATORY_CARE_PROVIDER_SITE_OTHER): Admitting: Internal Medicine

## 2024-09-25 ENCOUNTER — Encounter: Payer: Self-pay | Admitting: Internal Medicine

## 2024-09-25 VITALS — BP 122/76 | HR 60 | Temp 98.5°F | Ht 66.0 in | Wt 187.0 lb

## 2024-09-25 DIAGNOSIS — E538 Deficiency of other specified B group vitamins: Secondary | ICD-10-CM

## 2024-09-25 DIAGNOSIS — Z23 Encounter for immunization: Secondary | ICD-10-CM | POA: Diagnosis not present

## 2024-09-25 DIAGNOSIS — Z0000 Encounter for general adult medical examination without abnormal findings: Secondary | ICD-10-CM

## 2024-09-25 DIAGNOSIS — R739 Hyperglycemia, unspecified: Secondary | ICD-10-CM | POA: Diagnosis not present

## 2024-09-25 DIAGNOSIS — D508 Other iron deficiency anemias: Secondary | ICD-10-CM | POA: Diagnosis not present

## 2024-09-25 DIAGNOSIS — Z0001 Encounter for general adult medical examination with abnormal findings: Secondary | ICD-10-CM

## 2024-09-25 DIAGNOSIS — G43909 Migraine, unspecified, not intractable, without status migrainosus: Secondary | ICD-10-CM | POA: Insufficient documentation

## 2024-09-25 DIAGNOSIS — E559 Vitamin D deficiency, unspecified: Secondary | ICD-10-CM

## 2024-09-25 DIAGNOSIS — E7800 Pure hypercholesterolemia, unspecified: Secondary | ICD-10-CM

## 2024-09-25 DIAGNOSIS — I10 Essential (primary) hypertension: Secondary | ICD-10-CM

## 2024-09-25 DIAGNOSIS — G43809 Other migraine, not intractable, without status migrainosus: Secondary | ICD-10-CM

## 2024-09-25 MED ORDER — METOPROLOL TARTRATE 25 MG PO TABS: 25.0000 mg | ORAL_TABLET | Freq: Two times a day (BID) | ORAL | 3 refills | Status: AC

## 2024-09-25 MED ORDER — POLYSACCHARIDE IRON COMPLEX 150 MG PO CAPS: ORAL_CAPSULE | ORAL | 3 refills | Status: AC

## 2024-09-25 MED ORDER — PANTOPRAZOLE SODIUM 40 MG PO TBEC: 40.0000 mg | DELAYED_RELEASE_TABLET | Freq: Every day | ORAL | 3 refills | Status: AC

## 2024-09-25 MED ORDER — ROSUVASTATIN CALCIUM 10 MG PO TABS: 10.0000 mg | ORAL_TABLET | Freq: Every day | ORAL | 3 refills | Status: AC

## 2024-09-25 MED ORDER — CITALOPRAM HYDROBROMIDE 40 MG PO TABS: 40.0000 mg | ORAL_TABLET | Freq: Every day | ORAL | 3 refills | Status: AC

## 2024-09-25 NOTE — Assessment & Plan Note (Signed)
 Much improved with every other day nurtec 75mg 

## 2024-09-25 NOTE — Assessment & Plan Note (Signed)
 Last vitamin D  Lab Results  Component Value Date   VD25OH 35.72 09/06/2023   Low, to start oral replacement

## 2024-09-25 NOTE — Assessment & Plan Note (Signed)
 With mildly heavy menses, for f/u iron  and cbc with labs

## 2024-09-25 NOTE — Assessment & Plan Note (Signed)
 Lab Results  Component Value Date   HGBA1C 5.4 09/06/2023   Stable, pt to continue current medical treatment  - diet, wt control

## 2024-09-25 NOTE — Progress Notes (Signed)
 Patient ID: Katherine Tucker, female   DOB: May 18, 1971, 53 y.o.   MRN: [id number]         Chief Complaint:: wellness exam and migraine, iron  deficiency, low vit d, hld, hyperglycemia, htn       HPI:  Katherine Tucker is a 53 y.o. female here for wellness exam; for tdap, covid, hep b, prevnar and shingrix at pharmacy per pt, up to date with GYN pap and mammogram, for flu shot today, o/w up to date                        Also migraines much improved down to 1-2 per month with preventive nurtec.  Has ongoing iron  deficiency and mildly heavy menses, pt asking for iron  infusion if needed.  Pt denies chest pain, increased sob or doe, wheezing, orthopnea, PND, increased LE swelling, palpitations, dizziness or syncope.   Pt denies polydipsia, polyuria, or new focal neuro s/s.    Pt denies fever, wt loss, night sweats, loss of appetite, or other constitutional symptoms  Saw her bariatric surgeon and doing ok recently.     Wt Readings from Last 3 Encounters:  09/25/24 187 lb (84.8 kg)  08/01/24 188 lb 12.8 oz (85.6 kg)  07/07/24 186 lb 12.8 oz (84.7 kg)   BP Readings from Last 3 Encounters:  09/25/24 122/76  08/01/24 110/70  07/07/24 98/68   Immunization History  Administered Date(s) Administered   Influenza Split 09/06/2014   Influenza, Seasonal, Injecte, Preservative Fre 09/06/2023, 09/25/2024   Influenza,inj,Quad PF,6+ Mos 11/04/2017, 11/08/2018, 09/23/2020, 08/25/2022   Moderna Covid-19 Fall Seasonal Vaccine 51yrs & older 09/22/2023   PFIZER(Purple Top)SARS-COV-2 Vaccination 01/26/2020, 02/20/2020, 09/23/2020   Tdap 03/29/2013   Health Maintenance Due  Topic Date Due   Hepatitis B Vaccines 19-59 Average Risk (1 of 3 - 19+ 3-dose series) Never done   Cervical Cancer Screening (HPV/Pap Cotest)  03/13/2016   DTaP/Tdap/Td (2 - Td or Tdap) 03/30/2023   Pneumococcal Vaccine: 50+ Years (1 of 1 - PCV) Never done   Zoster Vaccines- Shingrix (1 of 2) Never done   COVID-19 Vaccine (5 - 2025-26  season) 08/07/2024      Past Medical History:  Diagnosis Date   Anxiety    Chicken pox    Depression    Elevated prolactin level    elevated in 2009. MRI brain normal   GERD (gastroesophageal reflux disease)    Headache(784.0)    Hiatal hernia    Hyperlipidemia    Hypertension    IDA (iron  deficiency anemia)    Mammary duct ectasia of right breast 2009   excision of duct - Dr. Ananias 2010 - nonmalignant path   Morbid obesity (HCC)    Sleep apnea with use of continuous positive airway pressure (CPAP)    Past Surgical History:  Procedure Laterality Date   BLADDER SURGERY     childhood   breast lesion excison Right Jan 2010   Dr. Vanderbilt. Ductal ectasia   lapband procedure  5-11   [facility]   MOUTH SURGERY      reports that she has never smoked. She has never used smokeless tobacco. She reports current alcohol use. She reports that she does not use drugs. family history includes Arthritis in her mother; Breast cancer in her paternal aunt; Diabetes in her father, mother, and paternal grandmother; Hyperlipidemia in her father and mother; Hypertension in her father and mother; Prostate cancer in her father. Allergies  Allergen Reactions  Contrast Media [Iodinated Contrast Media] Nausea And Vomiting    Severe nausea and vomiting   Current Outpatient Medications on File Prior to Visit  Medication Sig Dispense Refill   Calcium  1500 MG tablet Take 1,500 mg by mouth daily.     [facility] ASPIRIN  LOW DOSE 81 MG EC tablet TAKE 1 TABLET (81 MG TOTAL) BY MOUTH DAILY. SWALLOW WHOLE. 90 tablet 1   meloxicam  (MOBIC ) 15 MG tablet TAKE 1 TABLET (15 MG TOTAL) BY MOUTH DAILY. 30 tablet 0   Multiple Vitamins-Minerals (ONCOVITE) TABS Take 1 tablet by mouth.     multivitamin (THERAGRAN) tablet Take 1 tablet by mouth daily. Centrum Chew.     Rimegepant Sulfate (NURTEC) 75 MG TBDP 1 tab by mouth once every other day for prevention 16 tablet 11   No current facility-administered medications on file  prior to visit.        ROS:  All others reviewed and negative.  Objective        PE:  BP 122/76 (BP Location: Right Arm, Patient Position: Sitting, Cuff Size: Normal)   Pulse 60   Temp 98.5 F (36.9 C) (Oral)   Ht 5' 6 (1.676 m)   Wt 187 lb (84.8 kg)   LMP 09/24/2024 (Exact Date)   SpO2 99%   BMI 30.18 kg/m                 Constitutional: Pt appears in NAD               HENT: Head: NCAT.                Right Ear: External ear normal.                 Left Ear: External ear normal.                Eyes: . Pupils are equal, round, and reactive to light. Conjunctivae and EOM are normal               Nose: without d/c or deformity               Neck: Neck supple. Gross normal ROM               Cardiovascular: Normal rate and regular rhythm.                 Pulmonary/Chest: Effort normal and breath sounds without rales or wheezing.                Abd:  Soft, NT, ND, + BS, no organomegaly               Neurological: Pt is alert. At baseline orientation, motor grossly intact               Skin: Skin is warm. No rashes, no other new lesions, LE edema - none               Psychiatric: Pt behavior is normal without agitation   Micro: none  Cardiac tracings I have personally interpreted today:  none  Pertinent Radiological findings (summarize): none   Lab Results  Component Value Date   WBC 3.3 (L) 09/06/2023   HGB 12.4 09/06/2023   HCT 38.2 09/06/2023   PLT 199.0 09/06/2023   GLUCOSE 84 09/06/2023   CHOL 187 09/06/2023   TRIG 37.0 09/06/2023   HDL 85.20 09/06/2023   LDLCALC 95 09/06/2023   ALT 13 09/06/2023   AST 26  09/06/2023   NA 138 09/06/2023   K 4.3 09/06/2023   CL 103 09/06/2023   CREATININE 1.07 09/06/2023   BUN 17 09/06/2023   CO2 29 09/06/2023   TSH 1.09 09/06/2023   HGBA1C 5.4 09/06/2023   Assessment/Plan:  [name] is a 53 y.o. Black or African American [2] female with  has a past medical history of Anxiety, Chicken pox, Depression, Elevated  prolactin level, GERD (gastroesophageal reflux disease), Headache(784.0), Hiatal hernia, Hyperlipidemia, Hypertension, IDA (iron  deficiency anemia), Mammary duct ectasia of right breast (2009), Morbid obesity (HCC), and Sleep apnea with use of continuous positive airway pressure (CPAP).  Encounter for well adult exam with abnormal findings Age and sex appropriate education and counseling updated with regular exercise and diet Referrals for preventative services - none needed Immunizations addressed - declines all here today, except for flu shot Smoking counseling  - none needed Evidence for depression or other mood disorder - anxiety stable Most recent labs reviewed. I have personally reviewed and have noted: 1) the patient's medical and social history 2) The patient's current medications and supplements 3) The patient's height, weight, and BMI have been recorded in the chart   Vitamin D  deficiency Last vitamin D  Lab Results  Component Value Date   VD25OH 35.72 09/06/2023   Low, to start oral replacement   Iron  deficiency anemia With mildly heavy menses, for f/u iron  and cbc with labs  Hyperlipidemia Lab Results  Component Value Date   LDLCALC 95 09/06/2023   Stable, pt to continue current statin crestor  10 mg qd   Hyperglycemia Lab Results  Component Value Date   HGBA1C 5.4 09/06/2023   Stable, pt to continue current medical treatment  - diet, wt control   Essential hypertension BP Readings from Last 3 Encounters:  09/25/24 122/76  08/01/24 110/70  07/07/24 98/68   Stable, pt to continue medical treatment lopressor  25 bid   Migraine Much improved with every other day nurtec 75mg   Followup: Return in about 1 year (around 09/25/2025).  [name], MD 09/25/2024 10:01 AM [facility] Internal Medicine

## 2024-09-25 NOTE — Assessment & Plan Note (Signed)
 Age and sex appropriate education and counseling updated with regular exercise and diet Referrals for preventative services - none needed Immunizations addressed - declines all here today, except for flu shot Smoking counseling  - none needed Evidence for depression or other mood disorder - anxiety stable Most recent labs reviewed. I have personally reviewed and have noted: 1) the patient's medical and social history 2) The patient's current medications and supplements 3) The patient's height, weight, and BMI have been recorded in the chart

## 2024-09-25 NOTE — Assessment & Plan Note (Signed)
 BP Readings from Last 3 Encounters:  09/25/24 122/76  08/01/24 110/70  07/07/24 98/68   Stable, pt to continue medical treatment lopressor  25 bid

## 2024-09-25 NOTE — Assessment & Plan Note (Signed)
 Lab Results  Component Value Date   LDLCALC 95 09/06/2023   Stable, pt to continue current statin crestor  10 mg qd

## 2024-09-25 NOTE — Patient Instructions (Signed)

## 2024-09-26 ENCOUNTER — Telehealth: Payer: Self-pay

## 2024-09-26 DIAGNOSIS — G4733 Obstructive sleep apnea (adult) (pediatric): Secondary | ICD-10-CM

## 2024-09-26 NOTE — Telephone Encounter (Signed)
 It doesn't look the order was placed until today. The order has been sent to [facility]

## 2024-09-26 NOTE — Telephone Encounter (Signed)
 Copied from CRM #[id number]. Topic: General - Other >> Sep 26, 2024 11:42 AM Benton O wrote: Reason for CRM: got ordered at home sleep study and they [facility] said they didnt have the order and they said to send to patient or to send to them [facility]  The fax number for [facility] [phone] [email or web address]

## 2024-09-29 NOTE — Telephone Encounter (Signed)
 I have left a detailed message on the patient's secure voicemail notifying her.  Nothing further needed.

## 2024-10-09 ENCOUNTER — Encounter

## 2024-10-09 DIAGNOSIS — G4733 Obstructive sleep apnea (adult) (pediatric): Secondary | ICD-10-CM

## 2024-10-12 ENCOUNTER — Inpatient Hospital Stay: Admission: RE | Admit: 2024-10-12 | Source: Ambulatory Visit

## 2024-10-23 ENCOUNTER — Ambulatory Visit: Payer: Self-pay

## 2024-10-23 DIAGNOSIS — G4733 Obstructive sleep apnea (adult) (pediatric): Secondary | ICD-10-CM

## 2024-10-23 DIAGNOSIS — R069 Unspecified abnormalities of breathing: Secondary | ICD-10-CM | POA: Diagnosis not present

## 2024-11-08 ENCOUNTER — Encounter: Admitting: Skilled Nursing Facility1

## 2024-12-14 ENCOUNTER — Inpatient Hospital Stay: Admission: RE | Admit: 2024-12-14 | Source: Ambulatory Visit
